# Patient Record
Sex: Male | Born: 1935 | Race: White | Hispanic: No | Smoking: Never smoker
Health system: Southern US, Community
[De-identification: ages and names within clinical notes are randomized; demographics above are authoritative.]

## PROBLEM LIST (undated history)

## (undated) DIAGNOSIS — Z79899 Other long term (current) drug therapy: Secondary | ICD-10-CM

## (undated) DIAGNOSIS — I495 Sick sinus syndrome: Secondary | ICD-10-CM

## (undated) DIAGNOSIS — I700 Atherosclerosis of aorta: Secondary | ICD-10-CM

## (undated) DIAGNOSIS — M150 Primary generalized (osteo)arthritis: Secondary | ICD-10-CM

## (undated) DIAGNOSIS — Z7901 Long term (current) use of anticoagulants: Secondary | ICD-10-CM

## (undated) DIAGNOSIS — R9439 Abnormal result of other cardiovascular function study: Secondary | ICD-10-CM

## (undated) DIAGNOSIS — E785 Hyperlipidemia, unspecified: Secondary | ICD-10-CM

## (undated) DIAGNOSIS — I483 Typical atrial flutter: Secondary | ICD-10-CM

## (undated) DIAGNOSIS — R42 Dizziness and giddiness: Secondary | ICD-10-CM

## (undated) DIAGNOSIS — M5136 Other intervertebral disc degeneration, lumbar region: Secondary | ICD-10-CM

## (undated) DIAGNOSIS — I2510 Atherosclerotic heart disease of native coronary artery without angina pectoris: Secondary | ICD-10-CM

## (undated) DIAGNOSIS — R7303 Prediabetes: Secondary | ICD-10-CM

## (undated) DIAGNOSIS — I70223 Atherosclerosis of native arteries of extremities with rest pain, bilateral legs: Secondary | ICD-10-CM

## (undated) DIAGNOSIS — I252 Old myocardial infarction: Secondary | ICD-10-CM

## (undated) DIAGNOSIS — K219 Gastro-esophageal reflux disease without esophagitis: Secondary | ICD-10-CM

## (undated) DIAGNOSIS — T50905A Adverse effect of unspecified drugs, medicaments and biological substances, initial encounter: Secondary | ICD-10-CM

## (undated) DIAGNOSIS — Z6831 Body mass index (BMI) 31.0-31.9, adult: Secondary | ICD-10-CM

## (undated) DIAGNOSIS — K56600 Partial intestinal obstruction, unspecified as to cause: Secondary | ICD-10-CM

## (undated) DIAGNOSIS — I10 Essential (primary) hypertension: Secondary | ICD-10-CM

## (undated) DIAGNOSIS — Q245 Malformation of coronary vessels: Secondary | ICD-10-CM

## (undated) DIAGNOSIS — Z01810 Encounter for preprocedural cardiovascular examination: Secondary | ICD-10-CM

## (undated) DIAGNOSIS — I25810 Atherosclerosis of coronary artery bypass graft(s) without angina pectoris: Secondary | ICD-10-CM

## (undated) DIAGNOSIS — I255 Ischemic cardiomyopathy: Secondary | ICD-10-CM

## (undated) DIAGNOSIS — K439 Ventral hernia without obstruction or gangrene: Secondary | ICD-10-CM

## (undated) DIAGNOSIS — Z951 Presence of aortocoronary bypass graft: Secondary | ICD-10-CM

## (undated) DIAGNOSIS — I452 Bifascicular block: Secondary | ICD-10-CM

## (undated) DIAGNOSIS — R931 Abnormal findings on diagnostic imaging of heart and coronary circulation: Secondary | ICD-10-CM

## (undated) DIAGNOSIS — J310 Chronic rhinitis: Secondary | ICD-10-CM

## (undated) DIAGNOSIS — R5381 Other malaise: Secondary | ICD-10-CM

## (undated) DIAGNOSIS — Z950 Presence of cardiac pacemaker: Secondary | ICD-10-CM

## (undated) DIAGNOSIS — K432 Incisional hernia without obstruction or gangrene: Secondary | ICD-10-CM

## (undated) DIAGNOSIS — Z9889 Other specified postprocedural states: Secondary | ICD-10-CM

## (undated) DIAGNOSIS — D6959 Other secondary thrombocytopenia: Secondary | ICD-10-CM

## (undated) DIAGNOSIS — R5383 Other fatigue: Secondary | ICD-10-CM

## (undated) DIAGNOSIS — R972 Elevated prostate specific antigen [PSA]: Secondary | ICD-10-CM

## (undated) HISTORY — DX: Prediabetes: R73.03

## (undated) HISTORY — DX: Malformation of coronary vessels: Q24.5

## (undated) HISTORY — PX: INSERT / REPLACE / REMOVE PACEMAKER: SUR710

## (undated) HISTORY — DX: Partial intestinal obstruction, unspecified as to cause: K56.600

## (undated) HISTORY — DX: Atherosclerosis of coronary artery bypass graft(s) without angina pectoris: I25.810

## (undated) HISTORY — DX: Body mass index (bmi) 31.0-31.9, adult: Z68.31

## (undated) HISTORY — PX: CORONARY ARTERY BYPASS GRAFT: SHX141

## (undated) HISTORY — DX: Old myocardial infarction: I25.2

## (undated) HISTORY — DX: Ventral hernia without obstruction or gangrene: K43.9

## (undated) HISTORY — DX: Atherosclerotic heart disease of native coronary artery without angina pectoris: I25.10

## (undated) HISTORY — PX: ATRIAL FLUTTER ABLATION: SHX5733

## (undated) HISTORY — PX: COLON SURGERY: SHX602

## (undated) HISTORY — DX: Gastro-esophageal reflux disease without esophagitis: K21.9

## (undated) HISTORY — DX: Other malaise: R53.81

## (undated) HISTORY — DX: Other long term (current) drug therapy: Z79.899

## (undated) HISTORY — DX: Encounter for preprocedural cardiovascular examination: Z01.810

## (undated) HISTORY — DX: Atherosclerosis of native arteries of extremities with rest pain, bilateral legs: I70.223

## (undated) HISTORY — DX: Other specified postprocedural states: Z98.890

## (undated) HISTORY — DX: Presence of cardiac pacemaker: Z95.0

## (undated) HISTORY — DX: Dizziness and giddiness: R42

## (undated) HISTORY — DX: Primary generalized (osteo)arthritis: M15.0

## (undated) HISTORY — DX: Long term (current) use of anticoagulants: Z79.01

## (undated) HISTORY — DX: Other secondary thrombocytopenia: D69.59

## (undated) HISTORY — DX: Adverse effect of unspecified drugs, medicaments and biological substances, initial encounter: T50.905A

## (undated) HISTORY — DX: Abnormal result of other cardiovascular function study: R94.39

## (undated) HISTORY — DX: Hyperlipidemia, unspecified: E78.5

## (undated) HISTORY — DX: Elevated prostate specific antigen (PSA): R97.20

## (undated) HISTORY — DX: Chronic rhinitis: J31.0

## (undated) HISTORY — DX: Abnormal findings on diagnostic imaging of heart and coronary circulation: R93.1

## (undated) HISTORY — DX: Atherosclerosis of aorta: I70.0

## (undated) HISTORY — DX: Presence of aortocoronary bypass graft: Z95.1

## (undated) HISTORY — DX: Typical atrial flutter: I48.3

## (undated) HISTORY — DX: Sick sinus syndrome: I49.5

## (undated) HISTORY — DX: Other intervertebral disc degeneration, lumbar region: M51.36

## (undated) HISTORY — DX: Other fatigue: R53.83

## (undated) HISTORY — DX: Bifascicular block: I45.2

## (undated) HISTORY — DX: Incisional hernia without obstruction or gangrene: K43.2

## (undated) HISTORY — DX: Essential (primary) hypertension: I10

---

## 1997-06-26 ENCOUNTER — Ambulatory Visit (HOSPITAL_BASED_OUTPATIENT_CLINIC_OR_DEPARTMENT_OTHER): Admission: RE | Admit: 1997-06-26 | Discharge: 1997-06-26 | Payer: Self-pay | Admitting: *Deleted

## 1997-10-20 ENCOUNTER — Ambulatory Visit (HOSPITAL_BASED_OUTPATIENT_CLINIC_OR_DEPARTMENT_OTHER): Admission: RE | Admit: 1997-10-20 | Discharge: 1997-10-20 | Payer: Self-pay | Admitting: *Deleted

## 2000-04-02 ENCOUNTER — Other Ambulatory Visit: Admission: RE | Admit: 2000-04-02 | Discharge: 2000-04-02 | Payer: Self-pay | Admitting: Urology

## 2005-02-17 ENCOUNTER — Ambulatory Visit: Payer: Self-pay | Admitting: Cardiology

## 2005-02-17 ENCOUNTER — Inpatient Hospital Stay (HOSPITAL_COMMUNITY): Admission: EM | Admit: 2005-02-17 | Discharge: 2005-02-22 | Payer: Self-pay | Admitting: Cardiology

## 2005-04-11 ENCOUNTER — Encounter: Admission: RE | Admit: 2005-04-11 | Discharge: 2005-04-11 | Payer: Self-pay | Admitting: Surgery

## 2006-05-01 ENCOUNTER — Ambulatory Visit: Payer: Self-pay | Admitting: Oncology

## 2010-07-29 NOTE — Discharge Summary (Signed)
NAMESENAI, KINGSLEY NO.:  [id number]   MEDICAL RECORD NO.:  [id number]          PATIENT TYPE:  INP   LOCATION:  2040                         FACILITY:  [facility]   PHYSICIAN:  Evelene Croon, M.D.     DATE OF BIRTH:  30-Jun-1933   DATE OF ADMISSION:  02/17/2005  DATE OF DISCHARGE:                                 DISCHARGE SUMMARY   PRIMARY DIAGNOSIS:  Left main and severe two vessel coronary artery disease  status post acute inferior apical myocardial infarction.   IN-HOSPITAL DIAGNOSIS:  1.  Volume overload.  2.  Hypotension.   SECONDARY DIAGNOSIS:  Gastroesophageal reflux disease.   ALLERGIES:  No known drug allergies.   IN-HOSPITAL OPERATION AND PROCEDURES:  1.  Cardiac catheterization and percutaneous coronary intervention.  2.  Emergent coronary artery bypass grafting x 4 using left internal mammary      artery graft to the left anterior descending coronary, saphenous vein      graft to the diagonal branch of the left anterior descending coronary      artery, sequential saphenous vein graft to the intermediate and obtuse      marginal branches of the left circumflex coronary, endovein harvesting      from the right leg.  3.  Interoperative transesophageal echocardiogram.   HOSPITAL COURSE:  The patient is a 77 year old previously healthy gentleman  who presented with a anterior apical myocardial infarction.  The patient was  taken emergently for cardiac catheterization by Dr. Juanda Chance on February 17, 2005.  Catheterization showed an ulcerated hazy 80% left main stenosis.  The  LAD was heavily diseased proximally and occluded in its proximal and mid  portion.  He underwent immediate angioplasty and stenting of the LAD.  This  lesion was adjacent to one of the diagonal branches and this was occluded by  the stent.  The LAD was successfully opened.  The patient had about 70-80%  left circumflex stenosis and mild narrowing of the proximal right coronary  artery  of about 30%.  Left ventricular ejection fraction was about 35% with  akinesis to dyskinesis of the distal anterior wall and apex.  The patient  remained pain free in the catheterization lab.  Following catheterization,  CVTS was consulted.  Dr. Laneta Simmers discussed with Dr. Juanda Chance and evaluated  films.  After a discussion with the patient about undergoing coronary artery  bypass grafting emergently for the tight stenosis in the left main, the  patient agreed.  Dr. Laneta Simmers discussed the risks and benefits of the patient.  The patient was taken urgently to the operating room where he underwent  emergent coronary artery bypass grafting x 4 using a left internal mammary  artery graft to the left anterior descending coronary, saphenous vein graft  to the diagonal branch of the left anterior descending coronary artery,  sequential saphenous vein graft to the intermediate and obtuse marginal  branches of the left circumflex coronary.  EVH harvesting from the right leg  was done.  The patient tolerated the procedure well and was transferred up  to the intensive care  unit in stable condition.  The patient was extubated  the evening of surgery.  He was seen to be hemodynamically stable  postoperatively.  Following extubation the patient was found to be  neurologically intact.  The patient was hypotensive postoperatively and  required Dopamine as well as neo-drips.  These were weaned off as tolerated  and able to be weaned off completely by postoperative day two.  The  remainder of the patient's postoperative course is pretty much unremarkable.  He did develop volume overload which he was started on diuretics for.  His  last weight was 196.4 pounds on postoperative day four which his  preoperative weight was 190 pounds.  Diuretics will be continued at  discharge.  The patient remained in normal sinus rhythm postoperatively.  He  was out of bed and ambulating well on his own.  He was able to be weaned off   oxygen saturating greater than 90% on room air prior to discharge.  His  incisions are dry, intact, and healing well.  The patient's appetite was  stable, no nausea or vomiting noted.  The patient denies any shortness of  breath postoperatively.  The patient continues to be hemodynamically stable  during hospital stay.  His creatinine was stable.  The patient is  tentatively ready for discharge home postoperative day five, February 22, 2005.   DISCHARGE INSTRUCTIONS:  A follow up appointment is scheduled with Dr.  Laneta Simmers for April 11, 2005, at 11:45 a.m.  The patient will need to obtain  a PA and lateral chest x-ray one hour prior to this appointment.  The  patient is to contact Dr. Ivin Poot office to schedule follow up appointment  with him in two weeks.  Mr. Tugwell received instructions on diet, activity  level, and incisional care.  He was told no driving until released to do so,  no heavy lifting over 10 pounds.  The patient is told he is allowed to  shower washing his incisions using soap and water.  He is to contact the  office if he develops any drainage or openings from any of his incision  sites or a fever of greater than 101.1 degrees.  The patient was told to  ambulate 3-4 times per day to progress as tolerated and to continue doing  his breathing exercises.  The patient was educated on low fat, low salt  diet.   DISCHARGE MEDICATIONS:  1.  Aspirin 325 mg p.o. daily.  2.  Toprol XL 25 mg p.o. daily.  3.  Altace 25 mg p.o. daily.  4.  Lipitor 80 mg p.o. q.h.s.  5.  Nexium as taken at home.  6.  Lasix 40 mg p.o. daily x 5 days.  7.  Potassium chloride 20 mEq p.o. daily x 5 days.  8.  Plavix 75 mg p.o. daily.  9.  Amiodarone 400 mg p.o. b.i.d. until March 07, 2005, then 200 mg p.o.      b.i.d.  10. Tylox 1-2 tablets p.o. q.4h. p.r.n. pain.      Theda Belfast, [place]      Evelene Croon, M.D.  Electronically Signed   KMD/MEDQ  D:  02/21/2005  T:  02/22/2005   Job:  [id number]   cc:   Heide Guile, MD  Fax: [phone]   Charlies Constable, M.D. [facility]  [address]  [address]  [place]  [place] [address]

## 2010-07-29 NOTE — Op Note (Signed)
NAME:  MAISEN, KLINGLER NO.:  [id number]   MEDICAL RECORD NO.:  [id number]          PATIENT TYPE:  INP   LOCATION:  2303                         FACILITY:  [facility]   PHYSICIAN:  Judie Petit, M.D. DATE OF BIRTH:  [birth year]   DATE OF PROCEDURE:  DATE OF DISCHARGE:                                 OPERATIVE REPORT   Vikram Tillett is a 77 year old patient of Dr. Amada Jupiter that underwent  emergent coronary artery bypass grafting today. This is a brief  transesophageal echocardiogram report post bypass.   Dr. Kipp Brood performed the pre-bypass transesophageal echocardiogram.   POST CORONARY ARTERY BYPASS GRAFTING:  The patient was taken off coronary artery bypass graft on the initial  attempt by Dr. Evelene Croon with Dr. Hart Robinsons present. Dr.  Gelene Mink mentioned that the left ventricle appeared to be contracting well,  there were no masses noted within the ventricle. The mitral valve appeared  to function appropriately. There was trace to 1+ mitral regurgitation in  flow on Doppler examination. There were no other significant findings on the  post bypass transesophageal echocardiogram report. The patient was  subsequently taken to the surgical intensive care unit in stable condition.           ______________________________  Judie Petit, M.D.     CE/MEDQ  D:  02/18/2005  T:  02/19/2005  Job:  [id number]

## 2010-07-29 NOTE — H&P (Signed)
[name], CISSE NO.:  [id number]   MEDICAL RECORD NO.:  [id number]          PATIENT TYPE:  INP   LOCATION:  2854                         FACILITY:  [facility]   PHYSICIAN:  Charlies Constable, M.D. [facility] DATE OF BIRTH:  [birth year]   DATE OF ADMISSION:  02/17/2005  DATE OF DISCHARGE:                                HISTORY & PHYSICAL   PRIMARY CARE PHYSICIAN:  Dr. Hermelinda Dellen in [place], [place].   PRIMARY CARDIOLOGIST:  Will be Dr. Gilford Raid Harsh.   CHIEF COMPLAINT:  Chest pain.   HISTORY OF PRESENT ILLNESS:  Mr. Harju is a 77 year old male with no history  of coronary artery disease.  He was awakened by chest pain at approximately  2 a.m. that radiated to both arms that reached a level of 10.  He did not  have any associated symptoms.  It eased off after about 30 minutes, and he  went back to sleep.  Then at 7:30 a.m. this morning he had a recurrence of  chest pain and saw his primary care physician, who found his  electrocardiogram abnormal and transported him to [facility].  There  he was treated with medications that include aspirin, nitroglycerin, heparin  and metoprolol, as well as Integrilin.  He was transferred urgently to [facility] for further evaluation and catheterization.   Mr. Kamer has never had this pain before.  He does not have a lot of medical  problems, but sees his primary care doctor for preventive care and gets his  cholesterol checked every year.  He is generally active without symptoms  prior to admission and feels that he eats a generally heart-healthy diet.  He has never had any symptoms like this before.   PAST MEDICAL HISTORY:  He has no history of hypertension, diabetes,  hyperlipidemia or family history of coronary artery disease or tobacco use.  He has occasional reflux symptoms.   PAST SURGICAL HISTORY:  He has had Dupuytren's contractures corrected in  both hands.   ALLERGIES:  No known drug allergies.   MEDICATIONS:  Nexium approximately one to two times a month.   SOCIAL HISTORY:  He lives alone in [place] and is retired from [employer].  He does not and has never abused alcohol, tobacco or drugs.  He  exercises regularly and eats a healthy diet.   FAMILY HISTORY:  His mother died at age 16.  His father died at age 53.  Neither one had heart disease.  He has no brothers or sisters with heart  disease.   REVIEW OF SYSTEMS:  He denies any fevers, chills, sweats or recent illness.  He has no hematemesis, hemoptysis or melena.  He had some dysphagia a couple  of days ago and took a Nexium without much effect, but this resolved and is  not the same as the pain which woke him last night, or that he had this  morning.  The review of systems is otherwise negative.   PHYSICAL EXAMINATION:  GENERAL:  He is a well-developed and well-nourished  white male, in no  acute distress.  HEENT:  Head is normocephalic and atraumatic.  Pupils equal, round, reactive  to light and accommodation.  Extraocular movements intact.  Sclerae clear.  Nose without discharge.  NECK:  There is no lymphadenopathy, thyromegaly, bruit or jugular venous  distention noted.  CARDIOVASCULAR:  His heart is regular in rate and rhythm with an S1 and S2.  Distal pulses are 2+.  LUNGS:  Clear to auscultation bilaterally.  SKIN:  No rashes or lesions are noted.  ABDOMEN:  Soft, nontender.  EXTREMITIES:  There is no cyanosis or edema noted.  MUSCULOSKELETAL:  There is no joint deformity or effusions noted.  NEUROLOGIC:  Alert and oriented.  Cranial nerves II-XII  are grossly intact.   Chest x-ray performed at [facility] shows no acute disease.   Electrocardiogram:  Sinus rhythm with a right bundle branch block and  anterolateral ST elevation in V2 through V6.   LABORATORY DATA:  Sodium 140, potassium 3.8, chloride 105, CO2 of 26, BUN  11, creatinine 1.1, glucose 118.  Hemoglobin 17.2, hematocrit 50.8, WBC's  8,  platelets 191.  INR 1, PTT 26.6.   IMPRESSION/PLAN:  1.  Acute anterolateral myocardial infarction:  Mr. Marchetta has been taken      urgently to the cardiac catheterization laboratory.  Further evaluation      and treatment will depend upon the results of the procedure.  2.  Hyperglycemia:  Check a hemoglobin A1c.  3.  Lipid status:  Start empiric Lipitor 80, and check a lipid profile in      the a.m.   Dr. Charlies Constable saw the patient and will determine the plan of care.      [name], P.A. [facility]    ______________________________  Charlies Constable, M.D. [facility]    RB/MEDQ  D:  02/17/2005  T:  02/17/2005  Job:  [id number]

## 2010-07-29 NOTE — Consult Note (Signed)
NAMEJACE, [name] NO.:  [id number]   MEDICAL RECORD NO.:  [id number]          PATIENT TYPE:  INP   LOCATION:  2399                         FACILITY:  [facility]   PHYSICIAN:  [name], M.D.     DATE OF BIRTH:  1933-06-14   DATE OF CONSULTATION:  02/17/2005  DATE OF DISCHARGE:                                   CONSULTATION   REFERRING PHYSICIAN:  Dr. Charlies Cameron.   REASON FOR CONSULTATION:  High-grade ulcerated left main stenosis with left  anterior descending occlusion, status post acute anterior MI.   HISTORY OF PRESENT ILLNESS:  This patient is a 77 year old previously  healthy gentleman who felt poorly yesterday evening.  He was awakened about  2 o'clock this morning with chest pain radiating into both arms that he  rated as 5/10.  His pain fluctuated over the next couple of hours and he  presented to his primary physician this morning and was sent to the  emergency room and transferred [facility] after electrocardiogram showed an  acute anterior MI.  His CPK and troponin enzymes are pending, but  electrocardiogram shows anterior ST elevation.  He was taken emergently to  the cath lab by Dr. Juanda Cameron and catheterization showed an 80% ulcerated left  main stenosis.  There was occlusion of the midportion of the LAD beyond the  second diagonal branch.  This was opened with a stent.  The left circumflex  also had about 70% mid-vessel stenosis.  The right coronary had  insignificant proximal irregularity with about 30% narrowing.  Left  ventricular ejection fraction was about 35% with akinesis-to-dyskinesis of  the distal anterior wall and apex.  His PA pressure was 46/21 with a wedge  pressure of 19.  There was no gradient across the aortic valve.  The right  atrial pressure was 10.  The patient was pain-free in the catheterization  lab.  After review of these films with Dr. Juanda Cameron, we felt that the patient  would require coronary artery bypass surgery and that  it should be performed  emergently, given the ulcerated hazy appearance of the left main stenosis.  I discussed the operative procedure with the patient and his girlfriend  including alternatives, benefits, and risks including bleeding, blood  transfusion, infection, stroke, myocardial infarction, graft failure, and  death.  He was on the [facility] and received an unknown blood thinner,  which was either Plavix or the study drug.  The patient was also on  Integrilin.   PAST MEDICAL HISTORY:  Negative for any medical diseases.  He denies  hypertension, diabetes, and hyperlipidemia.   PAST SURGICAL HISTORY:  Only significant for surgery on his hand.   MEDICATIONS:  He takes Nexium occasionally.   ALLERGIES:  None.   REVIEW OF SYSTEMS:  GENERAL:  He denies any fever or chills.  He has had no  recent weight changes.  He has had fatigue.  EYES:  Negative.  ENT:  Negative.  ENDOCRINE:  He denies diabetes and hypothyroidism.  CARDIOVASCULAR:  He had not had any chest pain until overnight about  2  o'clock in the morning.  He has had fatigue for several months.  He denies  any prior history of exertional dyspnea.  He has had no PND or orthopnea.  RESPIRATORY:  He denies cough and sputum production.  GI:  He denies nausea  and vomiting.  He has had no melena or bright red blood per rectum.  GU:  He  denies dysuria and hematuria.  NEUROLOGICAL:  He denies any focal weakness  or numbness.  He denies dizziness and syncope.  HEMATOLOGICAL:  Negative.  PSYCHIATRIC:  Negative.   SOCIAL HISTORY:  He lives in [place].  He is a retired Art gallery manager.  He has  never smoked and does not drink alcohol.   FAMILY HISTORY:  His mother died at age 39 with no history of coronary  disease.  His father died at age 69 with no history of coronary disease.  There is no history of coronary disease in any of his siblings.   PHYSICAL EXAMINATION:  VITAL SIGNS:  His blood pressure is 120/64.  Pulse is  65 and  regular.  Respiratory rate is 18 and unlabored.  GENERAL:  He is a well-developed white male in no distress.  HEENT:  Exam shows him to be normocephalic and atraumatic.  Pupils are equal  and reactive to light and accommodation.  Extraocular muscles are intact.  His throat is clear.  NECK:  Exam shows normal carotid pulses bilaterally.  There were no bruits.  There is no adenopathy or thyromegaly.  CARDIAC:  Exam shows a regular rate and rhythm with a normal S1 and S2.  There is no murmur, rub or gallop.  LUNGS:  Clear.  ABDOMEN:  Abdominal exam shows active bowel sounds.  His abdomen is soft and  nontender.  There are no palpable mass or organomegaly.  EXTREMITIES:  Extremity exam shows no peripheral edema.  Pedal pulses are  palpable bilaterally.  SKIN:  Warm and dry.  NEUROLOGIC:  Exam shows him to be alert and oriented x3.  Motor and sensory  exams are grossly normal.   IMPRESSION:  [name] has an ulcerated hazy left main stenosis with three-  vessel coronary artery disease including an acute anterior myocardial  infarction secondary left anterior descending occlusion.  I agree that  proceeding with coronary bypass surgery is the best treatment.  He will have  emergency coronary bypass surgery done in the next couple of hours.      [name], M.D.  Electronically Signed     BB/MEDQ  D:  02/17/2005  T:  02/18/2005  Job:  [id number]

## 2010-07-29 NOTE — Cardiovascular Report (Signed)
NAMEMARVENS, [name] NO.:  [id number]   MEDICAL RECORD NO.:  [id number]          PATIENT TYPE:  INP   LOCATION:  2399                         FACILITY:  [facility]   PHYSICIAN:  Charlies Constable, M.D. [facility] DATE OF BIRTH:  31-Jan-1934   DATE OF PROCEDURE:  02/17/2005  DATE OF DISCHARGE:                              CARDIAC CATHETERIZATION   PROCEDURE:  Cardiac catheterization and percutaneous coronary intervention.   CLINICAL IMPRESSION:  [name] is 77 years old and is retired and works  part-time in Aeronautical engineer. He has no prior history of known heart disease. He  had the onset of chest pain at 2 a.m. which was intermittent after that  time. He went to Dr. Anders Grant office where his ECG showed an acute infarct  and he was sent emergently to [facility] by ambulance and then  transferred to [place] via [facility] after being seen by Dr. Sylvie Farrier. The patient  was having minimal pain on arrival.   PROCEDURE:  The procedure was performed via the right femoral artery using  arterial sheath and 6-French preformed coronary catheters. A front wall  arterial punch was performed and Omnipaque contrast was used. After  completion of diagnostic studies we made decision to proceed with  intervention on the LAD.  We used a Q4 6-French guiding catheter with side  holes and a Prowater wire. There was a lesion in the left main and we had  some difficulty crossing the lesion with the wire. We then accomplished this  and we passed the wire across the totally occluded mid left anterior  descending artery into the distal vessel. We then used a 2.25 x 20-mm  Maverick balloon and dilated at the lesion with one inflation at 8  atmospheres per 30 seconds and then we moved the balloon down further in the  mid vessel and dilated the second lesion with one inflation of 8 atmospheres  by 30 seconds. We thought the patient would need surgery either urgently or  in the next few days for his left main  disease and we tried to get a  definitively result with a balloon dilatation. We back in with a  2.5 x 15-  mm Maverick and then performed one inflation up to 8 atmospheres for 45  seconds in the first lesion in the mid vessel. There was still marked recoil  in the vessel after this balloon inflation. While surgical consultation was  being obtained, we made a decision to place a stent in the LAD thinking that  this lesion was unstable whether the patient went to surgery today or in  several days. We stented this with a 2.5 x 12 mm mini-Vision stent deploying  this with one inflation at 12 atmospheres by 30 seconds. Repeat diagnostic  studies were then performed through the guiding catheter.   Following the intervention we passed a Swan-Ganz catheter via the right  femoral vein using the venous sheath to the pulmonary artery.   We obtained consultation with Dr. Laneta Simmers regarding surgery and decided that  it would be best to proceed with surgery today  thinking that the left main  lesion represented a ruptured plaque and was unstable. The patient had been  given Integrilin at the [facility], and we gave additional weighted  dose of heparin here to prolong the ACT greater than 200 seconds.  He was  enrolled in the [place] trial and was randomized to either Cangrelor study  drug or Plavix.  When we made a decision to go to surgery, we stopped the  study drug and stopped the Integrilin.   RESULTS:  1.  The left main coronary had an 80% narrowing in its mid portion. The      lesion was somewhat hazy and hypodense and had the appearance of a      ruptured plaque.  2.  The left anterior descending artery was completely occluded through its      mid portion after two diagonal branches and two septal perforators.      There was a 95% ostial stenosis in the second diagonal branch.  3.  The circumflex artery gave rise to dual ramus branches, two small      marginal branches, and a  posterolateral branch. There was 70% narrowing      in the mid portion of the vessel between two marginal branches.  4.  The right coronary artery is a moderate size vessel which gave a conus      branch, two right ventricular branches, a posterior descending branch,      and a posterolateral branch. There was a 30% narrowing in the proximal      right coronary artery.   LEFT VENTRICULOGRAM:  The left ventriculogram was obtained in the RAO  projection and showed akinesis to dyskinesis of the apex. The mid anterior  wall and anterior wall near the base and the mid inferior wall and inferior  wall near the base moved well. The ejection fraction was 35%.   Following stenting of the lesion in the mid portion of the LAD the stenosis  improved from 100% to 0% and the flow improved from TIMI-0 to TIMI-3 flow.  The flow in the second diagonal branch went from TIMI-3 flow to TIMI-2 flow.  This diagonal branch had the tight ostial stenosis and was crossed by the  stent.  The second lesion in the mid vessel improved from 80% to 30% with  balloon dilatation.   HEMODYNAMIC DATA:  The right atrial pressure was 10 mean. The right  ventricular pressure was 46/15. The pulmonary artery pressure was 46/21 with  a mean of 32. The pulmonary artery wedge pressure was 19. The __________  pressure was 116/34 and the aortic pressure 116/62 with a mean of 82.   CONCLUSION:  1.  Acute anterior wall myocardial infarction with 80% stenosis in the left      main coronary artery, total occlusion of the mid left anterior      descending artery with 95% stenosis in the second diagonal branch, 70%      stenosis in the mid circumflex artery, and 30% narrowing in the proximal      right coronary artery with apical wall akinesis to dyskinesis and an      estimated ejection fraction of 35%.  2.  Successful stenting of the lesion in the mid left anterior descending      with improvement in the sentinel narrowing from 100%  to 0% and     improvement in the flow from TIMI-0 to TIMI-3 flow using a bare metal  stent.  3.  Successful percutaneous transluminal coronary angioplasty of a second      lesion in the mid left anterior descending with improvement in the      sentinel narrowing from 80% to 30%.   DISPOSITION:  The patient underwent emergency reperfusion and stenting of  the LAD, but has residual left main disease which we feel is unstable and  probably represents a ruptured plaque. We apparently plan to take him to  emergency surgery with Dr. Laneta Simmers.   The patient had the onset of pain at 2 a.m., although it was intermittent.  He arrived at [facility] at 9:48 a.m. He arrived at [facility] cath  lab at 11:20 a.m. and the first balloon inflation was at 11:53 a.m. The  total balloon time was 2 hours and 5 minutes and the reperfusion time was 9  hours and 53 minutes.           ______________________________  Charlies Constable, M.D. [facility]     BB/MEDQ  D:  02/17/2005  T:  02/17/2005  Job:  [id number]   cc:   Feliciana Rossetti, MD  Fax: [phone]   Heide Guile, MD  Fax: [phone]

## 2015-02-14 DIAGNOSIS — I452 Bifascicular block: Secondary | ICD-10-CM

## 2015-02-14 DIAGNOSIS — Z01810 Encounter for preprocedural cardiovascular examination: Secondary | ICD-10-CM

## 2015-02-14 DIAGNOSIS — I2510 Atherosclerotic heart disease of native coronary artery without angina pectoris: Secondary | ICD-10-CM

## 2015-02-14 DIAGNOSIS — Q245 Malformation of coronary vessels: Secondary | ICD-10-CM

## 2015-02-14 DIAGNOSIS — I10 Essential (primary) hypertension: Secondary | ICD-10-CM

## 2015-02-14 DIAGNOSIS — Z950 Presence of cardiac pacemaker: Secondary | ICD-10-CM | POA: Insufficient documentation

## 2015-02-14 DIAGNOSIS — E785 Hyperlipidemia, unspecified: Secondary | ICD-10-CM

## 2015-02-14 DIAGNOSIS — I255 Ischemic cardiomyopathy: Secondary | ICD-10-CM | POA: Insufficient documentation

## 2015-02-14 HISTORY — DX: Hyperlipidemia, unspecified: E78.5

## 2015-02-14 HISTORY — DX: Encounter for preprocedural cardiovascular examination: Z01.810

## 2015-02-14 HISTORY — DX: Bifascicular block: I45.2

## 2015-02-14 HISTORY — DX: Malformation of coronary vessels: Q24.5

## 2015-02-14 HISTORY — DX: Presence of cardiac pacemaker: Z95.0

## 2015-02-14 HISTORY — DX: Essential (primary) hypertension: I10

## 2015-02-14 HISTORY — DX: Atherosclerotic heart disease of native coronary artery without angina pectoris: I25.10

## 2015-02-17 DIAGNOSIS — I495 Sick sinus syndrome: Secondary | ICD-10-CM

## 2015-02-17 HISTORY — DX: Sick sinus syndrome: I49.5

## 2015-05-05 DIAGNOSIS — I25810 Atherosclerosis of coronary artery bypass graft(s) without angina pectoris: Secondary | ICD-10-CM

## 2015-05-05 DIAGNOSIS — K439 Ventral hernia without obstruction or gangrene: Secondary | ICD-10-CM | POA: Insufficient documentation

## 2015-05-05 DIAGNOSIS — J310 Chronic rhinitis: Secondary | ICD-10-CM

## 2015-05-05 DIAGNOSIS — M150 Primary generalized (osteo)arthritis: Secondary | ICD-10-CM

## 2015-05-05 DIAGNOSIS — Z7901 Long term (current) use of anticoagulants: Secondary | ICD-10-CM

## 2015-05-05 DIAGNOSIS — R7303 Prediabetes: Secondary | ICD-10-CM

## 2015-05-05 DIAGNOSIS — E782 Mixed hyperlipidemia: Secondary | ICD-10-CM | POA: Insufficient documentation

## 2015-05-05 DIAGNOSIS — I2510 Atherosclerotic heart disease of native coronary artery without angina pectoris: Secondary | ICD-10-CM | POA: Insufficient documentation

## 2015-05-05 DIAGNOSIS — Z6829 Body mass index (BMI) 29.0-29.9, adult: Secondary | ICD-10-CM | POA: Insufficient documentation

## 2015-05-05 DIAGNOSIS — R5383 Other fatigue: Secondary | ICD-10-CM

## 2015-05-05 DIAGNOSIS — Z79899 Other long term (current) drug therapy: Secondary | ICD-10-CM | POA: Insufficient documentation

## 2015-05-05 DIAGNOSIS — K219 Gastro-esophageal reflux disease without esophagitis: Secondary | ICD-10-CM

## 2015-05-05 DIAGNOSIS — M8949 Other hypertrophic osteoarthropathy, multiple sites: Secondary | ICD-10-CM

## 2015-05-05 DIAGNOSIS — R5381 Other malaise: Secondary | ICD-10-CM | POA: Insufficient documentation

## 2015-05-05 DIAGNOSIS — I483 Typical atrial flutter: Secondary | ICD-10-CM

## 2015-05-05 DIAGNOSIS — I25119 Atherosclerotic heart disease of native coronary artery with unspecified angina pectoris: Secondary | ICD-10-CM

## 2015-05-05 DIAGNOSIS — M159 Polyosteoarthritis, unspecified: Secondary | ICD-10-CM

## 2015-05-05 DIAGNOSIS — Z6831 Body mass index (BMI) 31.0-31.9, adult: Secondary | ICD-10-CM

## 2015-05-05 DIAGNOSIS — M5136 Other intervertebral disc degeneration, lumbar region: Secondary | ICD-10-CM

## 2015-05-05 DIAGNOSIS — R972 Elevated prostate specific antigen [PSA]: Secondary | ICD-10-CM

## 2015-05-05 DIAGNOSIS — M51369 Other intervertebral disc degeneration, lumbar region without mention of lumbar back pain or lower extremity pain: Secondary | ICD-10-CM | POA: Insufficient documentation

## 2015-05-05 HISTORY — DX: Other intervertebral disc degeneration, lumbar region: M51.36

## 2015-05-05 HISTORY — DX: Other hypertrophic osteoarthropathy, multiple sites: M89.49

## 2015-05-05 HISTORY — DX: Elevated prostate specific antigen (PSA): R97.20

## 2015-05-05 HISTORY — DX: Atherosclerotic heart disease of native coronary artery without angina pectoris: I25.10

## 2015-05-05 HISTORY — DX: Mixed hyperlipidemia: E78.2

## 2015-05-05 HISTORY — DX: Chronic rhinitis: J31.0

## 2015-05-05 HISTORY — DX: Polyosteoarthritis, unspecified: M15.9

## 2015-05-05 HISTORY — DX: Other intervertebral disc degeneration, lumbar region without mention of lumbar back pain or lower extremity pain: M51.369

## 2015-05-05 HISTORY — DX: Atherosclerotic heart disease of native coronary artery with unspecified angina pectoris: I25.119

## 2015-05-05 HISTORY — DX: Long term (current) use of anticoagulants: Z79.01

## 2015-05-05 HISTORY — DX: Ventral hernia without obstruction or gangrene: K43.9

## 2015-05-05 HISTORY — DX: Prediabetes: R73.03

## 2015-05-05 HISTORY — DX: Primary generalized (osteo)arthritis: M15.0

## 2015-05-05 HISTORY — DX: Body mass index (BMI) 31.0-31.9, adult: Z68.31

## 2015-05-05 HISTORY — DX: Atherosclerosis of coronary artery bypass graft(s) without angina pectoris: I25.810

## 2015-05-05 HISTORY — DX: Typical atrial flutter: I48.3

## 2015-05-05 HISTORY — DX: Body mass index (BMI) 29.0-29.9, adult: Z68.29

## 2015-05-05 HISTORY — DX: Gastro-esophageal reflux disease without esophagitis: K21.9

## 2015-05-05 HISTORY — DX: Other malaise: R53.81

## 2015-05-05 HISTORY — DX: Other long term (current) drug therapy: Z79.899

## 2016-02-16 DIAGNOSIS — I700 Atherosclerosis of aorta: Secondary | ICD-10-CM

## 2016-02-16 HISTORY — DX: Atherosclerosis of aorta: I70.0

## 2016-09-15 DIAGNOSIS — I252 Old myocardial infarction: Secondary | ICD-10-CM

## 2016-09-15 DIAGNOSIS — R9439 Abnormal result of other cardiovascular function study: Secondary | ICD-10-CM

## 2016-09-15 DIAGNOSIS — Z9889 Other specified postprocedural states: Secondary | ICD-10-CM | POA: Insufficient documentation

## 2016-09-15 DIAGNOSIS — Z951 Presence of aortocoronary bypass graft: Secondary | ICD-10-CM

## 2016-09-15 HISTORY — DX: Other specified postprocedural states: Z98.890

## 2016-09-15 HISTORY — DX: Old myocardial infarction: I25.2

## 2016-09-15 HISTORY — DX: Presence of aortocoronary bypass graft: Z95.1

## 2016-09-15 HISTORY — DX: Abnormal result of other cardiovascular function study: R94.39

## 2016-10-05 DIAGNOSIS — R42 Dizziness and giddiness: Secondary | ICD-10-CM

## 2016-10-05 HISTORY — DX: Dizziness and giddiness: R42

## 2017-03-05 DIAGNOSIS — K56600 Partial intestinal obstruction, unspecified as to cause: Secondary | ICD-10-CM | POA: Insufficient documentation

## 2017-03-05 DIAGNOSIS — Z8719 Personal history of other diseases of the digestive system: Secondary | ICD-10-CM

## 2017-03-05 DIAGNOSIS — K432 Incisional hernia without obstruction or gangrene: Secondary | ICD-10-CM

## 2017-03-05 HISTORY — DX: Personal history of other diseases of the digestive system: Z87.19

## 2017-03-05 HISTORY — DX: Partial intestinal obstruction, unspecified as to cause: K56.600

## 2017-03-05 HISTORY — DX: Incisional hernia without obstruction or gangrene: K43.2

## 2017-07-16 DIAGNOSIS — D6959 Other secondary thrombocytopenia: Secondary | ICD-10-CM

## 2017-07-16 DIAGNOSIS — T50905A Adverse effect of unspecified drugs, medicaments and biological substances, initial encounter: Secondary | ICD-10-CM

## 2017-07-16 HISTORY — DX: Other secondary thrombocytopenia: T50.905A

## 2017-07-16 HISTORY — DX: Other secondary thrombocytopenia: D69.59

## 2017-10-22 ENCOUNTER — Encounter: Payer: Self-pay | Admitting: *Deleted

## 2017-11-09 ENCOUNTER — Encounter: Payer: Medicare Other | Admitting: Cardiology

## 2017-11-20 NOTE — Progress Notes (Signed)
Cardiology Office Note:    Date:  11/21/2017   ID:  [name], DOB March 03, 1934, MRN [id number]  PCP:  Krystal Clark, NP  Cardiologist:  Norman Herrlich, MD    Referring MD: No ref. provider found    ASSESSMENT:    1. Coronary artery disease involving native coronary artery of native heart with angina pectoris (HCC)   2. Essential hypertension   3. Hyperlipidemia, unspecified hyperlipidemia type   4. Presence of cardiac pacemaker   5. Typical atrial flutter (HCC)   6. Sick sinus syndrome (HCC)   7. Frequent PVCs    PLAN:    In order of problems listed above:  1. Stable having no anginal discomfort in some fashion he is not taking aspirin he will restart 81 mg daily he is statin intolerant I asked him to start Zetia 10 mg daily for lipid-lowering therapy goal LDL less than 70 follow-up lipids in 1 month.  I will get a copies of his perfusion study from last year and I do not think he requires an ischemia evaluation at this time 2. Stable he previously was on a beta-blocker I will not start him on antihypertensive therapy 3. Poorly controlled statin intolerance start Zetia if unable to tolerate PCSK9 is appropriate 4. I am concerned that his symptoms are in some way related to rhythm either pacemaker programming or underlying ventricular or atrial arrhythmia.  He has a history of atrial flutter and has had a successful ablation Dr. Jarrett Ables note says that he is short episodes of ventricular tachycardia and is in biventricular bigeminy today.  Potentially this is the mechanism for not feeling well and he may benefit from antiarrhythmic therapy such as amiodarone if he has a high ventricular ectopy burden on device interrogation.  He is referred to EP in my practice. 5. Stable no clinical recurrence 6. Stable he has a pacemaker but we need to interrogate the device is side the burden of arrhythmia and whether he requires antiarrhythmic drug therapy. 7. Await EP consultation  device download may require antiarrhythmic therapy if he is a high ventricular ectopy burden as it may be the mechanism for his weakness.   Next appointment: 6 months with me he was referred to EP consultation and pacemaker device clinic   Medication Adjustments/Labs and Tests Ordered: Current medicines are reviewed at length with the patient today.  Concerns regarding medicines are outlined above.  Orders Placed This Encounter  Procedures  . Lipid Profile  . Ambulatory referral to Cardiac Electrophysiology  . EKG 12-Lead   Meds ordered this encounter  Medications  . aspirin EC 81 MG tablet    Sig: Take 1 tablet (81 mg total) by mouth daily.    Dispense:  90 tablet    Refill:  3  . ezetimibe (ZETIA) 10 MG tablet    Sig: Take 1 tablet (10 mg total) by mouth daily.    Dispense:  30 tablet    Refill:  3    Chief Complaint  Patient presents with  . Follow-up    to re establish cardiology care  . Coronary Artery Disease  . Atrial Flutter  . Hypertension  . Hyperlipidemia    History of Present Illness:    [name] is a 84 y.o. male with a hx of CAD EF 45 %, CABG in 2006 with LTA  SVG to D1  and second SVG sequential to RCA and Marinal, atrial flutter with EP ablation in 2014, RBBB, VT and a  pacemaker. Compliance with diet, lifestyle and medications: Yes  He is seen by me today to reestablish cardiology care last seen by me 02/15/2015.  He last cardiology care is seen by Dr. Sampson Goon [facility] aspirin July 2018.  Prior to that he was pacemaker reprogrammed and ever since then he does not feel well he complains of being unsteady and lightheaded.  In some fashion he has fallen from being followed in our device clinic although he is telemetry set up at home he is predominantly limited by back and radicular pain and is not having shortness of breath chest pain palpitation or syncope.  He underwent both echocardiogram and myocardial perfusion study from Dr.  Jarrett Ables note he had no ischemia he had variable ejection fractions but a decision was made that 45% was the true number.  I requested those records.  He has no pacemaker awareness syncope or TIA. Past Medical History:  Diagnosis Date  . Abnormal myocardial perfusion study 09/15/2016  . Anticoagulant long-term use 05/05/2015  . Aortic atherosclerosis (HCC) 02/16/2016  . BMI 31.0-31.9,adult 05/05/2015  . CAD in native artery 02/14/2015  . Chronic rhinitis 05/05/2015  . Coronary artery abnormality 02/14/2015  . Coronary artery disease involving coronary bypass graft of native heart without angina pectoris 05/05/2015  . Dizziness 10/05/2016  . Elevated PSA 05/05/2015   [name]  . Essential hypertension 02/14/2015  . GERD (gastroesophageal reflux disease) 05/05/2015  . High risk medication use 05/05/2015  . History of cardiac radiofrequency ablation 09/15/2016  . Hx of CABG 09/15/2016  . Hyperlipidemia 02/14/2015  . Lumbar degenerative disc disease 05/05/2015  . Malaise and fatigue 05/05/2015   Last Assessment & Plan:  Relevant Hx: Course: Daily Update: Today's Plan:he admits that this can be prominent for him and he had UA checked to ensure not having infection which he was concerned about and reassured him of this, encouraged him to remain active and his meds contribute to this as well which he is aware of   Electronically signed by: Krystal Clark, NP 08/12/15 1303  . Old MI (myocardial infarction) 09/15/2016  . Partial small bowel obstruction (HCC) 03/05/2017  . Prediabetes 05/05/2015  . Preoperative cardiovascular examination 02/14/2015  . Presence of cardiac pacemaker 02/14/2015   For symptomatic sinus bradycardia after flutter ablation For symptomatic sinus bradycardia after flutter ablation  . Primary osteoarthritis involving multiple joints 05/05/2015   Last Assessment & Plan:  Relevant Hx: Course: Daily Update: Today's Plan:he has known OA to his joints and he has seen chiropractor in the past  for this and he states that he was told he had degenerative changes to his back several times. The knee needs to be xrayed to the right with the swelling , he is taking the oxycodone only as needed but it works well he does not feel dizzy whereas the aleve  . RBBB with left anterior fascicular block 02/14/2015  . Recurrent incisional hernia 03/05/2017  . SSS (sick sinus syndrome) (HCC) 02/17/2015  . Thrombocytopenia due to drugs 07/16/2017  . Typical atrial flutter (HCC) 05/05/2015   Last Assessment & Plan:  Warfarin was stopped September 07, 2016 by physicians assistant or [place] [facility] after consistent monitoring showed no recurrence of A. fib for more than 1-1/2 years  . Ventral hernia without obstruction or gangrene 05/05/2015    Past Surgical History:  Procedure Laterality Date  . ATRIAL FLUTTER ABLATION    . COLON SURGERY    . CORONARY ARTERY BYPASS GRAFT    .  INSERT / REPLACE / REMOVE PACEMAKER      Current Medications: Current Meds  Medication Sig  . Multiple Vitamin (MULTI-VITAMINS) TABS Take 1 tablet by mouth daily.  . Omega-3 1000 MG CAPS Take 2 g by mouth daily.  Marland Kitchen omeprazole (PRILOSEC) 20 MG capsule Take 1 capsule by mouth daily.  Marland Kitchen oxyCODONE-acetaminophen (PERCOCET/ROXICET) 5-325 MG tablet Take 1 tablet by mouth every 8 (eight) hours as needed for severe pain.     Allergies:   Amiodarone; Doxycycline; Gabapentin; Metoprolol; Neomycin; Niacin; Ramipril; and Statins   Social History   Socioeconomic History  . Marital status: Divorced    Spouse name: Not on file  . Number of children: Not on file  . Years of education: Not on file  . Highest education level: Not on file  Occupational History  . Not on file  Social Needs  . Financial resource strain: Not on file  . Food insecurity:    Worry: Not on file    Inability: Not on file  . Transportation needs:    Medical: Not on file    Non-medical: Not on file  Tobacco Use  . Smoking status: Never Smoker  . Smokeless  tobacco: Never Used  Substance and Sexual Activity  . Alcohol use: Never    Frequency: Never  . Drug use: Never  . Sexual activity: Not on file  Lifestyle  . Physical activity:    Days per week: Not on file    Minutes per session: Not on file  . Stress: Not on file  Relationships  . Social connections:    Talks on phone: Not on file    Gets together: Not on file    Attends religious service: Not on file    Active member of club or organization: Not on file    Attends meetings of clubs or organizations: Not on file    Relationship status: Not on file  Other Topics Concern  . Not on file  Social History Narrative  . Not on file     Family History: The patient's family history includes AAA (abdominal aortic aneurysm) in his daughter; Diabetes in his daughter and sister; Hyperlipidemia in his daughter; Hypertension in his daughter and daughter; Migraines in his daughter; Rheum arthritis in his daughter. There is no history of Heart attack, Heart disease, or Heart failure. ROS:   Please see the history of present illness.    All other systems reviewed and are negative.  EKGs/Labs/Other Studies Reviewed:    The following studies were reviewed today:  EKG:  EKG ordered today.  The ekg ordered today demonstrates sinus rhythm bifascicular heart block ventricular bigeminy  Recent Labs:   10/17/17 CMP TSH normal LDL 99 Chol 147 HDL 38 No results found for requested labs within last 8760 hours.  Recent Lipid Panel No results found for: CHOL, TRIG, HDL, CHOLHDL, VLDL, LDLCALC, LDLDIRECT  Physical Exam:    VS:  BP 140/86 (BP Location: Right Arm, Patient Position: Sitting, Cuff Size: Normal)   Pulse 76   Ht 5\' 10"  (1.778 m)   Wt 202 lb 9.6 oz (91.9 kg)   SpO2 97%   BMI 29.07 kg/m     Wt Readings from Last 3 Encounters:  11/21/17 202 lb 9.6 oz (91.9 kg)     GEN: He has a profound change in his appearance of a known him years ago when he [place] every [place] when  I was there exercising and he has become very  much of debilitated frail man in no acute distress HEENT: Normal NECK: No JVD; No carotid bruits LYMPHATICS: No lymphadenopathy CARDIAC: RRR, no murmurs, rubs, gallops RESPIRATORY:  Clear to auscultation without rales, wheezing or rhonchi  ABDOMEN: Soft, non-tender, non-distended MUSCULOSKELETAL:  No edema; No deformity  SKIN: Warm and dry NEUROLOGIC:  Alert and oriented x 3 PSYCHIATRIC:  Normal affect    Signed, Norman Herrlich, MD  11/21/2017 10:50 AM    [facility]

## 2017-11-21 ENCOUNTER — Ambulatory Visit (INDEPENDENT_AMBULATORY_CARE_PROVIDER_SITE_OTHER): Payer: Medicare Other | Admitting: Cardiology

## 2017-11-21 ENCOUNTER — Encounter: Payer: Self-pay | Admitting: Cardiology

## 2017-11-21 VITALS — BP 140/86 | HR 76 | Ht 70.0 in | Wt 202.6 lb

## 2017-11-21 DIAGNOSIS — I25119 Atherosclerotic heart disease of native coronary artery with unspecified angina pectoris: Secondary | ICD-10-CM | POA: Diagnosis not present

## 2017-11-21 DIAGNOSIS — Z950 Presence of cardiac pacemaker: Secondary | ICD-10-CM | POA: Diagnosis not present

## 2017-11-21 DIAGNOSIS — I493 Ventricular premature depolarization: Secondary | ICD-10-CM

## 2017-11-21 DIAGNOSIS — I10 Essential (primary) hypertension: Secondary | ICD-10-CM

## 2017-11-21 DIAGNOSIS — I495 Sick sinus syndrome: Secondary | ICD-10-CM

## 2017-11-21 DIAGNOSIS — E785 Hyperlipidemia, unspecified: Secondary | ICD-10-CM | POA: Diagnosis not present

## 2017-11-21 DIAGNOSIS — I483 Typical atrial flutter: Secondary | ICD-10-CM

## 2017-11-21 MED ORDER — EZETIMIBE 10 MG PO TABS: 10.0000 mg | ORAL_TABLET | Freq: Every day | ORAL | 3 refills | Status: DC

## 2017-11-21 MED ORDER — ASPIRIN EC 81 MG PO TBEC: 81.0000 mg | DELAYED_RELEASE_TABLET | Freq: Every day | ORAL | 3 refills | Status: DC

## 2017-11-21 NOTE — Patient Instructions (Addendum)
Medication Instructions:  Your physician has recommended you make the following change in your medication:   START aspirin 81 mg daily START ezetimide (zetia) 10 mg daily  Labwork: Your physician recommends that you return for lab work in 1 month: lipid panel.   Testing/Procedures: You had an EKG today  Follow-Up: Your physician wants you to follow-up in: 6 months. You will receive a reminder letter in the mail two months in advance. If you don't receive a letter, please call our office to schedule the follow-up appointment.  You will be scheduled to see Dr Elberta Fortis in [place] at our [facility] location 11-30-17 @ 830am.   If you need a refill on your cardiac medications before your next appointment, please call your pharmacy.   Thank you for choosing [facility]! Mady Gemma, RN [phone]

## 2017-11-29 NOTE — Progress Notes (Signed)
Electrophysiology Office Note   Date:  11/30/2017   ID:  [name], DOB 1933/11/27, MRN [id number]  PCP:  Krystal Clark, NP  Cardiologist:  Dulce Sellar Primary Electrophysiologist:  Regan Lemming, MD    No chief complaint on file.    History of Present Illness: [name] is a 84 y.o. male who is being seen today for the evaluation of VT at the request of Norman Herrlich. Presenting today for electrophysiology evaluation.  He has a history of coronary disease, ischemic cardiomyopathy with an EF of 25%, CABG in 2006, atrial flutter status post ablation in 2014, VT, and a pacemaker implanted for sick sinus syndrome.  2006, he had an anterior MI.  He underwent emergent intervention on the LAD but subsequently underwent CABG with LIMA to the LAD, vein to the first diagonal and vein to the RCA.  2014 he began to have palpitations and was noted to have atrial flutter and is now status post atrial flutter ablation.  He had significant bradycardia and thus pacemaker was implanted.  Notes from 2018 show an echo with an ejection fraction of 40 to 45%.    Today, he denies symptoms of palpitations, chest pain, shortness of breath, orthopnea, PND, lower extremity edema, claudication, presyncope, syncope, bleeding, or neurologic sequela. The patient is tolerating medications without difficulties.  His main symptoms today are of dizziness.  He is dizzy most of the time.  He feels like his body is spinning in the room.  He does not have palpitations or any awareness of his pacemaker.  He does say that when his pacemaker rate was increased 2 years ago to 70-75, his dizziness got worse.  He does have PVCs, but it looks like his PVC burden is less than 1%.  He has had some short episodes of what appears to be atrial fibrillation, and has 1% PVCs on his pacemaker.   Past Medical History:  Diagnosis Date  . Abnormal myocardial perfusion study 09/15/2016  . Anticoagulant long-term use 05/05/2015    . Aortic atherosclerosis (HCC) 02/16/2016  . BMI 31.0-31.9,adult 05/05/2015  . CAD in native artery 02/14/2015  . Chronic rhinitis 05/05/2015  . Coronary artery abnormality 02/14/2015  . Coronary artery disease involving coronary bypass graft of native heart without angina pectoris 05/05/2015  . Dizziness 10/05/2016  . Elevated PSA 05/05/2015   [name]  . Essential hypertension 02/14/2015  . GERD (gastroesophageal reflux disease) 05/05/2015  . High risk medication use 05/05/2015  . History of cardiac radiofrequency ablation 09/15/2016  . Hx of CABG 09/15/2016  . Hyperlipidemia 02/14/2015  . Lumbar degenerative disc disease 05/05/2015  . Malaise and fatigue 05/05/2015   Last Assessment & Plan:  Relevant Hx: Course: Daily Update: Today's Plan:he admits that this can be prominent for him and he had UA checked to ensure not having infection which he was concerned about and reassured him of this, encouraged him to remain active and his meds contribute to this as well which he is aware of   Electronically signed by: Krystal Clark, NP 08/12/15 1303  . Old MI (myocardial infarction) 09/15/2016  . Partial small bowel obstruction (HCC) 03/05/2017  . Prediabetes 05/05/2015  . Preoperative cardiovascular examination 02/14/2015  . Presence of cardiac pacemaker 02/14/2015   For symptomatic sinus bradycardia after flutter ablation For symptomatic sinus bradycardia after flutter ablation  . Primary osteoarthritis involving multiple joints 05/05/2015   Last Assessment & Plan:  Relevant Hx: Course: Daily Update: Today's Plan:he has known OA  to his joints and he has seen chiropractor in the past for this and he states that he was told he had degenerative changes to his back several times. The knee needs to be xrayed to the right with the swelling , he is taking the oxycodone only as needed but it works well he does not feel dizzy whereas the aleve  . RBBB with left anterior fascicular block 02/14/2015  . Recurrent  incisional hernia 03/05/2017  . SSS (sick sinus syndrome) (HCC) 02/17/2015  . Thrombocytopenia due to drugs 07/16/2017  . Typical atrial flutter (HCC) 05/05/2015   Last Assessment & Plan:  Warfarin was stopped September 07, 2016 by physicians assistant or [place] cardiology after consistent monitoring showed no recurrence of A. fib for more than 1-1/2 years  . Ventral hernia without obstruction or gangrene 05/05/2015   Past Surgical History:  Procedure Laterality Date  . ATRIAL FLUTTER ABLATION    . COLON SURGERY    . CORONARY ARTERY BYPASS GRAFT    . INSERT / REPLACE / REMOVE PACEMAKER       Current Outpatient Medications  Medication Sig Dispense Refill  . aspirin EC 81 MG tablet Take 1 tablet (81 mg total) by mouth daily. 90 tablet 3  . ezetimibe (ZETIA) 10 MG tablet Take 1 tablet (10 mg total) by mouth daily. 30 tablet 3  . Multiple Vitamin (MULTI-VITAMINS) TABS Take 1 tablet by mouth daily.    . Omega-3 1000 MG CAPS Take 2 g by mouth daily.    Marland Kitchen. omeprazole (PRILOSEC) 20 MG capsule Take 1 capsule by mouth daily.    Marland Kitchen. oxyCODONE-acetaminophen (PERCOCET/ROXICET) 5-325 MG tablet Take 1 tablet by mouth every 8 (eight) hours as needed for severe pain.     No current facility-administered medications for this visit.     Allergies:   Amiodarone; Doxycycline; Gabapentin; Metoprolol; Neomycin; Niacin; Ramipril; and Statins   Social History:  The patient  reports that he has never smoked. He has never used smokeless tobacco. He reports that he does not drink alcohol or use drugs.   Family History:  The patient's family history includes AAA (abdominal aortic aneurysm) in his daughter; Diabetes in his daughter and sister; Hyperlipidemia in his daughter; Hypertension in his daughter and daughter; Migraines in his daughter; Rheum arthritis in his daughter.    ROS:  Please see the history of present illness.   Otherwise, review of systems is positive for none.   All other systems are reviewed and  negative.    PHYSICAL EXAM: VS:  BP (!) 146/82   Pulse 76   Ht 5\' 10"  (1.778 m)   Wt 200 lb (90.7 kg)   BMI 28.70 kg/m  , BMI Body mass index is 28.7 kg/m. GEN: Well nourished, well developed, in no acute distress  HEENT: normal  Neck: no JVD, carotid bruits, or masses Cardiac: RRR; no murmurs, rubs, or gallops,no edema  Respiratory:  clear to auscultation bilaterally, normal work of breathing GI: soft, nontender, nondistended, + BS MS: no deformity or atrophy  Skin: warm and dry, device pocket is well healed Neuro:  Strength and sensation are intact Psych: euthymic mood, full affect  EKG:  EKG is not ordered today. Personal review of the ekg ordered 11/21/17 shows atrial paced, PVCs, right bundle branch block, left anterior fascicular block   Device interrogation is reviewed today in detail.  See PaceArt for details.   Recent Labs: No results found for requested labs within last 8760 hours.  Lipid Panel  No results found for: CHOL, TRIG, HDL, CHOLHDL, VLDL, LDLCALC, LDLDIRECT   Wt Readings from Last 3 Encounters:  11/30/17 200 lb (90.7 kg)  11/21/17 202 lb 9.6 oz (91.9 kg)      Other studies Reviewed: Additional studies/ records that were reviewed today include: epic notes    ASSESSMENT AND PLAN:  1.  Coronary artery disease with chronic stable angina: Current chest pain.  He is status post three-vessel CABG years ago.  Continue per primary cardiology  2.  Hypertension: Blood pressure is elevated today.  With his history of ischemic cardiomyopathy, we Sissy Goetzke start him on Coreg 6.25 mg and losartan 25 mg today.  3.  Hyperlipidemia: Per primary cardiology  4.  Sick sinus syndrome: Status post Biotronik pacemaker.  5.  Typical atrial flutter: Not anticoagulated.  Status post ablation without recurrence.  6.  PVCs: Based on device interrogation, he has having 1% PVCs.  And planning to start him on losartan and carvedilol which may help improve his PVC burden.  We  are also changing his device settings back to where they were 2 years ago as he distinctly remembers feeling different after his rate was increased to 75 bpm.  This may cause an increase in his PVC burden.  Would order a 48-hour monitor for a better determination of his burden if his PVCs do increase.  7.  Dizziness: It is unclear to me as to the cause of his dizziness.  He is not having much in the way of arrhythmias that would cause this, but I am unclear of his overall cardiac function.  We Katriel Cutsforth get the results of his echocardiogram from his prior cardiologist.  I have changed his base rate down to 60 bpm, and have turned on CLS.  I am unsure if this Munirah Doerner help improve his dizziness.  He is dizzy most of the time, and it is worse when he is moving his head.  This is less likely to be cardiac.  We Isamar Nazir discuss this with his primary physician.    Current medicines are reviewed at length with the patient today.   The patient does not have concerns regarding his medicines.  The following changes were made today: Start Coreg, losartan  Labs/ tests ordered today include:  No orders of the defined types were placed in this encounter.  Case discussed with primary cardiology  Disposition:   FU with Baillie Mohammad 6 months  Signed, Aundra Pung Jorja Loa, MD  11/30/2017 8:31 AM     [facility] [address] [place] [address] [phone] (office) [phone] (fax)

## 2017-11-30 ENCOUNTER — Encounter: Payer: Self-pay | Admitting: Cardiology

## 2017-11-30 ENCOUNTER — Ambulatory Visit: Payer: Medicare Other | Admitting: Cardiology

## 2017-11-30 VITALS — BP 146/82 | HR 76 | Ht 70.0 in | Wt 200.0 lb

## 2017-11-30 DIAGNOSIS — I25708 Atherosclerosis of coronary artery bypass graft(s), unspecified, with other forms of angina pectoris: Secondary | ICD-10-CM | POA: Diagnosis not present

## 2017-11-30 DIAGNOSIS — I483 Typical atrial flutter: Secondary | ICD-10-CM | POA: Diagnosis not present

## 2017-11-30 DIAGNOSIS — I10 Essential (primary) hypertension: Secondary | ICD-10-CM | POA: Diagnosis not present

## 2017-11-30 DIAGNOSIS — E785 Hyperlipidemia, unspecified: Secondary | ICD-10-CM | POA: Diagnosis not present

## 2017-11-30 DIAGNOSIS — I493 Ventricular premature depolarization: Secondary | ICD-10-CM

## 2017-11-30 DIAGNOSIS — Z79899 Other long term (current) drug therapy: Secondary | ICD-10-CM

## 2017-11-30 MED ORDER — LOSARTAN POTASSIUM 25 MG PO TABS: 25.0000 mg | ORAL_TABLET | Freq: Every day | ORAL | 6 refills | Status: DC

## 2017-11-30 MED ORDER — CARVEDILOL 6.25 MG PO TABS: 6.2500 mg | ORAL_TABLET | Freq: Two times a day (BID) | ORAL | 6 refills | Status: DC

## 2017-11-30 NOTE — Patient Instructions (Addendum)
Medication Instructions:  Your physician has recommended you make the following change in your medication: 1. START Carvedilol 6.25 mg two times a day 2. START Losartan 25 mg once daily  * If you need a refill on your cardiac medications before your next appointment, please call your pharmacy.   Labwork: Please go by the [facility] office in several weeks for a BMET lab. You may stop by anytime during business hours. You do not need to be fasting.  Testing/Procedures: None ordered  Follow-Up: Remote monitoring is used to monitor your Pacemaker of ICD from home. This monitoring reduces the number of office visits required to check your device to one time per year. It allows [place] to keep an eye on the functioning of your device to ensure it is working properly. You are scheduled for a device check from home on 03/04/2018. You may send your transmission at any time that day. If you have a wireless device, the transmission will be sent automatically. After your physician reviews your transmission, you will receive a postcard with your next transmission date.   Your physician wants you to follow-up in: 6 months with Dr. Elberta Fortis in [place].  You will receive a reminder letter in the mail two months in advance. If you don't receive a letter, please call our office to schedule the follow-up appointment.  *Please note that any paperwork needing to be filled out by the provider will need to be addressed at the front desk prior to seeing the provider. Please note that any FMLA, disability or other documents regarding health condition is subject to a $25.00 charge that must be received prior to completion of paperwork in the form of a money order or check.  Thank you for choosing [facility]!!   Dory Horn, RN [phone]  Any Other Special Instructions Will Be Listed Below (If Applicable).     Carvedilol tablets What is this medicine? CARVEDILOL (KAR ve dil ol) is a beta-blocker.  Beta-blockers reduce the workload on the heart and help it to beat more regularly. This medicine is used to treat high blood pressure and heart failure. This medicine may be used for other purposes; ask your health care provider or pharmacist if you have questions. COMMON BRAND NAME(S): Coreg What should I tell my health care provider before I take this medicine? They need to know if you have any of these conditions: -circulation problems -diabetes -history of heart attack or heart disease -liver disease -lung or breathing disease, like asthma or emphysema -pheochromocytoma -slow or irregular heartbeat -thyroid disease -an unusual or allergic reaction to carvedilol, other beta-blockers, medicines, foods, dyes, or preservatives -pregnant or trying to get pregnant -breast-feeding How should I use this medicine? Take this medicine by mouth with a glass of water. Follow the directions on the prescription label. It is best to take the tablets with food. Take your doses at regular intervals. Do not take your medicine more often than directed. Do not stop taking except on the advice of your doctor or health care professional. Talk to your pediatrician regarding the use of this medicine in children. Special care may be needed. Overdosage: If you think you have taken too much of this medicine contact a poison control center or emergency room at once. NOTE: This medicine is only for you. Do not share this medicine with others. What if I miss a dose? If you miss a dose, take it as soon as you can. If it is almost time for your next  dose, take only that dose. Do not take double or extra doses. What may interact with this medicine? This medicine may interact with the following medications: -certain medicines for blood pressure, heart disease, irregular heart beat -certain medicines for depression, like fluoxetine or paroxetine -certain medicines for diabetes, like glipizide or  glyburide -cimetidine -clonidine -cyclosporine -digoxin -MAOIs like Carbex, Eldepryl, Marplan, Nardil, and Parnate -reserpine -rifampin This list may not describe all possible interactions. Give your health care provider a list of all the medicines, herbs, non-prescription drugs, or dietary supplements you use. Also tell them if you smoke, drink alcohol, or use illegal drugs. Some items may interact with your medicine. What should I watch for while using this medicine? Check your heart rate and blood pressure regularly while you are taking this medicine. Ask your doctor or health care professional what your heart rate and blood pressure should be, and when you should contact him or her. Do not stop taking this medicine suddenly. This could lead to serious heart-related effects. Contact your doctor or health care professional if you have difficulty breathing while taking this drug. Check your weight daily. Ask your doctor or health care professional when you should notify him/her of any weight gain. You may get drowsy or dizzy. Do not drive, use machinery, or do anything that requires mental alertness until you know how this medicine affects you. To reduce the risk of dizzy or fainting spells, do not sit or stand up quickly. Alcohol can make you more drowsy, and increase flushing and rapid heartbeats. Avoid alcoholic drinks. If you have diabetes, check your blood sugar as directed. Tell your doctor if you have changes in your blood sugar while you are taking this medicine. If you are going to have surgery, tell your doctor or health care professional that you are taking this medicine. What side effects may I notice from receiving this medicine? Side effects that you should report to your doctor or health care professional as soon as possible: -allergic reactions like skin rash, itching or hives, swelling of the face, lips, or tongue -breathing problems -dark urine -irregular heartbeat -swollen  legs or ankles -vomiting -yellowing of the eyes or skin Side effects that usually do not require medical attention (report to your doctor or health care professional if they continue or are bothersome): -change in sex drive or performance -diarrhea -dry eyes (especially if wearing contact lenses) -dry, itching skin -headache -nausea -unusually tired This list may not describe all possible side effects. Call your doctor for medical advice about side effects. You may report side effects to FDA at 1-800-FDA-1088. Where should I keep my medicine? Keep out of the reach of children. Store at room temperature below 30 degrees C (86 degrees F). Protect from moisture. Keep container tightly closed. Throw away any unused medicine after the expiration date. NOTE: This sheet is a summary. It may not cover all possible information. If you have questions about this medicine, talk to your doctor, pharmacist, or health care provider.  2018 Elsevier/Gold Standard (2012-11-03 14:12:02)  Losartan tablets What is this medicine? LOSARTAN (loe SAR tan) is used to treat high blood pressure and to reduce the risk of stroke in certain patients. This drug also slows the progression of kidney disease in patients with diabetes. This medicine may be used for other purposes; ask your health care provider or pharmacist if you have questions. COMMON BRAND NAME(S): Cozaar What should I tell my health care provider before I take this medicine? They need to  know if you have any of these conditions: -heart failure -kidney or liver disease -an unusual or allergic reaction to losartan, other medicines, foods, dyes, or preservatives -pregnant or trying to get pregnant -breast-feeding How should I use this medicine? Take this medicine by mouth with a glass of water. Follow the directions on the prescription label. This medicine can be taken with or without food. Take your doses at regular intervals. Do not take your  medicine more often than directed. Talk to your pediatrician regarding the use of this medicine in children. Special care may be needed. Overdosage: If you think you have taken too much of this medicine contact a poison control center or emergency room at once. NOTE: This medicine is only for you. Do not share this medicine with others. What if I miss a dose? If you miss a dose, take it as soon as you can. If it is almost time for your next dose, take only that dose. Do not take double or extra doses. What may interact with this medicine? -blood pressure medicines -diuretics, especially triamterene, spironolactone, or amiloride -fluconazole -NSAIDs, medicines for pain and inflammation, like ibuprofen or naproxen -potassium salts or potassium supplements -rifampin This list may not describe all possible interactions. Give your health care provider a list of all the medicines, herbs, non-prescription drugs, or dietary supplements you use. Also tell them if you smoke, drink alcohol, or use illegal drugs. Some items may interact with your medicine. What should I watch for while using this medicine? Visit your doctor or health care professional for regular checks on your progress. Check your blood pressure as directed. Ask your doctor or health care professional what your blood pressure should be and when you should contact him or her. Call your doctor or health care professional if you notice an irregular or fast heart beat. Women should inform their doctor if they wish to become pregnant or think they might be pregnant. There is a potential for serious side effects to an unborn child, particularly in the second or third trimester. Talk to your health care professional or pharmacist for more information. You may get drowsy or dizzy. Do not drive, use machinery, or do anything that needs mental alertness until you know how this drug affects you. Do not stand or sit up quickly, especially if you are an  older patient. This reduces the risk of dizzy or fainting spells. Alcohol can make you more drowsy and dizzy. Avoid alcoholic drinks. Avoid salt substitutes unless you are told otherwise by your doctor or health care professional. Do not treat yourself for coughs, colds, or pain while you are taking this medicine without asking your doctor or health care professional for advice. Some ingredients may increase your blood pressure. What side effects may I notice from receiving this medicine? Side effects that you should report to your doctor or health care professional as soon as possible: -confusion, dizziness, light headedness or fainting spells -decreased amount of urine passed -difficulty breathing or swallowing, hoarseness, or tightening of the throat -fast or irregular heart beat, palpitations, or chest pain -skin rash, itching -swelling of your face, lips, tongue, hands, or feet Side effects that usually do not require medical attention (report to your doctor or health care professional if they continue or are bothersome): -cough -decreased sexual function or desire -headache -nasal congestion or stuffiness -nausea or stomach pain -sore or cramping muscles This list may not describe all possible side effects. Call your doctor for medical advice about side  effects. You may report side effects to FDA at 1-800-FDA-1088. Where should I keep my medicine? Keep out of the reach of children. Store at room temperature between 15 and 30 degrees C (59 and 86 degrees F). Protect from light. Keep container tightly closed. Throw away any unused medicine after the expiration date. NOTE: This sheet is a summary. It may not cover all possible information. If you have questions about this medicine, talk to your doctor, pharmacist, or health care provider.  2018 Elsevier/Gold Standard (2007-05-10 16:42:18)

## 2017-11-30 NOTE — Addendum Note (Signed)
Addended by: Baird LyonsPRICE, Zaydee Aina L on: 11/30/2017 10:26 AM   Modules accepted: Orders

## 2017-12-29 NOTE — Progress Notes (Signed)
Cardiology Office Note:    Date:  12/31/2017   ID:  [name], DOB 1933-12-06, MRN [id number]  PCP:  Krystal Clark, NP  Cardiologist:  Norman Herrlich, MD    Referring MD: Rhea Bleacher*    ASSESSMENT:    1. Coronary artery disease involving native coronary artery of native heart with angina pectoris (HCC)   2. Presence of cardiac pacemaker   3. Sick sinus syndrome (HCC)   4. Hyperlipidemia, unspecified hyperlipidemia type   5. PAF (paroxysmal atrial fibrillation) (HCC)    PLAN:    In order of problems listed above:  1. Stable continue treatment aspirin beta-blocker non-statin lipid-lowering therapy with Zetia.  He is statin intolerant 2. Stable he feels improved after reprogramming will follow in device clinic 3. Stable telemetry shows no sustained arrhythmia we discussed resuming anticoagulants he prefers to hold unless we see more severe prolonged sustained atrial arrhythmia 4. Stable continue Zetia 5. Noted on device telemetry A. fib burden is quite low and I do not think is unreasonable to remain on aspirin   Next appointment: 6 months   Medication Adjustments/Labs and Tests Ordered: Current medicines are reviewed at length with the patient today.  Concerns regarding medicines are outlined above.  No orders of the defined types were placed in this encounter.  No orders of the defined types were placed in this encounter.   Chief Complaint  Patient presents with  . Follow-up    after pacemaker reprogrammed  . Coronary Artery Disease  . Atrial Fibrillation    History of Present Illness:    [name] is a 84 y.o. male with a hx of CAD EF 45 %, CABG in 2006 with LTA  SVG to D1  and second SVG sequential to RCA and Marginal, atrial flutter with EP ablation in 2014, RBBB, VT and a pacemaker. He was  last seen 11/21/17 and had EP evaluation and pacemaker reprogrammed.. Compliance with diet, lifestyle and medications: Yes He does feel  improved after reprogramming less fatigue.  His predominant problem is severe back and radicular pain he has asked me about pain relief modalities and I told him perhaps he should see a back surgeon to see if they can help him with epidural injections or other nonnarcotic treatment.  He is having no shortness of breath edema chest pain palpitation or syncope. Past Medical History:  Diagnosis Date  . Abnormal myocardial perfusion study 09/15/2016  . Anticoagulant long-term use 05/05/2015  . Aortic atherosclerosis (HCC) 02/16/2016  . BMI 31.0-31.9,adult 05/05/2015  . CAD in native artery 02/14/2015  . Chronic rhinitis 05/05/2015  . Coronary artery abnormality 02/14/2015  . Coronary artery disease involving coronary bypass graft of native heart without angina pectoris 05/05/2015  . Dizziness 10/05/2016  . Elevated PSA 05/05/2015   [name]  . Essential hypertension 02/14/2015  . GERD (gastroesophageal reflux disease) 05/05/2015  . High risk medication use 05/05/2015  . History of cardiac radiofrequency ablation 09/15/2016  . Hx of CABG 09/15/2016  . Hyperlipidemia 02/14/2015  . Lumbar degenerative disc disease 05/05/2015  . Malaise and fatigue 05/05/2015   Last Assessment & Plan:  Relevant Hx: Course: Daily Update: Today's Plan:he admits that this can be prominent for him and he had UA checked to ensure not having infection which he was concerned about and reassured him of this, encouraged him to remain active and his meds contribute to this as well which he is aware of   Electronically signed by: Krystal Clark,  NP 08/12/15 1303  . Old MI (myocardial infarction) 09/15/2016  . Partial small bowel obstruction (HCC) 03/05/2017  . Prediabetes 05/05/2015  . Preoperative cardiovascular examination 02/14/2015  . Presence of cardiac pacemaker 02/14/2015   For symptomatic sinus bradycardia after flutter ablation For symptomatic sinus bradycardia after flutter ablation  . Primary osteoarthritis involving multiple  joints 05/05/2015   Last Assessment & Plan:  Relevant Hx: Course: Daily Update: Today's Plan:he has known OA to his joints and he has seen chiropractor in the past for this and he states that he was told he had degenerative changes to his back several times. The knee needs to be xrayed to the right with the swelling , he is taking the oxycodone only as needed but it works well he does not feel dizzy whereas the aleve  . RBBB with left anterior fascicular block 02/14/2015  . Recurrent incisional hernia 03/05/2017  . SSS (sick sinus syndrome) (HCC) 02/17/2015  . Thrombocytopenia due to drugs 07/16/2017  . Typical atrial flutter (HCC) 05/05/2015   Last Assessment & Plan:  Warfarin was stopped September 07, 2016 by physicians assistant or [place] [facility] after consistent monitoring showed no recurrence of A. fib for more than 1-1/2 years  . Ventral hernia without obstruction or gangrene 05/05/2015    Past Surgical History:  Procedure Laterality Date  . ATRIAL FLUTTER ABLATION    . COLON SURGERY    . CORONARY ARTERY BYPASS GRAFT    . INSERT / REPLACE / REMOVE PACEMAKER      Current Medications: Current Meds  Medication Sig  . aspirin EC 81 MG tablet Take 1 tablet (81 mg total) by mouth daily.  Marland Kitchen ezetimibe (ZETIA) 10 MG tablet Take 1 tablet (10 mg total) by mouth daily.  Marland Kitchen losartan (COZAAR) 25 MG tablet Take 1 tablet (25 mg total) by mouth daily.  . Multiple Vitamin (MULTI-VITAMINS) TABS Take 1 tablet by mouth daily.  . Omega-3 1000 MG CAPS Take 2 g by mouth daily.  Marland Kitchen omeprazole (PRILOSEC) 20 MG capsule Take 1 capsule by mouth daily.  Marland Kitchen oxyCODONE-acetaminophen (PERCOCET/ROXICET) 5-325 MG tablet Take 1 tablet by mouth every 8 (eight) hours as needed for severe pain.     Allergies:   Amiodarone; Gabapentin; Metoprolol; Statins; Doxycycline; Neomycin; Niacin; and Ramipril   Social History   Socioeconomic History  . Marital status: Divorced    Spouse name: Not on file  . Number of children: Not  on file  . Years of education: Not on file  . Highest education level: Not on file  Occupational History  . Not on file  Social Needs  . Financial resource strain: Not on file  . Food insecurity:    Worry: Not on file    Inability: Not on file  . Transportation needs:    Medical: Not on file    Non-medical: Not on file  Tobacco Use  . Smoking status: Never Smoker  . Smokeless tobacco: Never Used  Substance and Sexual Activity  . Alcohol use: Never    Frequency: Never  . Drug use: Never  . Sexual activity: Not on file  Lifestyle  . Physical activity:    Days per week: Not on file    Minutes per session: Not on file  . Stress: Not on file  Relationships  . Social connections:    Talks on phone: Not on file    Gets together: Not on file    Attends religious service: Not on file    Active  member of club or organization: Not on file    Attends meetings of clubs or organizations: Not on file    Relationship status: Not on file  Other Topics Concern  . Not on file  Social History Narrative  . Not on file     Family History: The patient's family history includes AAA (abdominal aortic aneurysm) in his daughter; Diabetes in his daughter and sister; Hyperlipidemia in his daughter; Hypertension in his daughter and daughter; Migraines in his daughter; Rheum arthritis in his daughter. There is no history of Heart attack, Heart disease, or Heart failure. ROS:   Please see the history of present illness.    All other systems reviewed and are negative.  EKGs/Labs/Other Studies Reviewed:    The following studies were reviewed today:  His pacemaker was interrogated and reprogrammed after his last visit.  PVC burden was low less than 1% and had short episodes of paroxysmal atrial fibrillation  11/06/17 CMP normal Chol147 HDL 38 LDL 99 Recent Labs:   Physical Exam:    VS:  BP (!) 150/80 (BP Location: Left Arm, Patient Position: Sitting, Cuff Size: Normal)   Pulse 68   Ht 5\' 10"   (1.778 m)   Wt 203 lb (92.1 kg)   SpO2 99%   BMI 29.13 kg/m     Wt Readings from Last 3 Encounters:  12/31/17 203 lb (92.1 kg)  11/30/17 200 lb (90.7 kg)  11/21/17 202 lb 9.6 oz (91.9 kg)     GEN:  Well nourished, well developed in no acute distress HEENT: Normal NECK: No JVD; No carotid bruits LYMPHATICS: No lymphadenopathy CARDIAC: RRR, no murmurs, rubs, gallops RESPIRATORY:  Clear to auscultation without rales, wheezing or rhonchi  ABDOMEN: Soft, non-tender, non-distended MUSCULOSKELETAL:  No edema; No deformity  SKIN: Warm and dry NEUROLOGIC:  Alert and oriented x 3 PSYCHIATRIC:  Normal affect    Signed, Norman Herrlich, MD  12/31/2017 1:15 PM    [name]

## 2017-12-31 ENCOUNTER — Encounter: Payer: Self-pay | Admitting: Cardiology

## 2017-12-31 ENCOUNTER — Ambulatory Visit (INDEPENDENT_AMBULATORY_CARE_PROVIDER_SITE_OTHER): Payer: Medicare Other | Admitting: Cardiology

## 2017-12-31 VITALS — BP 150/80 | HR 68 | Ht 70.0 in | Wt 203.0 lb

## 2017-12-31 DIAGNOSIS — Z950 Presence of cardiac pacemaker: Secondary | ICD-10-CM | POA: Diagnosis not present

## 2017-12-31 DIAGNOSIS — I25119 Atherosclerotic heart disease of native coronary artery with unspecified angina pectoris: Secondary | ICD-10-CM | POA: Diagnosis not present

## 2017-12-31 DIAGNOSIS — I480 Paroxysmal atrial fibrillation: Secondary | ICD-10-CM

## 2017-12-31 DIAGNOSIS — I495 Sick sinus syndrome: Secondary | ICD-10-CM

## 2017-12-31 DIAGNOSIS — E785 Hyperlipidemia, unspecified: Secondary | ICD-10-CM

## 2017-12-31 HISTORY — DX: Paroxysmal atrial fibrillation: I48.0

## 2017-12-31 NOTE — Patient Instructions (Signed)
Medication Instructions:  Your physician recommends that you continue on your current medications as directed. Please refer to the Current Medication list given to you today.  If you need a refill on your cardiac medications before your next appointment, please call your pharmacy.   Lab work: None  Testing/Procedures: None  Follow-Up: At [facility], you and your health needs are our priority.  As part of our continuing mission to provide you with exceptional heart care, we have created designated Provider Care Teams.  These Care Teams include your primary Cardiologist (physician) and Advanced Practice Providers (APPs -  Physician Assistants and Nurse Practitioners) who all work together to provide you with the care you need, when you need it. . You will need a follow up appointment in 6 months.  Please call our office 2 months in advance to schedule this appointment.   Any Other Special Instructions Will Be Listed Below (If Applicable). **Consider Dr. Loralie Champagne to further evaluate back pain

## 2018-03-04 ENCOUNTER — Ambulatory Visit (INDEPENDENT_AMBULATORY_CARE_PROVIDER_SITE_OTHER): Payer: Medicare Other

## 2018-03-04 DIAGNOSIS — I495 Sick sinus syndrome: Secondary | ICD-10-CM | POA: Diagnosis not present

## 2018-03-04 NOTE — Progress Notes (Signed)
Remote pacemaker transmission.   

## 2018-03-05 LAB — CUP PACEART REMOTE DEVICE CHECK
Date Time Interrogation Session: 20191224105536
Implantable Lead Implant Date: 20140813
Implantable Lead Implant Date: 20140813
Implantable Lead Location: 753859
Implantable Lead Location: 753860
Implantable Lead Model: 350974
Implantable Lead Serial Number: 1111
Implantable Lead Serial Number: 2222
Implantable Pulse Generator Implant Date: 20140813
Pulse Gen Serial Number: 68050901

## 2018-04-24 DIAGNOSIS — H6123 Impacted cerumen, bilateral: Secondary | ICD-10-CM | POA: Insufficient documentation

## 2018-04-24 HISTORY — DX: Impacted cerumen, bilateral: H61.23

## 2018-05-07 ENCOUNTER — Other Ambulatory Visit: Payer: Self-pay | Admitting: Cardiology

## 2018-06-03 ENCOUNTER — Ambulatory Visit (INDEPENDENT_AMBULATORY_CARE_PROVIDER_SITE_OTHER): Payer: Medicare Other | Admitting: *Deleted

## 2018-06-03 ENCOUNTER — Other Ambulatory Visit: Payer: Self-pay

## 2018-06-03 DIAGNOSIS — I480 Paroxysmal atrial fibrillation: Secondary | ICD-10-CM

## 2018-06-03 DIAGNOSIS — I495 Sick sinus syndrome: Secondary | ICD-10-CM

## 2018-06-04 LAB — CUP PACEART REMOTE DEVICE CHECK
Date Time Interrogation Session: 20200324091318
Implantable Lead Implant Date: 20140813
Implantable Lead Implant Date: 20140813
Implantable Lead Location: 753859
Implantable Lead Location: 753860
Implantable Lead Model: 350974
Implantable Lead Serial Number: 1111
Implantable Lead Serial Number: 2222
Implantable Pulse Generator Implant Date: 20140813
Pulse Gen Serial Number: 68050901

## 2018-06-11 NOTE — Progress Notes (Signed)
Remote pacemaker transmission.   

## 2018-06-28 ENCOUNTER — Telehealth: Payer: Self-pay

## 2018-06-28 NOTE — Telephone Encounter (Signed)
YOUR CARDIOLOGY TEAM HAS ARRANGED FOR AN E-VISIT FOR YOUR APPOINTMENT - PLEASE REVIEW IMPORTANT INFORMATION BELOW SEVERAL DAYS PRIOR TO YOUR APPOINTMENT  Due to the recent COVID-19 pandemic, we are transitioning in-person office visits to tele-medicine visits in an effort to decrease unnecessary exposure to our patients, their families, and staff. These visits are billed to your insurance just like a normal visit is. We also encourage you to sign up for MyChart if you have not already done so. You will need a smartphone if possible. For patients that do not have this, we can still complete the visit using a regular telephone but do prefer a smartphone to enable video when possible. You may have a family member that lives with you that can help. If possible, we also ask that you have a blood pressure cuff and scale at home to measure your blood pressure, heart rate and weight prior to your scheduled appointment. Patients with clinical needs that need an in-person evaluation and testing will still be able to come to the office if absolutely necessary. If you have any questions, feel free to call our office.     YOUR PROVIDER WILL BE USING THE FOLLOWING PLATFORM TO COMPLETE YOUR VISIT: Televisit  2-3 DAYS BEFORE YOUR APPOINTMENT  You will receive a telephone call from one of our [facility] team members - your caller ID may say "Unknown caller." If this is a video visit, we will walk you through how to set up your device to be able to complete the visit. We will remind you check your blood pressure, heart rate and weight prior to your scheduled appointment. If you have an Apple Watch or Kardia, please upload any pertinent ECG strips the day before or morning of your appointment to MyChart. Our staff will also make sure you have reviewed the consent and agree to move forward with your scheduled tele-health visit.     THE DAY OF YOUR APPOINTMENT  Approximately 15 minutes prior to your scheduled  appointment, you will receive a telephone call from one of Heart[facility] team - your caller ID may say "Unknown caller."  Our staff will confirm medications, vital signs for the day and any symptoms you may be experiencing. Please have this information available prior to the time of visit start. It may also be helpful for you to have a pad of paper and pen handy for any instructions given during your visit. They will also walk you through joining the smartphone meeting if this is a video visit.    CONSENT FOR TELE-HEALTH VISIT - PLEASE REVIEW  I hereby voluntarily request, consent and authorize [facility] and its employed or contracted physicians, physician assistants, nurse practitioners or other licensed health care professionals (the Practitioner), to provide me with telemedicine health care services (the "Services") as deemed necessary by the treating Practitioner. I acknowledge and consent to receive the Services by the Practitioner via telemedicine. I understand that the telemedicine visit will involve communicating with the Practitioner through live audiovisual communication technology and the disclosure of certain medical information by electronic transmission. I acknowledge that I have been given the opportunity to request an in-person assessment or other available alternative prior to the telemedicine visit and am voluntarily participating in the telemedicine visit.  I understand that I have the right to withhold or withdraw my consent to the use of telemedicine in the course of my care at any time, without affecting my right to future care or treatment, and that the Practitioner or I  may terminate the telemedicine visit at any time. I understand that I have the right to inspect all information obtained and/or recorded in the course of the telemedicine visit and may receive copies of available information for a reasonable fee.  I understand that some of the potential risks of receiving the Services  via telemedicine include:  Marland Kitchen Delay or interruption in medical evaluation due to technological equipment failure or disruption; . Information transmitted may not be sufficient (e.g. poor resolution of images) to allow for appropriate medical decision making by the Practitioner; and/or  . In rare instances, security protocols could fail, causing a breach of personal health information.  Furthermore, I acknowledge that it is my responsibility to provide information about my medical history, conditions and care that is complete and accurate to the best of my ability. I acknowledge that Practitioner's advice, recommendations, and/or decision may be based on factors not within their control, such as incomplete or inaccurate data provided by me or distortions of diagnostic images or specimens that may result from electronic transmissions. I understand that the practice of medicine is not an exact science and that Practitioner makes no warranties or guarantees regarding treatment outcomes. I acknowledge that I will receive a copy of this consent concurrently upon execution via email to the email address I last provided but may also request a printed copy by calling the office of [facility].    I understand that my insurance will be billed for this visit.   I have read or had this consent read to me. . I understand the contents of this consent, which adequately explains the benefits and risks of the Services being provided via telemedicine.  . I have been provided ample opportunity to ask questions regarding this consent and the Services and have had my questions answered to my satisfaction. . I give my informed consent for the services to be provided through the use of telemedicine in my medical care  By participating in this telemedicine visit I agree to the above.  Patient gave verbal consent to Televisit with [name] on 07-01-18.

## 2018-07-01 ENCOUNTER — Other Ambulatory Visit: Payer: Self-pay

## 2018-07-01 ENCOUNTER — Telehealth (INDEPENDENT_AMBULATORY_CARE_PROVIDER_SITE_OTHER): Payer: Medicare Other | Admitting: Cardiology

## 2018-07-01 ENCOUNTER — Encounter: Payer: Self-pay | Admitting: Cardiology

## 2018-07-01 DIAGNOSIS — I25119 Atherosclerotic heart disease of native coronary artery with unspecified angina pectoris: Secondary | ICD-10-CM

## 2018-07-01 DIAGNOSIS — I480 Paroxysmal atrial fibrillation: Secondary | ICD-10-CM

## 2018-07-01 DIAGNOSIS — I495 Sick sinus syndrome: Secondary | ICD-10-CM

## 2018-07-01 DIAGNOSIS — I119 Hypertensive heart disease without heart failure: Secondary | ICD-10-CM

## 2018-07-01 DIAGNOSIS — E785 Hyperlipidemia, unspecified: Secondary | ICD-10-CM

## 2018-07-01 DIAGNOSIS — Z950 Presence of cardiac pacemaker: Secondary | ICD-10-CM

## 2018-07-01 MED ORDER — EZETIMIBE 10 MG PO TABS: 10.0000 mg | ORAL_TABLET | Freq: Every day | ORAL | 2 refills | Status: DC

## 2018-07-01 MED ORDER — LOSARTAN POTASSIUM 25 MG PO TABS: 25.0000 mg | ORAL_TABLET | Freq: Every day | ORAL | 2 refills | Status: DC

## 2018-07-01 NOTE — Addendum Note (Signed)
Addended by: Lamona Curl on: 07/01/2018 04:08 PM   Modules accepted: Orders

## 2018-07-01 NOTE — Patient Instructions (Signed)
Medication Instructions:  Your physician recommends that you continue on your current medications as directed. Please refer to the Current Medication list given to you today.  Your physician has requested that you regularly monitor and record your blood pressure readings at home. Please use the same machine at the same time of day to check your readings and record them.  If your systolic (top number) blood pressure runs below 120, please contact our office for medication adjustments.  If you need a refill on your cardiac medications before your next appointment, please call your pharmacy.   Lab work: NONE If you have labs (blood work) drawn today and your tests are completely normal, you will receive your results only by: Marland Kitchen MyChart Message (if you have MyChart) OR . A paper copy in the mail If you have any lab test that is abnormal or we need to change your treatment, we will call you to review the results.  Testing/Procedures: NONE  Follow-Up: At [facility], you and your health needs are our priority.  As part of our continuing mission to provide you with exceptional heart care, we have created designated Provider Care Teams.  These Care Teams include your primary Cardiologist (physician) and Advanced Practice Providers (APPs -  Physician Assistants and Nurse Practitioners) who all work together to provide you with the care you need, when you need it. You will need a follow up appointment on December 31, 2018 at 9:40am.  Please arrive at 9:20am.

## 2018-07-01 NOTE — Progress Notes (Signed)
Virtual Visit via Telephone Note   This visit type was conducted due to national recommendations for restrictions regarding the COVID-19 Pandemic (e.g. social distancing) in an effort to limit this patient's exposure and mitigate transmission in our community.  Due to his co-morbid illnesses, this patient is at least at moderate risk for complications without adequate follow up.  This format is felt to be most appropriate for this patient at this time.  The patient did not have access to video technology/had technical difficulties with video requiring transitioning to audio format only (telephone).  All issues noted in this document were discussed and addressed.  No physical exam could be performed with this format.  Please refer to the patient's chart for his  consent to telehealth for [facility]   Evaluation Performed:  Follow-up visit  Date:  07/01/2018   ID:  [name], DOB [birth year], MRN [id number]  Patient Location: Home Provider Location: Home  PCP:  Krystal Clark, NP  Cardiologist:  No primary care provider on file. [name] Electrophysiologist:  None   Chief Complaint:  FU for CAD and pacemaker  History of Present Illness:     [name] is a 85 y.o. male with a hx of CAD EF 45 %, CABG in 2006 with LTA  SVG to D1  and second SVG sequential to RCA and Marginal, atrial flutter with EP ablation in 2014, RBBB, VT and a pacemaker  last seen 12/31/17.  The patient does not have symptoms concerning for COVID-19 infection (fever, chills, cough, or new shortness of breath).   From a cardiology perspective he is doing well he has no palpitation edema shortness of breath chest pain or syncope but he does get lightheaded when he stands.  Previously he had checked his blood pressure at home he is moved he is not sure where the cuff is and have asked him to start rechecking his home blood pressures and if he is having systolics less than 120 I would discontinue his  ARB.  Unfortunately because of low back pain he can no longer do his walking that he did for years and I asked him to get a recumbent bike for activity.  We continue to follow his device in our clinic and he has normal function and no sustained arrhythmia.  Recent labs reviewed from his PCP office CMP CBC TSH were normal and his lipids I thought were on target with a total cholesterol 132 LDL 82 HDL 38 as he is statin intolerant.  He takes Zetia and fish oil. We reviewed his last device download together during the visit  Past Medical History:  Diagnosis Date   Abnormal myocardial perfusion study 09/15/2016   Anticoagulant long-term use 05/05/2015   Aortic atherosclerosis (HCC) 02/16/2016   BMI 31.0-31.9,adult 05/05/2015   CAD in native artery 02/14/2015   Chronic rhinitis 05/05/2015   Coronary artery abnormality 02/14/2015   Coronary artery disease involving coronary bypass graft of native heart without angina pectoris 05/05/2015   Dizziness 10/05/2016   Elevated PSA 05/05/2015   [facility]   Essential hypertension 02/14/2015   GERD (gastroesophageal reflux disease) 05/05/2015   High risk medication use 05/05/2015   History of cardiac radiofrequency ablation 09/15/2016   Hx of CABG 09/15/2016   Hyperlipidemia 02/14/2015   Lumbar degenerative disc disease 05/05/2015   Malaise and fatigue 05/05/2015   Last Assessment & Plan:  Relevant Hx: Course: Daily Update: Today's Plan:he admits that this can be prominent for him and he  had UA checked to ensure not having infection which he was concerned about and reassured him of this, encouraged him to remain active and his meds contribute to this as well which he is aware of   Electronically signed by: Krystal Clark, NP 08/12/15 1303   Old MI (myocardial infarction) 09/15/2016   Partial small bowel obstruction (HCC) 03/05/2017   Prediabetes 05/05/2015   Preoperative cardiovascular examination 02/14/2015   Presence of cardiac pacemaker  02/14/2015   For symptomatic sinus bradycardia after flutter ablation For symptomatic sinus bradycardia after flutter ablation   Primary osteoarthritis involving multiple joints 05/05/2015   Last Assessment & Plan:  Relevant Hx: Course: Daily Update: Today's Plan:he has known OA to his joints and he has seen chiropractor in the past for this and he states that he was told he had degenerative changes to his back several times. 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 fib for more than 1-1/2 years   Ventral hernia without obstruction or gangrene 05/05/2015   Past Surgical History:  Procedure Laterality Date   ATRIAL FLUTTER ABLATION     COLON SURGERY     CORONARY ARTERY BYPASS GRAFT     INSERT / REPLACE / REMOVE PACEMAKER       Current Meds  Medication Sig   aspirin EC 81 MG tablet Take 1 tablet (81 mg total) by mouth daily.   ezetimibe (ZETIA) 10 MG tablet TAKE ONE (1) TABLET BY MOUTH ONCE DAILY   losartan (COZAAR) 25 MG tablet Take 1 tablet (25 mg total) by mouth daily.   Multiple Vitamin (MULTI-VITAMINS) TABS Take 1 tablet by mouth daily.   Omega-3 1000 MG CAPS Take 2 g by mouth daily.   omeprazole (PRILOSEC) 20 MG capsule Take 1 capsule by mouth daily.   oxyCODONE-acetaminophen (PERCOCET/ROXICET) 5-325 MG tablet Take 1 tablet by mouth every 8 (eight) hours as needed for severe pain.     Allergies:   Amiodarone; Gabapentin; Metoprolol; Statins; Doxycycline; Neomycin; Niacin;  and Ramipril   Social History   Tobacco Use   Smoking status: Never Smoker   Smokeless tobacco: Never Used  Substance Use Topics   Alcohol use: Never    Frequency: Never   Drug use: Never     Family Hx: The patient's family history includes AAA (abdominal aortic aneurysm) in his daughter; Diabetes in his daughter and sister; Hyperlipidemia in his daughter; Hypertension in his daughter and daughter; Migraines in his daughter; Rheum arthritis in his daughter. There is no history of Heart attack, Heart disease, or Heart failure.  ROS:   Please see the history of present illness.     All other systems reviewed and are negative.   Prior CV studies:   The following studies were reviewed today:  Device check 04/01/18: Conclusion   Scheduled remote reviewed.  Normal device function.   Next remote 91 days.   JMoose  Result Report  Clinic Name: [facility]      Date Time  Interrogation Session: [id number]       Implantable Lead Implant Date: [id number]      Implantable Lead Implant Date: [id number]       Implantable Lead Location: [id number]      Implantable Lead Location: [id number]       Implantable Lead Location Detail 1: UNKNOWN      Implantable Lead Location Detail 1: UNKNOWN       Implantable Lead Manufacturer: BITR      Implantable Lead Manufacturer: [facility]       Implantable Lead Model: [id number] Setrox S 53      Implantable Lead Model: 1888TC Tendril ST Optim       Implantable Lead Serial Number: [id number]      Implantable Lead Serial Number: [id number]       Implantable Pulse Generator Implant Date: [id number]      Implantable Pulse Generator Type: Implantable Pulse Generator       Pulse Gen Model: Evia DR-T      Pulse Gen Serial Number: [id number]       Pulse Generator Manufacturer:        Labs/Other Tests and Data Reviewed:    EKG:  No ECG reviewed.  Recent Labs: No results found for requested labs within last 8760 hours.   Recent Lipid Panel No  results found for: CHOL, TRIG, HDL, CHOLHDL, LDLCALC, LDLDIRECT  Wt Readings from Last 3 Encounters:  12/31/17 203 lb (92.1 kg)  11/30/17 200 lb (90.7 kg)  11/21/17 202 lb 9.6 oz (91.9 kg)     Objective:    Vital Signs:  There were no vitals taken for this visit.   VITAL SIGNS:  reviewed  ASSESSMENT & PLAN:    1. Coronary artery disease stable New York Heart Association class I currently having no angina on current treatment after surgical revascularization.  He will continue his current medical treatment including aspirin and Zetia for lipids. 2. Sick sinus syndrome improved after reprogramming will continue to follow his pacemaker in our practice 3. Cardiac pacemaker stable function 4. Atrial fibrillation stable no recurrence and has had no sustained atrial arrhythmia on device check. 5. Hypertension stable, on concerned he is having symptomatic hypotension I asked him to start checking his home blood pressures and if he has systolics of less than 120 we will stop his ARB 6. Hyperlipidemia stable his lipids are at target and he is statin intolerant and will continue Zetia  COVID-19 Education: The signs and symptoms of COVID-19 were discussed with the patient and how to seek care for testing (follow up with PCP or arrange E-visit).  The importance of social distancing was discussed today.  Time:   Today, I have spent 23 minutes with the patient with telehealth technology discussing the above problems.     Medication Adjustments/Labs and Tests Ordered: Current medicines are reviewed at length with the patient today.  Concerns regarding medicines are outlined above.   Tests Ordered: No orders of the defined types were placed in this encounter.   Medication Changes: No orders of the defined types were placed in this encounter.   Disposition:  Follow up in 6 month(s)  Signed, Norman HerrlichBrian Soliana Kitko, MD  07/01/2018 3:41 PM    [facility]

## 2018-09-02 ENCOUNTER — Ambulatory Visit (INDEPENDENT_AMBULATORY_CARE_PROVIDER_SITE_OTHER): Payer: Medicare Other | Admitting: *Deleted

## 2018-09-02 DIAGNOSIS — I495 Sick sinus syndrome: Secondary | ICD-10-CM

## 2018-09-02 DIAGNOSIS — I480 Paroxysmal atrial fibrillation: Secondary | ICD-10-CM

## 2018-09-05 LAB — CUP PACEART REMOTE DEVICE CHECK
Date Time Interrogation Session: 20200625092507
Implantable Lead Implant Date: 20140813
Implantable Lead Implant Date: 20140813
Implantable Lead Location: 753859
Implantable Lead Location: 753860
Implantable Lead Model: 350974
Implantable Lead Serial Number: 1111
Implantable Lead Serial Number: 2222
Implantable Pulse Generator Implant Date: 20140813
Pulse Gen Serial Number: 68050901

## 2018-09-09 NOTE — Progress Notes (Signed)
Remote pacemaker transmission.   

## 2018-12-03 ENCOUNTER — Ambulatory Visit (INDEPENDENT_AMBULATORY_CARE_PROVIDER_SITE_OTHER): Payer: Medicare Other | Admitting: *Deleted

## 2018-12-03 DIAGNOSIS — I480 Paroxysmal atrial fibrillation: Secondary | ICD-10-CM

## 2018-12-03 DIAGNOSIS — I495 Sick sinus syndrome: Secondary | ICD-10-CM

## 2018-12-04 LAB — CUP PACEART REMOTE DEVICE CHECK
Date Time Interrogation Session: 20200923070608
Implantable Lead Implant Date: 20140813
Implantable Lead Implant Date: 20140813
Implantable Lead Location: 753859
Implantable Lead Location: 753860
Implantable Lead Model: 350974
Implantable Lead Serial Number: 1111
Implantable Lead Serial Number: 2222
Implantable Pulse Generator Implant Date: 20140813
Pulse Gen Serial Number: 68050901

## 2018-12-10 NOTE — Progress Notes (Signed)
Remote pacemaker transmission.   

## 2018-12-30 NOTE — Progress Notes (Signed)
Cardiology Office Note:    Date:  12/31/2018   ID:  [name], DOB 1933/09/25, MRN [id number]  PCP:  Krystal Clark, NP  Cardiologist:  Norman Herrlich, MD    Referring MD: Rhea Bleacher*    ASSESSMENT:    1. Coronary artery disease involving native coronary artery of native heart with angina pectoris (HCC)   2. Typical atrial flutter (HCC)   3. Presence of cardiac pacemaker   4. Essential hypertension   5. Mixed hyperlipidemia   6. Statin intolerance    PLAN:    In order of problems listed above:  1. Stable CAD asymptomatic New York Heart Association class I after surgical revascularization I asked him to continue aspirin and restart nonstatin Zetia and follow-up labs CMP lipid profile in 1 month.  He may require PCSK9 therapy 2. Stable no recurrence no longer anticoagulated 3. Stable function followed in our device clinic 4. Stable presently off an ARB and him not can reintroduce at this time 5. Poorly controlled hyperlipidemia with noncompliance resume Zetia follow-up labs in 1 month goal LDL less than 70   Next appointment: 6 months  I strongly encouraged him to consider evaluation for his chronic back pain and nonsurgical options if available  Medication Adjustments/Labs and Tests Ordered: Current medicines are reviewed at length with the patient today.  Concerns regarding medicines are outlined above.  Orders Placed This Encounter  Procedures  . Comprehensive Metabolic Panel (CMET)  . Lipid Profile  . EKG 12-Lead   Meds ordered this encounter  Medications  . ezetimibe (ZETIA) 10 MG tablet    Sig: Take 1 tablet (10 mg total) by mouth daily.    Dispense:  30 tablet    Refill:  3    Chief Complaint  Patient presents with  . Follow-up    for pacemaker  . Coronary Artery Disease  . Atrial Flutter    History of Present Illness:    [name] is a 85 y.o. male with a hx of CAD EF 45 %, CABG in 2006 with LTA  SVG to D1  and second  SVG sequential to RCA and Marginal, atrial flutter with EP ablation in 2014, RBBB, VT and a pacemaker last seen 07/01/18. Compliance with diet, lifestyle and medications: No he ran out of both Zetia and losartan  He is not doing well chronically.  His biggest problem is low back and radicular pain advised him to seek attention and consider even epidural injections.  He complains bitterly of dizziness and occurs anytime he shifts posture up or down or turns his head and he has a vertigo.  He has no orthostatic shifting blood pressure in my office today I checked it was 120/60 both postures.  No shortness of breath edema palpitation or syncope.  Last device check 12/03/2018 showed normal pacemaker parameters no arrhythmia noticed. Past Medical History:  Diagnosis Date  . Abnormal myocardial perfusion study 09/15/2016  . Anticoagulant long-term use 05/05/2015  . Aortic atherosclerosis (HCC) 02/16/2016  . BMI 31.0-31.9,adult 05/05/2015  . CAD in native artery 02/14/2015  . Chronic rhinitis 05/05/2015  . Coronary artery abnormality 02/14/2015  . Coronary artery disease involving coronary bypass graft of native heart without angina pectoris 05/05/2015  . Dizziness 10/05/2016  . Elevated PSA 05/05/2015   [facility] Cameron  . Essential hypertension 02/14/2015  . GERD (gastroesophageal reflux disease) 05/05/2015  . High risk medication use 05/05/2015  . History of cardiac radiofrequency ablation 09/15/2016  . Hx of CABG  09/15/2016  . Hyperlipidemia 02/14/2015  . Lumbar degenerative disc disease 05/05/2015  . Malaise and fatigue 05/05/2015   Last Assessment & Plan:  Relevant Hx: Course: Daily Update: Today's Plan:he admits that this can be prominent for him and he had UA checked to ensure not having infection which he was concerned about and reassured him of this, encouraged him to remain active and his meds contribute to this as well which he is aware of   Electronically signed by: Krystal ClarkMelissa Joyce Kunath-Patram, NP 08/12/15 1303  . Old  MI (myocardial infarction) 09/15/2016  . Partial small bowel obstruction (HCC) 03/05/2017  . Prediabetes 05/05/2015  . Preoperative cardiovascular examination 02/14/2015  . Presence of cardiac pacemaker 02/14/2015   For symptomatic sinus bradycardia after flutter ablation For symptomatic sinus bradycardia after flutter ablation  . Primary osteoarthritis involving multiple joints 05/05/2015   Last Assessment & Plan:  Relevant Hx: Course: Daily Update: Today's Plan:he has known OA to his joints and he has seen chiropractor in the past for this and he states that he was told he had degenerative changes to his back several times. The knee needs to be xrayed to the right with the swelling , he is taking the oxycodone only as needed but it works well he does not feel dizzy whereas the aleve  . RBBB with left anterior fascicular block 02/14/2015  . Recurrent incisional hernia 03/05/2017  . SSS (sick sinus syndrome) (HCC) 02/17/2015  . Thrombocytopenia due to drugs 07/16/2017  . Typical atrial flutter (HCC) 05/05/2015   Last Assessment & Plan:  Warfarin was stopped September 07, 2016 by physicians assistant or [place] [facility] after consistent monitoring showed no recurrence of A. fib for more than 1-1/2 years  . Ventral hernia without obstruction or gangrene 05/05/2015    Past Surgical History:  Procedure Laterality Date  . ATRIAL FLUTTER ABLATION    . COLON SURGERY    . CORONARY ARTERY BYPASS GRAFT    . INSERT / REPLACE / REMOVE PACEMAKER      Current Medications: Current Meds  Medication Sig  . aspirin EC 81 MG tablet Take 1 tablet (81 mg total) by mouth daily.  . Multiple Vitamin (MULTI-VITAMINS) TABS Take 1 tablet by mouth daily.  . Omega-3 1000 MG CAPS Take 2 g by mouth daily.  Marland Kitchen. omeprazole (PRILOSEC) 20 MG capsule Take 1 capsule by mouth every other day.   . oxyCODONE-acetaminophen (PERCOCET/ROXICET) 5-325 MG tablet Take 1 tablet by mouth every 8 (eight) hours as needed for severe pain.      Allergies:   Amiodarone, Gabapentin, Metoprolol, Statins, Doxycycline, Neomycin, Niacin, and Ramipril   Social History   Socioeconomic History  . Marital status: Divorced    Spouse name: Not on file  . Number of children: Not on file  . Years of education: Not on file  . Highest education level: Not on file  Occupational History  . Not on file  Social Needs  . Financial resource strain: Not on file  . Food insecurity    Worry: Not on file    Inability: Not on file  . Transportation needs    Medical: Not on file    Non-medical: Not on file  Tobacco Use  . Smoking status: Never Smoker  . Smokeless tobacco: Never Used  Substance and Sexual Activity  . Alcohol use: Never    Frequency: Never  . Drug use: Never  . Sexual activity: Not on file  Lifestyle  . Physical activity  Days per week: Not on file    Minutes per session: Not on file  . Stress: Not on file  Relationships  . Social Herbalist on phone: Not on file    Gets together: Not on file    Attends religious service: Not on file    Active member of club or organization: Not on file    Attends meetings of clubs or organizations: Not on file    Relationship status: Not on file  Other Topics Concern  . Not on file  Social History Narrative  . Not on file     Family History: The patient's family history includes AAA (abdominal aortic aneurysm) in his daughter; Diabetes in his daughter and sister; Hyperlipidemia in his daughter; Hypertension in his daughter and daughter; Migraines in his daughter; Rheum arthritis in his daughter. There is no history of Heart attack, Heart disease, or Heart failure. ROS:   Please see the history of present illness.    All other systems reviewed and are negative.  EKGs/Labs/Other Studies Reviewed:    The following studies were reviewed today:  EKG:  EKG ordered today and personally reviewed.  The ekg ordered today demonstrates sinus rhythm bifascicular heart block old  inferior anterior MI  Recent Labs: No results found for requested labs within last 8760 hours.  Recent Lipid Panel No results found for: CHOL, TRIG, HDL, CHOLHDL, VLDL, LDLCALC, LDLDIRECT  Physical Exam:    VS:  BP 140/70 (BP Location: Right Arm, Patient Position: Sitting, Cuff Size: Normal)   Pulse 62   Ht 5\' 9"  (1.753 m)   Wt 189 lb (85.7 kg)   SpO2 98%   BMI 27.91 kg/m     Wt Readings from Last 3 Encounters:  12/31/18 189 lb (85.7 kg)  12/31/17 203 lb (92.1 kg)  11/30/17 200 lb (90.7 kg)     GEN: He looks somewhat chronically ill and older than his age affect is flat mood is depressed well nourished, well developed in no acute distress HEENT: Normal NECK: No JVD; No carotid bruits LYMPHATICS: No lymphadenopathy CARDIAC: RRR, no murmurs, rubs, gallops RESPIRATORY:  Clear to auscultation without rales, wheezing or rhonchi  ABDOMEN: Soft, non-tender, non-distended MUSCULOSKELETAL:  No edema; No deformity  SKIN: Warm and dry NEUROLOGIC:  Alert and oriented x 3 PSYCHIATRIC:  Normal affect    Signed, Shirlee More, MD  12/31/2018 10:28 AM    [facility]

## 2018-12-31 ENCOUNTER — Encounter: Payer: Self-pay | Admitting: Cardiology

## 2018-12-31 ENCOUNTER — Ambulatory Visit (INDEPENDENT_AMBULATORY_CARE_PROVIDER_SITE_OTHER): Payer: Medicare Other | Admitting: Cardiology

## 2018-12-31 ENCOUNTER — Other Ambulatory Visit: Payer: Self-pay

## 2018-12-31 VITALS — BP 140/70 | HR 62 | Ht 69.0 in | Wt 189.0 lb

## 2018-12-31 DIAGNOSIS — E782 Mixed hyperlipidemia: Secondary | ICD-10-CM

## 2018-12-31 DIAGNOSIS — Z950 Presence of cardiac pacemaker: Secondary | ICD-10-CM | POA: Diagnosis not present

## 2018-12-31 DIAGNOSIS — I483 Typical atrial flutter: Secondary | ICD-10-CM | POA: Diagnosis not present

## 2018-12-31 DIAGNOSIS — I10 Essential (primary) hypertension: Secondary | ICD-10-CM | POA: Diagnosis not present

## 2018-12-31 DIAGNOSIS — I25119 Atherosclerotic heart disease of native coronary artery with unspecified angina pectoris: Secondary | ICD-10-CM

## 2018-12-31 DIAGNOSIS — Z789 Other specified health status: Secondary | ICD-10-CM

## 2018-12-31 MED ORDER — EZETIMIBE 10 MG PO TABS: 10.0000 mg | ORAL_TABLET | Freq: Every day | ORAL | 3 refills | Status: DC

## 2018-12-31 NOTE — Patient Instructions (Addendum)
Medication Instructions:  Your physician has recommended you make the following change in your medication:   START ezetimibe (zetia) 10 mg: Take 1 tablet daily   *If you need a refill on your cardiac medications before your next appointment, please call your pharmacy*  Lab Work: Your physician recommends that you return for lab work in 1 month: CMP, lipid panel. Please go to our [place] office for lab work, no appointment needed. Please fast beforehand.   If you have labs (blood work) drawn today and your tests are completely normal, you will receive your results only by: Marland Kitchen MyChart Message (if you have MyChart) OR . A paper copy in the mail If you have any lab test that is abnormal or we need to change your treatment, we will call you to review the results.  Testing/Procedures: You had an EKG today.   Follow-Up: At [facility], you and your health needs are our priority.  As part of our continuing mission to provide you with exceptional heart care, we have created designated Provider Care Teams.  These Care Teams include your primary Cardiologist (physician) and Advanced Practice Providers (APPs -  Physician Assistants and Nurse Practitioners) who all work together to provide you with the care you need, when you need it.  Your next appointment:   6 months  The format for your next appointment:   In Person  Provider:   Shirlee More, MD   Ezetimibe Tablets What is this medicine? EZETIMIBE (ez ET i mibe) blocks the absorption of cholesterol from the stomach. It can help lower blood cholesterol for patients who are at risk of getting heart disease or a stroke. It is only for patients whose cholesterol level is not controlled by diet. This medicine may be used for other purposes; ask your health care provider or pharmacist if you have questions. COMMON BRAND NAME(S): Zetia What should I tell my health care provider before I take this medicine? They need to know if you have any of  these conditions:  liver disease  an unusual or allergic reaction to ezetimibe, medicines, foods, dyes, or preservatives  pregnant or trying to get pregnant  breast-feeding How should I use this medicine? Take this medicine by mouth with a glass of water. Follow the directions on the prescription label. This medicine can be taken with or without food. Take your doses at regular intervals. Do not take your medicine more often than directed. Talk to your pediatrician regarding the use of this medicine in children. Special care may be needed. Overdosage: If you think you have taken too much of this medicine contact a poison control center or emergency room at once. NOTE: This medicine is only for you. Do not share this medicine with others. What if I miss a dose? If you miss a dose, take it as soon as you can. If it is almost time for your next dose, take only that dose. Do not take double or extra doses. What may interact with this medicine? Do not take this medicine with any of the following medications:  fenofibrate  gemfibrozil This medicine may also interact with the following medications:  antacids  cyclosporine  herbal medicines like red yeast rice  other medicines to lower cholesterol or triglycerides This list may not describe all possible interactions. Give your health care provider a list of all the medicines, herbs, non-prescription drugs, or dietary supplements you use. Also tell them if you smoke, drink alcohol, or use illegal drugs. Some items may  interact with your medicine. What should I watch for while using this medicine? Visit your doctor or health care professional for regular checks on your progress. You will need to have your cholesterol levels checked. If you are also taking some other cholesterol medicines, you will also need to have tests to make sure your liver is working properly. Tell your doctor or health care professional if you get any unexplained muscle  pain, tenderness, or weakness, especially if you also have a fever and tiredness. You need to follow a low-cholesterol, low-fat diet while you are taking this medicine. This will decrease your risk of getting heart and blood vessel disease. Exercising and avoiding alcohol and smoking can also help. Ask your doctor or dietician for advice. What side effects may I notice from receiving this medicine? Side effects that you should report to your doctor or health care professional as soon as possible:  allergic reactions like skin rash, itching or hives, swelling of the face, lips, or tongue  dark yellow or Voght urine  unusually weak or tired  yellowing of the skin or eyes Side effects that usually do not require medical attention (report to your doctor or health care professional if they continue or are bothersome):  diarrhea  dizziness  headache  stomach upset or pain This list may not describe all possible side effects. Call your doctor for medical advice about side effects. You may report side effects to FDA at 1-800-FDA-1088. Where should I keep my medicine? Keep out of the reach of children. Store at room temperature between 15 and 30 degrees C (59 and 86 degrees F). Protect from moisture. Keep container tightly closed. Throw away any unused medicine after the expiration date. NOTE: This sheet is a summary. It may not cover all possible information. If you have questions about this medicine, talk to your doctor, pharmacist, or health care provider.  2020 Elsevier/Gold Standard (2011-09-04 15:39:09)

## 2019-03-04 ENCOUNTER — Ambulatory Visit (INDEPENDENT_AMBULATORY_CARE_PROVIDER_SITE_OTHER): Payer: Medicare Other | Admitting: *Deleted

## 2019-03-04 DIAGNOSIS — I480 Paroxysmal atrial fibrillation: Secondary | ICD-10-CM | POA: Diagnosis not present

## 2019-03-04 LAB — CUP PACEART REMOTE DEVICE CHECK
Date Time Interrogation Session: 20201222154715
Implantable Lead Implant Date: 20140813
Implantable Lead Implant Date: 20140813
Implantable Lead Location: 753859
Implantable Lead Location: 753860
Implantable Lead Model: 350974
Implantable Lead Serial Number: 1111
Implantable Lead Serial Number: 2222
Implantable Pulse Generator Implant Date: 20140813
Pulse Gen Serial Number: 68050901

## 2019-03-05 ENCOUNTER — Other Ambulatory Visit: Payer: Self-pay

## 2019-03-05 ENCOUNTER — Ambulatory Visit: Payer: Medicare Other | Admitting: Sports Medicine

## 2019-03-05 ENCOUNTER — Encounter: Payer: Self-pay | Admitting: Sports Medicine

## 2019-03-05 DIAGNOSIS — B351 Tinea unguium: Secondary | ICD-10-CM | POA: Diagnosis not present

## 2019-03-05 DIAGNOSIS — M79675 Pain in left toe(s): Secondary | ICD-10-CM

## 2019-03-05 DIAGNOSIS — I739 Peripheral vascular disease, unspecified: Secondary | ICD-10-CM

## 2019-03-05 DIAGNOSIS — M79674 Pain in right toe(s): Secondary | ICD-10-CM

## 2019-03-05 NOTE — Addendum Note (Signed)
Addended by: Landis Martins T on: 03/05/2019 12:34 PM   Modules accepted: Level of Service

## 2019-03-05 NOTE — Progress Notes (Signed)
Subjective: [name] is a 85 y.o. male patient seen today in office with complaint of mildly painful thickened and elongated toenails; unable to trim. Patient denies history of Diabetes, Neuropathy, or Vascular disease except issues with his sciatic nerve cannot begin to trim his toenails. Patient has no other pedal complaints at this time.   Review of Systems  All other systems reviewed and are negative.    Patient Active Problem List   Diagnosis Date Noted  . Impacted cerumen, bilateral 04/24/2018  . PAF (paroxysmal atrial fibrillation) (HCC) 12/31/2017  . Thrombocytopenia due to drugs 07/16/2017  . Partial small bowel obstruction (HCC) 03/05/2017  . Recurrent incisional hernia 03/05/2017  . History of small bowel obstruction 03/05/2017  . Dizziness 10/05/2016  . Abnormal myocardial perfusion study 09/15/2016  . History of cardiac radiofrequency ablation 09/15/2016  . Hx of CABG 09/15/2016  . Old MI (myocardial infarction) 09/15/2016  . Aortic atherosclerosis (HCC) 02/16/2016  . Anticoagulant long-term use 05/05/2015  . BMI 31.0-31.9,adult 05/05/2015  . Chronic rhinitis 05/05/2015  . Coronary artery disease involving native coronary artery of native heart with angina pectoris (HCC) 05/05/2015  . Elevated PSA 05/05/2015  . GERD (gastroesophageal reflux disease) 05/05/2015  . High risk medication use 05/05/2015  . Lumbar degenerative disc disease 05/05/2015  . Malaise and fatigue 05/05/2015  . Prediabetes 05/05/2015  . Primary osteoarthritis involving multiple joints 05/05/2015  . Typical atrial flutter (HCC) 05/05/2015  . Ventral hernia without obstruction or gangrene 05/05/2015  . Sick sinus syndrome (HCC) 02/17/2015  . Essential hypertension 02/14/2015  . Hyperlipidemia 02/14/2015  . Preoperative cardiovascular examination 02/14/2015  . Presence of cardiac pacemaker 02/14/2015  . RBBB with left anterior fascicular block 02/14/2015    Current Outpatient Medications  on File Prior to Visit  Medication Sig Dispense Refill  . aspirin EC 81 MG tablet Take 1 tablet (81 mg total) by mouth daily. 90 tablet 3  . Multiple Vitamin (MULTI-VITAMINS) TABS Take 1 tablet by mouth daily.    . Omega-3 1000 MG CAPS Take 2 g by mouth daily.    Marland Kitchen omeprazole (PRILOSEC) 20 MG capsule Take 1 capsule by mouth every other day.     . oxyCODONE-acetaminophen (PERCOCET/ROXICET) 5-325 MG tablet Take 1 tablet by mouth every 8 (eight) hours as needed for severe pain.     No current facility-administered medications on file prior to visit.    Allergies  Allergen Reactions  . Amiodarone Nausea And Vomiting    vomiting   . Gabapentin     dizziness    . Metoprolol Other (See Comments)    Drop in heart rate, dizziness.    . Statins     myalgias  . Doxycycline     Unknown to patient   . Neomycin     Unknown to patient    . Niacin     Unknown to patient   . Ramipril     Unknown to patient      Objective: Physical Exam  General: Well developed, nourished, no acute distress, awake, alert and oriented x 3  Vascular: Dorsalis pedis artery 1/4 bilateral, Posterior tibial artery 0/4 bilateral, skin temperature warm to warm proximal to distal bilateral lower extremities, no varicosities, pedal hair present bilateral.  Neurological: Gross sensation present via light touch bilateral.   Dermatological: Skin is warm, dry, and supple bilateral, Nails 1-10 are tender, long, thick, and discolored with mild subungal debris, no webspace macerations present bilateral, no open lesions present bilateral, no callus/corns/hyperkeratotic tissue  present bilateral. No signs of infection bilateral.  Musculoskeletal: Asymptomatic hammertoe boney deformities noted bilateral. Muscular strength within normal limits without painon range of motion. No pain with calf compression bilateral.  Assessment and Plan:  Problem List Items Addressed This Visit    None    Visit Diagnoses    Pain  due to onychomycosis of toenails of both feet    -  Primary   PVD (peripheral vascular disease) ([place])          -Examined patient.  -Discussed treatment options for painful mycotic nails. -Mechanically debrided and reduced mycotic nails with sterile nail nipper and dremel nail file without incident. -Patient to return in 3 months for follow up evaluation or sooner if symptoms worsen.  Landis Martins, DPM

## 2019-06-03 ENCOUNTER — Ambulatory Visit (INDEPENDENT_AMBULATORY_CARE_PROVIDER_SITE_OTHER): Payer: Medicare Other | Admitting: *Deleted

## 2019-06-03 DIAGNOSIS — I480 Paroxysmal atrial fibrillation: Secondary | ICD-10-CM | POA: Diagnosis not present

## 2019-06-03 LAB — CUP PACEART REMOTE DEVICE CHECK
Date Time Interrogation Session: 20210323075942
Implantable Lead Implant Date: 20140813
Implantable Lead Implant Date: 20140813
Implantable Lead Location: 753859
Implantable Lead Location: 753860
Implantable Lead Model: 350
Implantable Lead Serial Number: 1111
Implantable Lead Serial Number: 2222
Implantable Pulse Generator Implant Date: 20140813
Pulse Gen Serial Number: 68050901

## 2019-06-04 NOTE — Progress Notes (Signed)
PPM Remote  

## 2019-06-06 ENCOUNTER — Ambulatory Visit: Payer: Medicare Other | Admitting: Sports Medicine

## 2019-06-06 ENCOUNTER — Encounter: Payer: Self-pay | Admitting: Sports Medicine

## 2019-06-06 ENCOUNTER — Other Ambulatory Visit: Payer: Self-pay

## 2019-06-06 DIAGNOSIS — M79675 Pain in left toe(s): Secondary | ICD-10-CM | POA: Diagnosis not present

## 2019-06-06 DIAGNOSIS — I739 Peripheral vascular disease, unspecified: Secondary | ICD-10-CM | POA: Diagnosis not present

## 2019-06-06 DIAGNOSIS — B351 Tinea unguium: Secondary | ICD-10-CM | POA: Diagnosis not present

## 2019-06-06 DIAGNOSIS — M79674 Pain in right toe(s): Secondary | ICD-10-CM | POA: Diagnosis not present

## 2019-06-06 NOTE — Progress Notes (Signed)
Subjective: [name] is a 86 y.o. male patient seen today in office with complaint of mildly painful thickened and elongated toenails; unable to trim. Reports that his sciatic nerve is acting up leg almost went out. Patient has no other pedal complaints at this time.   Patient Active Problem List   Diagnosis Date Noted  . Impacted cerumen, bilateral 04/24/2018  . PAF (paroxysmal atrial fibrillation) ([place]) 12/31/2017  . Thrombocytopenia due to drugs 07/16/2017  . Partial small bowel obstruction ([place]) 03/05/2017  . Recurrent incisional hernia 03/05/2017  . History of small bowel obstruction 03/05/2017  . Dizziness 10/05/2016  . Abnormal myocardial perfusion study 09/15/2016  . History of cardiac radiofrequency ablation 09/15/2016  . Hx of CABG 09/15/2016  . Old MI (myocardial infarction) 09/15/2016  . Aortic atherosclerosis ([place]) 02/16/2016  . Anticoagulant long-term use 05/05/2015  . BMI 31.0-31.9,adult 05/05/2015  . Chronic rhinitis 05/05/2015  . Coronary artery disease involving native coronary artery of native heart with angina pectoris ([place]) 05/05/2015  . Elevated PSA 05/05/2015  . GERD (gastroesophageal reflux disease) 05/05/2015  . High risk medication use 05/05/2015  . Lumbar degenerative disc disease 05/05/2015  . Malaise and fatigue 05/05/2015  . Prediabetes 05/05/2015  . Primary osteoarthritis involving multiple joints 05/05/2015  . Typical atrial flutter ([place]) 05/05/2015  . Ventral hernia without obstruction or gangrene 05/05/2015  . Sick sinus syndrome ([place]) 02/17/2015  . Essential hypertension 02/14/2015  . Hyperlipidemia 02/14/2015  . Preoperative cardiovascular examination 02/14/2015  . Presence of cardiac pacemaker 02/14/2015  . RBBB with left anterior fascicular block 02/14/2015    Current Outpatient Medications on File Prior to Visit  Medication Sig Dispense Refill  . aspirin EC 81 MG tablet Take 1 tablet (81 mg total) by mouth daily. 90 tablet 3  .  Multiple Vitamin (MULTI-VITAMINS) TABS Take 1 tablet by mouth daily.    . Omega-3 1000 MG CAPS Take 2 g by mouth daily.    Marland Kitchen omeprazole (PRILOSEC) 20 MG capsule Take 1 capsule by mouth every other day.     . oxyCODONE-acetaminophen (PERCOCET/ROXICET) 5-325 MG tablet Take 1 tablet by mouth every 8 (eight) hours as needed for severe pain.     No current facility-administered medications on file prior to visit.    Allergies  Allergen Reactions  . Amiodarone Nausea And Vomiting    vomiting   . Gabapentin     dizziness    . Metoprolol Other (See Comments)    Drop in heart rate, dizziness.    . Statins     myalgias  . Doxycycline     Unknown to patient   . Neomycin     Unknown to patient    . Niacin     Unknown to patient   . Ramipril     Unknown to patient      Objective: Physical Exam  General: Well developed, nourished, no acute distress, awake, alert and oriented x 3  Vascular: Dorsalis pedis artery 1/4 bilateral, Posterior tibial artery 0/4 bilateral, skin temperature warm to cool  proximal to distal bilateral lower extremities, Purple discoloration to feet and varicosities, decreased pedal hair present bilateral.  Neurological: Gross sensation present via light touch bilateral.   Dermatological: Skin is warm, dry, and supple bilateral, Nails 1-10 are tender, long, thick, and discolored with mild subungal debris, no webspace macerations present bilateral, no open lesions present bilateral, no callus/corns/hyperkeratotic tissue present bilateral. No signs of infection bilateral.  Musculoskeletal: Asymptomatic hammertoe boney deformities noted bilateral. Muscular strength  within normal limits without painon range of motion. No pain with calf compression bilateral.  Assessment and Plan:  Problem List Items Addressed This Visit    None    Visit Diagnoses    Pain due to onychomycosis of toenails of both feet    -  Primary   PVD (peripheral vascular disease) (HCC)           -Examined patient.  -Re-Discussed treatment options for painful mycotic nails. -Mechanically debrided and reduced mycotic nails with sterile nail nipper and dremel nail file without incident. -Advised patient to follow up with PCP about sciatic nerve  -Continue with cane for stability in gait -Patient to return in 3 months for follow up evaluation or sooner if symptoms worsen.  Asencion Islam, DPM

## 2019-07-07 NOTE — Progress Notes (Signed)
Cardiology Office Note:    Date:  07/08/2019   ID:  [name], DOB Oct 22, 1933, MRN [id number]  PCP:  Mayer Camel, NP  Cardiologist:  Shirlee More, MD    Referring MD: Bess Harvest*    ASSESSMENT:    1. Dizzy   2. Coronary artery disease involving native coronary artery of native heart with angina pectoris ([place])   3. Presence of cardiac pacemaker   4. Essential hypertension   5. Mixed hyperlipidemia    PLAN:    In order of problems listed above:  1. His problem is in her ear he asked me to try to help him I think you do best with an evaluation physical therapy and hopefully they can help him to regain balance. 2. Stable CAD having no anginal discomfort at this time I would not advise an ischemia evaluation 3. Stable pacemaker function followed in our program is probably dizziness is not related to the device appropriately. 4. BP appears to be at target at this time would not place him on antihypertensive agents 5. Poorly controlled has declined treatment past at his request I will check lipids and afterwards placed on a statin   Next appointment: 6 months   Medication Adjustments/Labs and Tests Ordered: Current medicines are reviewed at length with the patient today.  Concerns regarding medicines are outlined above.  No orders of the defined types were placed in this encounter.  No orders of the defined types were placed in this encounter.   No chief complaint on file.   History of Present Illness:    [name] is a 86 y.o. male with a hx of CAD EF 45 %, CABG in 2006 with LTA  SVG to D1  and second SVG sequential to RCA and Marg[place], atrial flutter with EP ablation in 2014, RBBB, VT and a pacemaker last seen 12/31/2019.  Compliance with diet, lifestyle and medications: Yes  Her proximal taking lipid-lowering therapy and he asked me to check his lipids and he would consider.  This compliant and concern to him his dizziness is postural  but he has no shift in blood pressure 136/80 by me sitting and standing.  I think his problem is inner ear I will refer him to physical therapy for evaluation and at times they can help patients with inner ear dysfunction.  He is having no angina edema shortness of breath palpitation or syncope and his device function is stable followed in my practice.  Past Medical History:  Diagnosis Date  . Abnormal myocardial perfusion study 09/15/2016  . Anticoagulant long-term use 05/05/2015  . Aortic atherosclerosis ([place]) 02/16/2016  . BMI 31.0-31.9,adult 05/05/2015  . CAD in native artery 02/14/2015  . Chronic rhinitis 05/05/2015  . Coronary artery abnormality 02/14/2015  . Coronary artery disease involving coronary bypass graft of native heart without angina pectoris 05/05/2015  . Coronary artery disease involving native coronary artery of native heart with angina pectoris ([place]) 05/05/2015  . Dizziness 10/05/2016  . Elevated PSA 05/05/2015   [name]  . Essential hypertension 02/14/2015  . GERD (gastroesophageal reflux disease) 05/05/2015  . High risk medication use 05/05/2015  . History of cardiac radiofrequency ablation 09/15/2016  . History of small bowel obstruction 03/05/2017  . Hx of CABG 09/15/2016  . Hyperlipidemia 02/14/2015  . Impacted cerumen, bilateral 04/24/2018  . Lumbar degenerative disc disease 05/05/2015  . Malaise and fatigue 05/05/2015   Last Assessment & Plan:  Relevant Hx: Course: Daily Update: Today's Plan:he admits that  this can be prominent for him and he had UA checked to ensure not having infection which he was concerned about and reassured him of this, encouraged him to remain active and his meds contribute to this as well which he is aware of   Electronically signed by: Krystal Clark, NP 08/12/15 1303  . Old MI (myocardial infarction) 09/15/2016  . PAF (paroxysmal atrial fibrillation) (HCC) 12/31/2017   Telemetry detected  . Partial small bowel obstruction (HCC) 03/05/2017  .  Prediabetes 05/05/2015  . Preoperative cardiovascular examination 02/14/2015  . Presence of cardiac pacemaker 02/14/2015   For symptomatic sinus bradycardia after flutter ablation For symptomatic sinus bradycardia after flutter ablation  . Primary osteoarthritis involving multiple joints 05/05/2015   Last Assessment & Plan:  Relevant Hx: Course: Daily Update: Today's Plan:he has known OA to his joints and he has seen chiropractor in the past for this and he states that he was told he had degenerative changes to his back several times. The knee needs to be xrayed to the right with the swelling , he is taking the oxycodone only as needed but it works well he does not feel dizzy whereas the aleve  . RBBB with left anterior fascicular block 02/14/2015  . Recurrent incisional hernia 03/05/2017  . Sick sinus syndrome (HCC) 02/17/2015  . SSS (sick sinus syndrome) (HCC) 02/17/2015  . Thrombocytopenia due to drugs 07/16/2017  . Typical atrial flutter (HCC) 05/05/2015   Last Assessment & Plan:  Warfarin was stopped September 07, 2016 by physicians assistant or [place] [facility] after consistent monitoring showed no recurrence of A. fib for more than 1-1/2 years  . Ventral hernia without obstruction or gangrene 05/05/2015    Past Surgical History:  Procedure Laterality Date  . ATRIAL FLUTTER ABLATION    . COLON SURGERY    . CORONARY ARTERY BYPASS GRAFT    . INSERT / REPLACE / REMOVE PACEMAKER      Current Medications: Current Meds  Medication Sig  . aspirin EC 81 MG tablet Take 1 tablet (81 mg total) by mouth daily.  . Multiple Vitamin (MULTI-VITAMINS) TABS Take 1 tablet by mouth daily.  . Omega-3 1000 MG CAPS Take 2 g by mouth daily.  Marland Kitchen omeprazole (PRILOSEC) 20 MG capsule Take 1 capsule by mouth every other day.   . oxyCODONE-acetaminophen (PERCOCET/ROXICET) 5-325 MG tablet Take 1 tablet by mouth every 8 (eight) hours as needed for severe pain.     Allergies:   Amiodarone, Gabapentin, Metoprolol, Statins,  Doxycycline, Neomycin, Niacin, and Ramipril   Social History   Socioeconomic History  . Marital status: Divorced    Spouse name: Not on file  . Number of children: Not on file  . Years of education: Not on file  . Highest education level: Not on file  Occupational History  . Not on file  Tobacco Use  . Smoking status: Never Smoker  . Smokeless tobacco: Never Used  Substance and Sexual Activity  . Alcohol use: Never  . Drug use: Never  . Sexual activity: Not on file  Other Topics Concern  . Not on file  Social History Narrative  . Not on file   Social Determinants of Health   Financial Resource Strain:   . Difficulty of Paying Living Expenses:   Food Insecurity:   . Worried About Programme researcher, broadcasting/film/video in the Last Year:   . Barista in the Last Year:   Transportation Needs:   . Freight forwarder (Medical):   Marland Kitchen  Lack of Transportation (Non-Medical):   Physical Activity:   . Days of Exercise per Week:   . Minutes of Exercise per Session:   Stress:   . Feeling of Stress :   Social Connections:   . Frequency of Communication with Friends and Family:   . Frequency of Social Gatherings with Friends and Family:   . Attends Religious Services:   . Active Member of Clubs or Organizations:   . Attends Banker Meetings:   Marland Kitchen Marital Status:      Family History: The patient's family history includes AAA (abdominal aortic aneurysm) in his daughter; Diabetes in his daughter and sister; Hyperlipidemia in his daughter; Hypertension in his daughter and daughter; Migraines in his daughter; Rheum arthritis in his daughter. There is no history of Heart attack, Heart disease, or Heart failure. ROS:   Please see the history of present illness.    All other systems reviewed and are negative.  EKGs/Labs/Other Studies Reviewed:    The following studies were reviewed today:   His most recent pacemaker check 06/03/1999 dependently reviewed and shows normal battery  and lead parameters without mode switching or arrhythmia Recent Labs: 2020:  Physical Exam:    VS:  BP (!) 158/90 (BP Location: Right Arm, Patient Position: Sitting, Cuff Size: Normal)   Pulse (!) 102   Temp 98.7 F (37.1 C)   Ht 5\' 9"  (1.753 m)   Wt 194 lb 6.4 oz (88.2 kg)   SpO2 96%   BMI 28.71 kg/m     Wt Readings from Last 3 Encounters:  07/08/19 194 lb 6.4 oz (88.2 kg)  12/31/18 189 lb (85.7 kg)  12/31/17 203 lb (92.1 kg)     GEN:  Well nourished, well developed in no acute distress HEENT: Normal NECK: No JVD; No carotid bruits LYMPHATICS: No lymphadenopathy CARDIAC: RRR, no murmurs, rubs, gallops RESPIRATORY:  Clear to auscultation without rales, wheezing or rhonchi  ABDOMEN: Soft, non-tender, non-distended MUSCULOSKELETAL:  No edema; No deformity  SKIN: Warm and dry NEUROLOGIC:  Alert and oriented x 3 PSYCHIATRIC:  Normal affect    Signed, 01/02/18, MD  07/08/2019 9:20 AM    [facility]

## 2019-07-08 ENCOUNTER — Other Ambulatory Visit: Payer: Self-pay

## 2019-07-08 ENCOUNTER — Ambulatory Visit: Payer: Medicare Other | Admitting: Cardiology

## 2019-07-08 VITALS — BP 158/90 | HR 102 | Temp 98.7°F | Ht 69.0 in | Wt 194.4 lb

## 2019-07-08 DIAGNOSIS — E782 Mixed hyperlipidemia: Secondary | ICD-10-CM

## 2019-07-08 DIAGNOSIS — I25119 Atherosclerotic heart disease of native coronary artery with unspecified angina pectoris: Secondary | ICD-10-CM

## 2019-07-08 DIAGNOSIS — I10 Essential (primary) hypertension: Secondary | ICD-10-CM

## 2019-07-08 DIAGNOSIS — Z950 Presence of cardiac pacemaker: Secondary | ICD-10-CM | POA: Diagnosis not present

## 2019-07-08 DIAGNOSIS — R42 Dizziness and giddiness: Secondary | ICD-10-CM

## 2019-07-08 LAB — LIPID PANEL
Chol/HDL Ratio: 3.5 ratio (ref 0.0–5.0)
Cholesterol, Total: 151 mg/dL (ref 100–199)
HDL: 43 mg/dL (ref >39)
LDL Chol Calc (NIH): 88 mg/dL (ref 0–99)
Triglycerides: 109 mg/dL (ref 0–149)
VLDL Cholesterol Cal: 20 mg/dL (ref 5–40)

## 2019-07-08 LAB — COMPREHENSIVE METABOLIC PANEL
ALT: 19 IU/L (ref 0–44)
AST: 26 IU/L (ref 0–40)
Albumin/Globulin Ratio: 1.8 (ref 1.2–2.2)
Albumin: 4.2 g/dL (ref 3.6–4.6)
Alkaline Phosphatase: 100 IU/L (ref 39–117)
BUN/Creatinine Ratio: 23 (ref 10–24)
BUN: 21 mg/dL (ref 8–27)
Bilirubin Total: 0.6 mg/dL (ref 0.0–1.2)
CO2: 23 mmol/L (ref 20–29)
Calcium: 9.2 mg/dL (ref 8.6–10.2)
Chloride: 103 mmol/L (ref 96–106)
Creatinine, Ser: 0.92 mg/dL (ref 0.76–1.27)
GFR calc Af Amer: 89 mL/min/{1.73_m2} (ref >59)
GFR calc non Af Amer: 77 mL/min/{1.73_m2} (ref >59)
Globulin, Total: 2.4 g/dL (ref 1.5–4.5)
Glucose: 96 mg/dL (ref 65–99)
Potassium: 4.4 mmol/L (ref 3.5–5.2)
Sodium: 140 mmol/L (ref 134–144)
Total Protein: 6.6 g/dL (ref 6.0–8.5)

## 2019-07-08 LAB — TSH: TSH: 1.67 u[IU]/mL (ref 0.450–4.500)

## 2019-07-08 NOTE — Patient Instructions (Addendum)
Medication Instructions:   Your physician recommends that you continue on your current medications as directed. Please refer to the Current Medication list given to you today.  *If you need a refill on your cardiac medications before your next appointment, please call your pharmacy*   Lab Work: Your physician recommends that you return for lab work in: TODAY CMP, TSH, Lipids If you have labs (blood work) drawn today and your tests are completely normal, you will receive your results only by: Marland Kitchen MyChart Message (if you have MyChart) OR . A paper copy in the mail If you have any lab test that is abnormal or we need to change your treatment, we will call you to review the results.   Testing/Procedures: None   Follow-Up: At [facility], you and your health needs are our priority.  As part of our continuing mission to provide you with exceptional heart care, we have created designated Provider Care Teams.  These Care Teams include your primary Cardiologist (physician) and Advanced Practice Providers (APPs -  Physician Assistants and Nurse Practitioners) who all work together to provide you with the care you need, when you need it.  We recommend signing up for the patient portal called "MyChart".  Sign up information is provided on this After Visit Summary.  MyChart is used to connect with patients for Virtual Visits (Telemedicine).  Patients are able to view lab/test results, encounter notes, upcoming appointments, etc.  Non-urgent messages can be sent to your provider as well.   To learn more about what you can do with MyChart, go to [email or web address].    Your next appointment:   6 month(s)  The format for your next appointment:   In Person  Provider:   Norman Herrlich, MD   Other Instructions We have put in a referral for you to go to PT at [facility]. They will call you to schedule this appointment.

## 2019-07-09 ENCOUNTER — Telehealth: Payer: Self-pay

## 2019-07-09 NOTE — Telephone Encounter (Signed)
Left message on patients voicemail to please return our call.   

## 2019-07-09 NOTE — Telephone Encounter (Signed)
Spoke with patient regarding results and recommendation.  Patient verbalizes understanding and is agreeable to plan of care. Advised patient to call back with any issues or concerns.  

## 2019-07-09 NOTE — Telephone Encounter (Signed)
Follow up  Pt returning call, verified phone number. He said he will have phone next to him

## 2019-07-09 NOTE — Telephone Encounter (Signed)
Just tried calling the patient back. No answer and it went straight to voicemail.

## 2019-07-09 NOTE — Telephone Encounter (Signed)
Patient is returning call.  °

## 2019-07-09 NOTE — Telephone Encounter (Signed)
-----   Message from Baldo Daub, MD sent at 07/09/2019  7:56 AM EDT ----- Normal or stable result  No changes

## 2019-07-28 DIAGNOSIS — H8113 Benign paroxysmal vertigo, bilateral: Secondary | ICD-10-CM

## 2019-07-28 HISTORY — DX: Benign paroxysmal vertigo, bilateral: H81.13

## 2019-09-02 ENCOUNTER — Ambulatory Visit (INDEPENDENT_AMBULATORY_CARE_PROVIDER_SITE_OTHER): Payer: Medicare Other | Admitting: *Deleted

## 2019-09-02 DIAGNOSIS — I480 Paroxysmal atrial fibrillation: Secondary | ICD-10-CM | POA: Diagnosis not present

## 2019-09-02 LAB — CUP PACEART REMOTE DEVICE CHECK
Date Time Interrogation Session: 20210622063326
Implantable Lead Implant Date: 20140813
Implantable Lead Implant Date: 20140813
Implantable Lead Location: 753859
Implantable Lead Location: 753860
Implantable Lead Model: 350
Implantable Lead Serial Number: 1111
Implantable Lead Serial Number: 2222
Implantable Pulse Generator Implant Date: 20140813
Pulse Gen Serial Number: 68050901

## 2019-09-03 NOTE — Progress Notes (Signed)
Remote pacemaker transmission.   

## 2019-09-12 ENCOUNTER — Ambulatory Visit: Payer: Medicare Other | Admitting: Sports Medicine

## 2019-09-12 ENCOUNTER — Other Ambulatory Visit: Payer: Self-pay

## 2019-09-12 ENCOUNTER — Encounter: Payer: Self-pay | Admitting: Sports Medicine

## 2019-09-12 DIAGNOSIS — M79674 Pain in right toe(s): Secondary | ICD-10-CM

## 2019-09-12 DIAGNOSIS — B351 Tinea unguium: Secondary | ICD-10-CM

## 2019-09-12 DIAGNOSIS — I739 Peripheral vascular disease, unspecified: Secondary | ICD-10-CM | POA: Diagnosis not present

## 2019-09-12 DIAGNOSIS — M79675 Pain in left toe(s): Secondary | ICD-10-CM

## 2019-09-12 NOTE — Progress Notes (Signed)
Subjective: [name] is a 86 y.o. male patient seen today in office with complaint of mildly painful thickened and elongated toenails; unable to trim. Reports that his sciatic nerve still acts up and sometimes has back issues but otherwise ok. Patient has no other pedal complaints at this time.   Patient Active Problem List   Diagnosis Date Noted  . Impacted cerumen, bilateral 04/24/2018  . PAF (paroxysmal atrial fibrillation) (HCC) 12/31/2017  . Thrombocytopenia due to drugs 07/16/2017  . Partial small bowel obstruction (HCC) 03/05/2017  . Recurrent incisional hernia 03/05/2017  . History of small bowel obstruction 03/05/2017  . Dizziness 10/05/2016  . Abnormal myocardial perfusion study 09/15/2016  . History of cardiac radiofrequency ablation 09/15/2016  . Hx of CABG 09/15/2016  . Old MI (myocardial infarction) 09/15/2016  . Aortic atherosclerosis (HCC) 02/16/2016  . Anticoagulant long-term use 05/05/2015  . BMI 31.0-31.9,adult 05/05/2015  . Chronic rhinitis 05/05/2015  . Coronary artery disease involving native coronary artery of native heart with angina pectoris (HCC) 05/05/2015  . Elevated PSA 05/05/2015  . GERD (gastroesophageal reflux disease) 05/05/2015  . High risk medication use 05/05/2015  . Lumbar degenerative disc disease 05/05/2015  . Malaise and fatigue 05/05/2015  . Prediabetes 05/05/2015  . Primary osteoarthritis involving multiple joints 05/05/2015  . Typical atrial flutter (HCC) 05/05/2015  . Ventral hernia without obstruction or gangrene 05/05/2015  . Sick sinus syndrome (HCC) 02/17/2015  . Essential hypertension 02/14/2015  . Hyperlipidemia 02/14/2015  . Preoperative cardiovascular examination 02/14/2015  . Presence of cardiac pacemaker 02/14/2015  . RBBB with left anterior fascicular block 02/14/2015    Current Outpatient Medications on File Prior to Visit  Medication Sig Dispense Refill  . aspirin EC 81 MG tablet Take 1 tablet (81 mg total) by mouth  daily. 90 tablet 3  . Multiple Vitamin (MULTI-VITAMINS) TABS Take 1 tablet by mouth daily.    . Omega-3 1000 MG CAPS Take 2 g by mouth daily.    Marland Kitchen omeprazole (PRILOSEC) 20 MG capsule Take 1 capsule by mouth every other day.     . oxyCODONE-acetaminophen (PERCOCET/ROXICET) 5-325 MG tablet Take 1 tablet by mouth every 8 (eight) hours as needed for severe pain.     No current facility-administered medications on file prior to visit.    Allergies  Allergen Reactions  . Amiodarone Nausea And Vomiting    vomiting   . Gabapentin     dizziness    . Metoprolol Other (See Comments)    Drop in heart rate, dizziness.    . Statins     myalgias  . Doxycycline     Unknown to patient   . Neomycin     Unknown to patient    . Niacin     Unknown to patient   . Ramipril     Unknown to patient      Objective: Physical Exam  General: Well developed, nourished, no acute distress, awake, alert and oriented x 3  Vascular: Dorsalis pedis artery 1/4 bilateral, Posterior tibial artery 0/4 bilateral, skin temperature warm to cool  proximal to distal bilateral lower extremities, Purple discoloration to feet and varicosities, decreased pedal hair present bilateral.  Neurological: Gross sensation present via light touch bilateral.   Dermatological: Skin is warm, dry, and supple bilateral, Nails 1-10 are tender, long, thick, and discolored with mild subungal debris, no webspace macerations present bilateral, no open lesions present bilateral, no callus/corns/hyperkeratotic tissue present bilateral. No signs of infection bilateral.  Musculoskeletal: Asymptomatic hammertoe boney deformities  noted bilateral. Muscular strength within normal limits without painon range of motion. No pain with calf compression bilateral.  Assessment and Plan:  Problem List Items Addressed This Visit    None    Visit Diagnoses    Pain due to onychomycosis of toenails of both feet    -  Primary   PVD (peripheral  vascular disease) (HCC)          -Examined patient.  -Re-Discussed treatment options for painful mycotic nails. -Mechanically debrided and reduced mycotic nails with sterile nail nipper and dremel nail file without incident. -Advised patient to continue with good supportive shoes and cane -Patient to return in 3-4 months for follow up evaluation or sooner if symptoms worsen.  Asencion Islam, DPM

## 2019-12-02 ENCOUNTER — Ambulatory Visit (INDEPENDENT_AMBULATORY_CARE_PROVIDER_SITE_OTHER): Payer: Medicare Other | Admitting: *Deleted

## 2019-12-02 DIAGNOSIS — I495 Sick sinus syndrome: Secondary | ICD-10-CM | POA: Diagnosis not present

## 2019-12-02 LAB — CUP PACEART REMOTE DEVICE CHECK
Date Time Interrogation Session: 20210921070018
Implantable Lead Implant Date: 20140813
Implantable Lead Implant Date: 20140813
Implantable Lead Location: 753859
Implantable Lead Location: 753860
Implantable Lead Model: 350
Implantable Lead Serial Number: 1111
Implantable Lead Serial Number: 2222
Implantable Pulse Generator Implant Date: 20140813
Pulse Gen Serial Number: 68050901

## 2019-12-04 NOTE — Progress Notes (Signed)
Remote pacemaker transmission.   

## 2020-01-13 ENCOUNTER — Other Ambulatory Visit: Payer: Self-pay

## 2020-01-13 ENCOUNTER — Encounter: Payer: Self-pay | Admitting: Sports Medicine

## 2020-01-13 ENCOUNTER — Ambulatory Visit: Payer: Medicare Other | Admitting: Sports Medicine

## 2020-01-13 DIAGNOSIS — M79675 Pain in left toe(s): Secondary | ICD-10-CM

## 2020-01-13 DIAGNOSIS — I739 Peripheral vascular disease, unspecified: Secondary | ICD-10-CM | POA: Diagnosis not present

## 2020-01-13 DIAGNOSIS — B351 Tinea unguium: Secondary | ICD-10-CM | POA: Diagnosis not present

## 2020-01-13 DIAGNOSIS — L988 Other specified disorders of the skin and subcutaneous tissue: Secondary | ICD-10-CM

## 2020-01-13 DIAGNOSIS — M79674 Pain in right toe(s): Secondary | ICD-10-CM

## 2020-01-13 NOTE — Progress Notes (Signed)
Subjective: [name] is a 86 y.o. male patient seen today in office with complaint of mildly painful thickened and elongated toenails; unable to trim. Reports that his sciatic nerve still acts up but has a new pain at the fourth and fifth toes reports he thinks he has a cut in between the toes is been very raw and sore, he started using Polysporin with some relief.  Patient denies any active drainage redness warmth swelling or any other acute signs or symptoms of infection at this time.  Patient Active Problem List   Diagnosis Date Noted  . Benign paroxysmal vertigo of both ears 07/28/2019  . Impacted cerumen, bilateral 04/24/2018  . PAF (paroxysmal atrial fibrillation) (HCC) 12/31/2017  . Thrombocytopenia due to drugs 07/16/2017  . Partial small bowel obstruction (HCC) 03/05/2017  . Recurrent incisional hernia 03/05/2017  . History of small bowel obstruction 03/05/2017  . Dizziness 10/05/2016  . Abnormal myocardial perfusion study 09/15/2016  . History of cardiac radiofrequency ablation 09/15/2016  . Hx of CABG 09/15/2016  . Old MI (myocardial infarction) 09/15/2016  . Aortic atherosclerosis (HCC) 02/16/2016  . Anticoagulant long-term use 05/05/2015  . BMI 31.0-31.9,adult 05/05/2015  . Chronic rhinitis 05/05/2015  . Coronary artery disease involving native coronary artery of native heart with angina pectoris (HCC) 05/05/2015  . Elevated PSA 05/05/2015  . GERD (gastroesophageal reflux disease) 05/05/2015  . High risk medication use 05/05/2015  . Lumbar degenerative disc disease 05/05/2015  . Malaise and fatigue 05/05/2015  . Prediabetes 05/05/2015  . Primary osteoarthritis involving multiple joints 05/05/2015  . Typical atrial flutter (HCC) 05/05/2015  . Ventral hernia without obstruction or gangrene 05/05/2015  . Sick sinus syndrome (HCC) 02/17/2015  . Essential hypertension 02/14/2015  . Hyperlipidemia 02/14/2015  . Preoperative cardiovascular examination 02/14/2015  .  Presence of cardiac pacemaker 02/14/2015  . RBBB with left anterior fascicular block 02/14/2015    Current Outpatient Medications on File Prior to Visit  Medication Sig Dispense Refill  . aspirin EC 81 MG tablet Take 1 tablet (81 mg total) by mouth daily. 90 tablet 3  . Multiple Vitamin (MULTI-VITAMINS) TABS Take 1 tablet by mouth daily.    . Omega-3 1000 MG CAPS Take 2 g by mouth daily.    Marland Kitchen omeprazole (PRILOSEC) 20 MG capsule Take 1 capsule by mouth every other day.     . oxyCODONE-acetaminophen (PERCOCET/ROXICET) 5-325 MG tablet Take 1 tablet by mouth every 8 (eight) hours as needed for severe pain.     No current facility-administered medications on file prior to visit.    Allergies  Allergen Reactions  . Amiodarone Nausea And Vomiting    vomiting   . Gabapentin     dizziness    . Metoprolol Other (See Comments)    Drop in heart rate, dizziness.    . Statins     myalgias  . Doxycycline     Unknown to patient   . Neomycin     Unknown to patient    . Niacin     Unknown to patient   . Ramipril     Unknown to patient      Objective: Physical Exam  General: Well developed, nourished, no acute distress, awake, alert and oriented x 3  Vascular: Dorsalis pedis artery 1/4 bilateral, Posterior tibial artery 0/4 bilateral, skin temperature warm to cool  proximal to distal bilateral lower extremities, Purple discoloration to feet and varicosities, decreased pedal hair present bilateral.  Neurological: Gross sensation present via light touch bilateral.  Dermatological: Skin is cool and supple bilateral, Nails 1-10 are tender, long, thick, and discolored with mild subungal debris, right fourth webspace maceration present, no open lesions present bilateral, no callus/corns/hyperkeratotic tissue present bilateral. No signs of infection bilateral.  Musculoskeletal: Asymptomatic hammertoe boney deformities noted bilateral. Muscular strength within normal limits without  painon range of motion. No pain with calf compression bilateral.  Assessment and Plan:  Problem List Items Addressed This Visit    None    Visit Diagnoses    Pain due to onychomycosis of toenails of both feet    -  Primary   PVD (peripheral vascular disease) (HCC)       Skin maceration          -Examined patient.  -Re-Discussed treatment options for painful mycotic nails. -Mechanically debrided and reduced mycotic nails with sterile nail nipper and dremel nail file without incident. -Apply Betadine in between the right fourth and fifth toes and advised patient to do the same daily until maceration has resolved -Advised patient to continue with good supportive shoes and cane -Patient to return in 3-4 months for follow up evaluation or sooner if symptoms worsen.  Asencion Islam, DPM

## 2020-03-03 ENCOUNTER — Ambulatory Visit (INDEPENDENT_AMBULATORY_CARE_PROVIDER_SITE_OTHER): Payer: Medicare Other

## 2020-03-03 DIAGNOSIS — I495 Sick sinus syndrome: Secondary | ICD-10-CM

## 2020-03-05 LAB — CUP PACEART REMOTE DEVICE CHECK
Date Time Interrogation Session: 20211222081139
Implantable Lead Implant Date: 20140813
Implantable Lead Implant Date: 20140813
Implantable Lead Location: 753859
Implantable Lead Location: 753860
Implantable Lead Model: 350
Implantable Lead Serial Number: 1111
Implantable Lead Serial Number: 2222
Implantable Pulse Generator Implant Date: 20140813
Pulse Gen Serial Number: 68050901

## 2020-03-17 NOTE — Progress Notes (Signed)
Remote pacemaker transmission.   

## 2020-04-14 ENCOUNTER — Ambulatory Visit: Payer: Medicare Other | Admitting: Sports Medicine

## 2020-04-29 ENCOUNTER — Encounter: Payer: Self-pay | Admitting: Podiatry

## 2020-04-29 ENCOUNTER — Ambulatory Visit: Payer: Medicare Other | Admitting: Podiatry

## 2020-04-29 ENCOUNTER — Other Ambulatory Visit: Payer: Self-pay

## 2020-04-29 DIAGNOSIS — I739 Peripheral vascular disease, unspecified: Secondary | ICD-10-CM

## 2020-04-29 DIAGNOSIS — M79674 Pain in right toe(s): Secondary | ICD-10-CM

## 2020-04-29 DIAGNOSIS — M79675 Pain in left toe(s): Secondary | ICD-10-CM | POA: Diagnosis not present

## 2020-04-29 DIAGNOSIS — L97512 Non-pressure chronic ulcer of other part of right foot with fat layer exposed: Secondary | ICD-10-CM

## 2020-04-29 DIAGNOSIS — B351 Tinea unguium: Secondary | ICD-10-CM

## 2020-04-30 ENCOUNTER — Ambulatory Visit (INDEPENDENT_AMBULATORY_CARE_PROVIDER_SITE_OTHER): Payer: Medicare Other | Admitting: Sports Medicine

## 2020-04-30 ENCOUNTER — Encounter: Payer: Self-pay | Admitting: Sports Medicine

## 2020-04-30 DIAGNOSIS — L97511 Non-pressure chronic ulcer of other part of right foot limited to breakdown of skin: Secondary | ICD-10-CM

## 2020-04-30 DIAGNOSIS — I739 Peripheral vascular disease, unspecified: Secondary | ICD-10-CM

## 2020-04-30 DIAGNOSIS — L988 Other specified disorders of the skin and subcutaneous tissue: Secondary | ICD-10-CM

## 2020-04-30 NOTE — Progress Notes (Signed)
Subjective: [name] is a 87 y.o. male patient seen in office for evaluation of ulceration of the right foot Patient has a history of PVD and was seen yesterday.  Patient is changing the dressing using bandaid at foot reports that he thinks that it is doing ok, Denies nausea/fever/vomiting/chills/night sweats/shortness of breath/pain except when he was wearing custom insole so he didn't put it on this morning and things felt better. Patient has no other pedal complaints at this time.  Patient Active Problem List   Diagnosis Date Noted  . Benign paroxysmal vertigo of both ears 07/28/2019  . Impacted cerumen, bilateral 04/24/2018  . PAF (paroxysmal atrial fibrillation) (HCC) 12/31/2017  . Thrombocytopenia due to drugs 07/16/2017  . Partial small bowel obstruction (HCC) 03/05/2017  . Recurrent incisional hernia 03/05/2017  . History of small bowel obstruction 03/05/2017  . Dizziness 10/05/2016  . Abnormal myocardial perfusion study 09/15/2016  . History of cardiac radiofrequency ablation 09/15/2016  . Hx of CABG 09/15/2016  . Old MI (myocardial infarction) 09/15/2016  . Aortic atherosclerosis (HCC) 02/16/2016  . Anticoagulant long-term use 05/05/2015  . BMI 31.0-31.9,adult 05/05/2015  . Chronic rhinitis 05/05/2015  . Coronary artery disease involving native coronary artery of native heart with angina pectoris (HCC) 05/05/2015  . Elevated PSA 05/05/2015  . GERD (gastroesophageal reflux disease) 05/05/2015  . High risk medication use 05/05/2015  . Lumbar degenerative disc disease 05/05/2015  . Malaise and fatigue 05/05/2015  . Prediabetes 05/05/2015  . Primary osteoarthritis involving multiple joints 05/05/2015  . Typical atrial flutter (HCC) 05/05/2015  . Ventral hernia without obstruction or gangrene 05/05/2015  . Sick sinus syndrome (HCC) 02/17/2015  . Essential hypertension 02/14/2015  . Hyperlipidemia 02/14/2015  . Preoperative cardiovascular examination 02/14/2015  . Presence  of cardiac pacemaker 02/14/2015  . RBBB with left anterior fascicular block 02/14/2015   Current Outpatient Medications on File Prior to Visit  Medication Sig Dispense Refill  . Multiple Vitamin (MULTI-VITAMINS) TABS Take 1 tablet by mouth daily.    . Omega-3 1000 MG CAPS Take 2 g by mouth daily.    Marland Kitchen omeprazole (PRILOSEC) 20 MG capsule Take 1 capsule by mouth every other day.     . oxyCODONE-acetaminophen (PERCOCET/ROXICET) 5-325 MG tablet Take 1 tablet by mouth every 8 (eight) hours as needed for severe pain.    Marland Kitchen timolol (TIMOPTIC) 0.25 % ophthalmic solution Place 1 drop into the right eye every morning.     No current facility-administered medications on file prior to visit.   Allergies  Allergen Reactions  . Amiodarone Nausea And Vomiting    vomiting   . Gabapentin     dizziness    . Metoprolol Other (See Comments)    Drop in heart rate, dizziness.    . Statins     myalgias  . Doxycycline     Unknown to patient   . Neomycin     Unknown to patient    . Niacin     Unknown to patient   . Ramipril     Unknown to patient      Recent Results (from the past 2160 hour(s))  CUP PACEART REMOTE DEVICE CHECK     Status: None   Collection Time: 03/03/20  8:11 AM  Result Value Ref Range   Pulse Generator Manufacturer [employer]    Date Time Interrogation Session [phone]    Pulse Gen Model Rudene Christians Gen Serial Number [id number]    [facility] Name [facility]  Implantable Pulse Generator Type Implantable Pulse Generator    Implantable Pulse Generator Implant Date [id number]    Implantable Lead Manufacturer [employer]    Implantable Lead Model 350 974 Setrox S S 53    Implantable Lead Serial Number [id number]    Implantable Lead Implant Date [id number]    Implantable Lead Location Detail 1 UNKNOWN    Implantable Lead Location [id number]    Implantable Lead Manufacturer [facility]    Implantable Lead Model 1888TC Tendril ST Optim    Implantable Lead Serial Number [id number]    Implantable  Lead Implant Date [id number]    Implantable Lead Location Detail 1 UNKNOWN    Implantable Lead Location [id number]     Objective: There were no vitals filed for this visit.  General: Patient is awake, alert, oriented x 3 and in no acute distress.  Dermatology: Skin is warm and dry bilateral with a partial thickness ulceration present  Right medial foot at TN joint, Ulceration measures 0.5cm x 0.3cm x 0.2 cm. There is a  Minimally keratotic border with a granular base. The ulceration does not  probe to bone. There is no malodor, no active drainage, +blanchable erythema, trace edema. + maceration at 4th webspace on right.   Vascular: Dorsalis Pedis pulse = 1/4 Bilateral,  Posterior Tibial pulse = 0/4 Bilateral,  Capillary Fill Time < 5 seconds  Neurologic: Protective sensation diminished bilateral .   Musculosketal: + Pes planus deformity.  No Pain with palpation to ulcerated area. No pain with compression to calves bilateral. No gross bony deformities noted bilateral.   No results for input(s): GRAMSTAIN, LABORGA in the last 8760 hours.  Assessment and Plan:  Problem List Items Addressed This Visit   None   Visit Diagnoses    Foot ulcer, right, limited to breakdown of skin (HCC)    -  Primary   Skin maceration       PVD (peripheral vascular disease) (HCC)           -Examined patient and discussed the progression of the wound and treatment alternatives. - Excisionally dedbrided ulceration at right medial foot to healthy bleeding borders removing nonviable tissue using a sterile chisel blade. Wound measures post debridement as above. Wound was debrided to the level of the dermis with viable wound base exposed to promote healing. Hemostasis was achieved with manuel pressure. Patient tolerated procedure well without any discomfort or anesthesia necessary for this wound debridement.  -Wound culture was obtained we will call patient if there is a need for antibiotics -Applied medihoney and  dry sterile dressing and instructed patient to continue with daily dressings at home consisting of same daily -Continue with Betadine in between toes and to dry well at area of maceration at right fourth and fifth toes - Advised patient to go to the ER or return to office if the wound worsens or if constitutional symptoms are present. -Patient to return to office in 7 to 10 days for follow up care and evaluation or sooner if problems arise.  If the wound is failing to improve we will also x-ray at next visit and order vascular studies.  Asencion Islam, DPM

## 2020-05-02 NOTE — Progress Notes (Signed)
  Subjective:  Patient ID: [name], male    DOB: October 18, 1933,  MRN: [id number]  87 y.o. male presents for at risk foot care. Patient has h/o PAD.  Pain interferes with ambulation. Aggravating factors include wearing enclosed shoe gear. Pain is relieved with periodic professional debridement.  Patient states he developed a wound on right foot. States he was wearing his brace and area became painful, so he stopped wearing it. He denies any redness, drainage or swelling. Denies any fever, chills, night sweats, nausea or vomiting.  Allergies  Allergen Reactions  . Amiodarone Nausea And Vomiting    vomiting   . Gabapentin     dizziness    . Metoprolol Other (See Comments)    Drop in heart rate, dizziness.    . Statins     myalgias  . Doxycycline     Unknown to patient   . Neomycin     Unknown to patient    . Niacin     Unknown to patient   . Ramipril     Unknown to patient      Review of Systems: Negative except as noted in the HPI.  Objective:   Constitutional Pt is a pleasant 87 y.o. Caucasian male in NAD. AAO x 3.   Vascular Capillary fill time to digits delayed b/l lower extremities. Faintly palpable DP pulse(s) b/l lower extremities. Nonpalpable PT pulse(s) b/l lower extremities. Pedal hair absent. Lower extremity skin temperature gradient warm to cool. No pain with calf compression b/l. Evidence of chronic venous insufficiency b/l lower extremities. No ischemia or gangrene noted b/l lower extremities.  Neurologic Protective sensation intact 5/5 intact bilaterally with 10g monofilament b/l.  Dermatologic Interdigital maceration noted webspace(s) 1-4 b/l. No blistering, no weeping, no open wounds. Toenails 1-5 b/l elongated, discolored, dystrophic, thickened, crumbly with subungual debris and tenderness to dorsal palpation. Open wound noted medial aspect right medial ankle. Measures 0.5 x 0.3 x 0.2 cm. Area is annular in shape with pink granular base. No surrounding  erythema, no edema, no purulence.  Orthopedic: Normal muscle strength 5/5 to all lower extremity muscle groups bilaterally. No pain crepitus or joint limitation noted with ROM b/l. No gross bony deformities bilaterally.   Radiographs: None Assessment:   1. Pain due to onychomycosis of toenails of both feet   2. Ulcer of right foot with fat layer exposed (HCC)   3. PVD (peripheral vascular disease) (HCC)    Plan:  Patient was evaluated and treated and all questions answered.  Onychomycosis with pain -Nails palliatively debridement as below. -Educated on self-care  Procedure: Nail Debridement Rationale: Pain Type of Debridement: manual, sharp debridement. Instrumentation: Nail nipper, rotary burr. Number of Nails: 10  -Examined patient. -Patient to continue soft, supportive shoe gear daily. -Toenails 1-5 b/l were debrided in length and girth with sterile nail nippers and dremel without iatrogenic bleeding. -Cleansed ulcer right foot with wound cleanser. Applied dressing with Medihoney and Promogran. He will see Dr. Marylene Land on tomorrow. -Patient to report any pedal injuries to medical professional immediately. -Patient/POA to call should there be question/concern in the interim.  Return in about 3 months (around 07/27/2020).  Freddie Breech, DPM

## 2020-05-04 LAB — WOUND CULTURE

## 2020-05-05 LAB — WOUND CULTURE

## 2020-05-12 ENCOUNTER — Encounter: Payer: Self-pay | Admitting: Sports Medicine

## 2020-05-12 ENCOUNTER — Other Ambulatory Visit: Payer: Self-pay

## 2020-05-12 ENCOUNTER — Ambulatory Visit: Payer: Medicare Other | Admitting: Sports Medicine

## 2020-05-12 ENCOUNTER — Ambulatory Visit (INDEPENDENT_AMBULATORY_CARE_PROVIDER_SITE_OTHER): Payer: Medicare Other

## 2020-05-12 ENCOUNTER — Other Ambulatory Visit: Payer: Self-pay | Admitting: Sports Medicine

## 2020-05-12 DIAGNOSIS — L988 Other specified disorders of the skin and subcutaneous tissue: Secondary | ICD-10-CM | POA: Diagnosis not present

## 2020-05-12 DIAGNOSIS — L97511 Non-pressure chronic ulcer of other part of right foot limited to breakdown of skin: Secondary | ICD-10-CM

## 2020-05-12 DIAGNOSIS — I739 Peripheral vascular disease, unspecified: Secondary | ICD-10-CM

## 2020-05-12 NOTE — Progress Notes (Signed)
Subjective: TYLIK TREESE is a 87 y.o. male patient seen in office for follow-up evaluation of ulceration of the right foot.  Patient is changing the dressing using Polysporin and reports that he could not get the Medihoney out of the container and bandaid reports pain at night 8 out of 10, unchanged redness and swelling, denies nausea/fever/vomiting/chills/night sweats/shortness of breath.  Patient has no other pedal complaints at this time.  Patient Active Problem List   Diagnosis Date Noted  . Benign paroxysmal vertigo of both ears 07/28/2019  . Impacted cerumen, bilateral 04/24/2018  . PAF (paroxysmal atrial fibrillation) (HCC) 12/31/2017  . Thrombocytopenia due to drugs 07/16/2017  . Partial small bowel obstruction (HCC) 03/05/2017  . Recurrent incisional hernia 03/05/2017  . History of small bowel obstruction 03/05/2017  . Dizziness 10/05/2016  . Abnormal myocardial perfusion study 09/15/2016  . History of cardiac radiofrequency ablation 09/15/2016  . Hx of CABG 09/15/2016  . Old MI (myocardial infarction) 09/15/2016  . Aortic atherosclerosis (HCC) 02/16/2016  . Anticoagulant long-term use 05/05/2015  . BMI 31.0-31.9,adult 05/05/2015  . Chronic rhinitis 05/05/2015  . Coronary artery disease involving native coronary artery of native heart with angina pectoris (HCC) 05/05/2015  . Elevated PSA 05/05/2015  . GERD (gastroesophageal reflux disease) 05/05/2015  . High risk medication use 05/05/2015  . Lumbar degenerative disc disease 05/05/2015  . Malaise and fatigue 05/05/2015  . Prediabetes 05/05/2015  . Primary osteoarthritis involving multiple joints 05/05/2015  . Typical atrial flutter (HCC) 05/05/2015  . Ventral hernia without obstruction or gangrene 05/05/2015  . Sick sinus syndrome (HCC) 02/17/2015  . Essential hypertension 02/14/2015  . Hyperlipidemia 02/14/2015  . Preoperative cardiovascular examination 02/14/2015  . Presence of cardiac pacemaker 02/14/2015  . RBBB with  left anterior fascicular block 02/14/2015   Current Outpatient Medications on File Prior to Visit  Medication Sig Dispense Refill  . Multiple Vitamin (MULTI-VITAMINS) TABS Take 1 tablet by mouth daily.    . Omega-3 1000 MG CAPS Take 2 g by mouth daily.    Marland Kitchen omeprazole (PRILOSEC) 20 MG capsule Take 1 capsule by mouth every other day.     . oxyCODONE-acetaminophen (PERCOCET/ROXICET) 5-325 MG tablet Take 1 tablet by mouth every 8 (eight) hours as needed for severe pain.    Marland Kitchen timolol (TIMOPTIC) 0.25 % ophthalmic solution Place 1 drop into the right eye every morning.     No current facility-administered medications on file prior to visit.   Allergies  Allergen Reactions  . Amiodarone Nausea And Vomiting    vomiting   . Gabapentin     dizziness    . Metoprolol Other (See Comments)    Drop in heart rate, dizziness.    . Statins     myalgias  . Doxycycline     Unknown to patient   . Neomycin     Unknown to patient    . Niacin     Unknown to patient   . Ramipril     Unknown to patient      Recent Results (from the past 2160 hour(s))  CUP PACEART REMOTE DEVICE CHECK     Status: None   Collection Time: 03/03/20  8:11 AM  Result Value Ref Range   Pulse Generator Manufacturer BITR    Date Time Interrogation Session [phone]    Pulse Gen Model Rudene Christians Gen Serial Number [id number]    Clinic Name [facility]    Implantable Pulse Generator Type Implantable Pulse Generator  Implantable Pulse Generator Implant Date [id number]    Implantable Lead Manufacturer BITR    Implantable Lead Model 350 974 Setrox S S 53    Implantable Lead Serial Number [id number]    Implantable Lead Implant Date [id number]    Implantable Lead Location Detail 1 UNKNOWN    Implantable Lead Location [id number]    Implantable Lead Manufacturer SJCR    Implantable Lead Model 1888TC Tendril ST Optim    Implantable Lead Serial Number [id number]    Implantable Lead Implant Date [id number]    Implantable  Lead Location Detail 1 UNKNOWN    Implantable Lead Location [id number]   WOUND CULTURE     Status: None   Collection Time: 04/30/20 11:18 AM   Specimen: Foot, Right; Wound   Wound Culture and sens  Result Value Ref Range   Gram Stain Result Final report    Organism ID, Bacteria Comment     Comment: No white blood cells seen.   Organism ID, Bacteria Comment     Comment: Moderate number of gram positive cocci.   Aerobic Bacterial Culture Final report    Organism ID, Bacteria Mixed skin flora     Comment: Heavy growth    Objective: There were no vitals filed for this visit.  General: Patient is awake, alert, oriented x 3 and in no acute distress.  Dermatology: Skin is warm and dry bilateral with a partial thickness ulceration present  Right medial foot at TN joint, Ulceration measures 0.3cm x 0.3cm x 0.2 cm. There is a Minimally keratotic border with a granular base. The ulceration does not  probe to bone. There is no malodor, no active drainage, +blanchable erythema, trace edema. + maceration at 4th webspace on right that is slowly improving.   Vascular: Dorsalis Pedis pulse = 1/4 Bilateral,  Posterior Tibial pulse = 0/4 Bilateral,  Capillary Fill Time < 5 seconds  Neurologic: Protective sensation diminished bilateral .   Musculosketal: + Pes planus deformity.  No Pain with palpation to ulcerated area. No pain with compression to calves bilateral. No gross bony deformities noted bilateral.   No results for input(s): GRAMSTAIN, LABORGA in the last 8760 hours.  Assessment and Plan:  Problem List Items Addressed This Visit   None   Visit Diagnoses    Foot ulcer, right, limited to breakdown of skin (HCC)    -  Primary   Relevant Orders   DG Foot Complete Right   Skin maceration       PVD (peripheral vascular disease) (HCC)          -Examined patient and discussed the progression of the wound and treatment alternatives. -X-rays reviewed with no acute osseous findings noted there  is chronic arthritis and significant foot deformity secondary to pes planus - Excisionally dedbrided ulceration at right medial foot to healthy bleeding borders removing nonviable tissue using a sterile chisel blade. Wound measures post debridement as above. Wound was debrided to the level of the dermis with viable wound base exposed to promote healing. Hemostasis was achieved with manuel pressure. Patient tolerated procedure well without any discomfort or anesthesia necessary for this wound debridement.  -Applied Prisma and dry sterile dressing and instructed patient to continue with daily dressings at home consisting of same daily -Continue with Betadine in between toes and to dry well at area of maceration at right fourth and fifth toes - Advised patient to go to the ER or return to office if the wound worsens or if constitutional symptoms are present. -  Patient to return to office in 2 weeks for follow-up evaluation.  If wound still is slowly healing may consider ordering vascular studies at next visit.  Asencion Islam, DPM

## 2020-05-20 DIAGNOSIS — L97511 Non-pressure chronic ulcer of other part of right foot limited to breakdown of skin: Secondary | ICD-10-CM

## 2020-05-20 HISTORY — DX: Non-pressure chronic ulcer of other part of right foot limited to breakdown of skin: L97.511

## 2020-05-28 ENCOUNTER — Other Ambulatory Visit: Payer: Self-pay

## 2020-05-28 ENCOUNTER — Ambulatory Visit: Payer: Medicare Other | Admitting: Sports Medicine

## 2020-05-28 ENCOUNTER — Encounter: Payer: Self-pay | Admitting: Sports Medicine

## 2020-05-28 DIAGNOSIS — I739 Peripheral vascular disease, unspecified: Secondary | ICD-10-CM

## 2020-05-28 DIAGNOSIS — L97511 Non-pressure chronic ulcer of other part of right foot limited to breakdown of skin: Secondary | ICD-10-CM | POA: Diagnosis not present

## 2020-05-28 DIAGNOSIS — L988 Other specified disorders of the skin and subcutaneous tissue: Secondary | ICD-10-CM | POA: Diagnosis not present

## 2020-05-28 NOTE — Progress Notes (Signed)
Subjective: [name] is a 87 y.o. male patient seen in office for follow-up evaluation of ulceration of the right foot.  Patient reports that he has a lot of pain in his leg at night and states that he changed from using the Prisma sponge to using Polysporin that he likes better.  Patient reports that his wounds is doing okay but has a lot of sharp pains at night.  No other pedal complaints noted.  Patient Active Problem List   Diagnosis Date Noted  . Benign paroxysmal vertigo of both ears 07/28/2019  . Impacted cerumen, bilateral 04/24/2018  . PAF (paroxysmal atrial fibrillation) (HCC) 12/31/2017  . Thrombocytopenia due to drugs 07/16/2017  . Partial small bowel obstruction (HCC) 03/05/2017  . Recurrent incisional hernia 03/05/2017  . History of small bowel obstruction 03/05/2017  . Dizziness 10/05/2016  . Abnormal myocardial perfusion study 09/15/2016  . History of cardiac radiofrequency ablation 09/15/2016  . Hx of CABG 09/15/2016  . Old MI (myocardial infarction) 09/15/2016  . Aortic atherosclerosis (HCC) 02/16/2016  . Anticoagulant long-term use 05/05/2015  . BMI 31.0-31.9,adult 05/05/2015  . Chronic rhinitis 05/05/2015  . Coronary artery disease involving native coronary artery of native heart with angina pectoris (HCC) 05/05/2015  . Elevated PSA 05/05/2015  . GERD (gastroesophageal reflux disease) 05/05/2015  . High risk medication use 05/05/2015  . Lumbar degenerative disc disease 05/05/2015  . Malaise and fatigue 05/05/2015  . Prediabetes 05/05/2015  . Primary osteoarthritis involving multiple joints 05/05/2015  . Typical atrial flutter (HCC) 05/05/2015  . Ventral hernia without obstruction or gangrene 05/05/2015  . Sick sinus syndrome (HCC) 02/17/2015  . Essential hypertension 02/14/2015  . Hyperlipidemia 02/14/2015  . Preoperative cardiovascular examination 02/14/2015  . Presence of cardiac pacemaker 02/14/2015  . RBBB with left anterior fascicular block 02/14/2015    Current Outpatient Medications on File Prior to Visit  Medication Sig Dispense Refill  . Multiple Vitamin (MULTI-VITAMINS) TABS Take 1 tablet by mouth daily.    . Omega-3 1000 MG CAPS Take 2 g by mouth daily.    Marland Kitchen omeprazole (PRILOSEC) 20 MG capsule Take 1 capsule by mouth every other day.     . oxyCODONE-acetaminophen (PERCOCET/ROXICET) 5-325 MG tablet Take 1 tablet by mouth every 8 (eight) hours as needed for severe pain.    Marland Kitchen timolol (TIMOPTIC) 0.25 % ophthalmic solution Place 1 drop into the right eye every morning.     No current facility-administered medications on file prior to visit.   Allergies  Allergen Reactions  . Amiodarone Nausea And Vomiting    vomiting   . Gabapentin     dizziness    . Metoprolol Other (See Comments)    Drop in heart rate, dizziness.    . Statins     myalgias  . Doxycycline     Unknown to patient   . Neomycin     Unknown to patient    . Niacin     Unknown to patient   . Ramipril     Unknown to patient      Recent Results (from the past 2160 hour(s))  CUP PACEART REMOTE DEVICE CHECK     Status: None   Collection Time: 03/03/20  8:11 AM  Result Value Ref Range   Pulse Generator Manufacturer BITR    Date Time Interrogation Session [phone]    Pulse Gen Model Rudene Christians Gen Serial Number [id number]    Clinic Name [facility]    Implantable Pulse Generator Type  Implantable Pulse Generator    Implantable Pulse Generator Implant Date [id number]    Implantable Lead Manufacturer BITR    Implantable Lead Model 350 974 Setrox S S 53    Implantable Lead Serial Number [id number]    Implantable Lead Implant Date [id number]    Implantable Lead Location Detail 1 UNKNOWN    Implantable Lead Location [id number]    Implantable Lead Manufacturer SJCR    Implantable Lead Model 1888TC Tendril ST Optim    Implantable Lead Serial Number [id number]    Implantable Lead Implant Date [id number]    Implantable Lead Location Detail 1 UNKNOWN     Implantable Lead Location [id number]   WOUND CULTURE     Status: None   Collection Time: 04/30/20 11:18 AM   Specimen: Foot, Right; Wound   Wound Culture and sens  Result Value Ref Range   Gram Stain Result Final report    Organism ID, Bacteria Comment     Comment: No white blood cells seen.   Organism ID, Bacteria Comment     Comment: Moderate number of gram positive cocci.   Aerobic Bacterial Culture Final report    Organism ID, Bacteria Mixed skin flora     Comment: Heavy growth    Objective: There were no vitals filed for this visit.  General: Patient is awake, alert, oriented x 3 and in no acute distress.  Dermatology: Skin is warm and dry bilateral with a partial thickness ulceration present  Right medial foot at TN joint, Ulceration measures 0.2cm x 0.2cm x 0.2 cm. There is a Minimally keratotic border with a granular base. The ulceration does not probe to bone. There is no malodor, no active drainage, +blanchable erythema, trace edema. + maceration at 4th webspace on right that is slowly improving.   Vascular: Dorsalis Pedis pulse = 1/4 Bilateral,  Posterior Tibial pulse = 0/4 Bilateral,  Capillary Fill Time < 5 seconds  Neurologic: Protective sensation diminished bilateral .   Musculosketal: + Pes planus deformity.  No Pain with palpation to ulcerated area. No pain with compression to calves bilateral. No gross bony deformities noted bilateral.   No results for input(s): GRAMSTAIN, LABORGA in the last 8760 hours.  Assessment and Plan:  Problem List Items Addressed This Visit   None   Visit Diagnoses    Foot ulcer, right, limited to breakdown of skin (HCC)    -  Primary   Skin maceration       PVD (peripheral vascular disease) (HCC)          -Examined patient and discussed the progression of the wound and treatment alternatives. -Patient declined referral to vascular at this time -Plans ulceration at right medial foot and ankle.  -Applied triple antibiotic and dry  sterile dressing and instructed patient to continue with daily dressings at home consisting of same daily of Polysporin since he reports that this helps him better compared to the Prisma I applied last visit -Continue with Betadine in between toes and to dry well at area of maceration at right fourth and fifth toes - Advised patient to go to the ER or return to office if the wound worsens or if constitutional symptoms are present. -Advised patient to try over-the-counter Nervive to see if this will help with any nerve related pain and advised patient that likely this could also still be superimposed vascular pain so if this pain is not better with this supplement then I would recommend for him to strongly get the vascular studies -Patient  to return to office in 4 weeks for follow-up evaluation.  Asencion Islam, DPM

## 2020-05-28 NOTE — Patient Instructions (Signed)
Nervive over the counter nerve supplement for pain at night

## 2020-06-02 ENCOUNTER — Ambulatory Visit (INDEPENDENT_AMBULATORY_CARE_PROVIDER_SITE_OTHER): Payer: Medicare Other

## 2020-06-02 DIAGNOSIS — I495 Sick sinus syndrome: Secondary | ICD-10-CM

## 2020-06-02 LAB — CUP PACEART REMOTE DEVICE CHECK
Battery Remaining Percentage: 45 %
Brady Statistic RA Percent Paced: 95 %
Brady Statistic RV Percent Paced: 34 %
Date Time Interrogation Session: 20220323115930
Implantable Lead Implant Date: 20140813
Implantable Lead Implant Date: 20140813
Implantable Lead Location: 753859
Implantable Lead Location: 753860
Implantable Lead Model: 350
Implantable Lead Serial Number: 1111
Implantable Lead Serial Number: 2222
Implantable Pulse Generator Implant Date: 20140813
Lead Channel Impedance Value: 390 Ohm
Lead Channel Impedance Value: 566 Ohm
Lead Channel Pacing Threshold Amplitude: 0.8 V
Lead Channel Pacing Threshold Amplitude: 0.8 V
Lead Channel Pacing Threshold Pulse Width: 0.4 ms
Lead Channel Pacing Threshold Pulse Width: 0.4 ms
Lead Channel Sensing Intrinsic Amplitude: 0.7 mV
Lead Channel Sensing Intrinsic Amplitude: 2.9 mV
Lead Channel Setting Pacing Amplitude: 24 V
Pulse Gen Serial Number: 68050901

## 2020-06-10 NOTE — Progress Notes (Signed)
Remote pacemaker transmission.   

## 2020-08-05 ENCOUNTER — Ambulatory Visit (INDEPENDENT_AMBULATORY_CARE_PROVIDER_SITE_OTHER): Payer: Medicare Other | Admitting: Podiatry

## 2020-08-05 ENCOUNTER — Encounter: Payer: Self-pay | Admitting: Podiatry

## 2020-08-05 ENCOUNTER — Other Ambulatory Visit: Payer: Self-pay

## 2020-08-05 DIAGNOSIS — L84 Corns and callosities: Secondary | ICD-10-CM | POA: Diagnosis not present

## 2020-08-05 DIAGNOSIS — M79674 Pain in right toe(s): Secondary | ICD-10-CM | POA: Diagnosis not present

## 2020-08-05 DIAGNOSIS — B351 Tinea unguium: Secondary | ICD-10-CM | POA: Diagnosis not present

## 2020-08-05 DIAGNOSIS — M79675 Pain in left toe(s): Secondary | ICD-10-CM | POA: Diagnosis not present

## 2020-08-05 DIAGNOSIS — I739 Peripheral vascular disease, unspecified: Secondary | ICD-10-CM | POA: Diagnosis not present

## 2020-08-05 NOTE — Patient Instructions (Signed)
DRESSING CHANGES R 3rd toe:   PHARMACY SHOPPING LIST: 1. Neosporin Cream 2. Fabric Band-Aida   1. KEEP R 3rd toe DRY AT ALL TIMES!!!!  2. CLEANSE ULCER WITH SALINE OR WOUND CLEANSER.  3. DAB DRY WITH GAUZE SPONGE.  4. APPLY A LIGHT AMOUNT OF Neosporin TO RIGHT 3RD DIGIT.  5. APPLY Fabric Band-Aid.  6. DO NOT WALK BAREFOOT!!!  7.  IF YOU EXPERIENCE ANY FEVER, CHILLS, NIGHTSWEATS, NAUSEA OR VOMITING, ELEVATED OR LOW BLOOD SUGARS, REPORT TO EMERGENCY ROOM.  8. IF YOU EXPERIENCE INCREASED REDNESS, PAIN, SWELLING, DISCOLORATION, ODOR, PUS, DRAINAGE OR WARMTH OF YOUR FOOT, REPORT TO EMERGENCY ROOM.

## 2020-08-09 ENCOUNTER — Encounter: Payer: Self-pay | Admitting: Podiatry

## 2020-08-09 NOTE — Progress Notes (Signed)
  Subjective:  Patient ID: [name], male    DOB: 04/01/1933,  MRN: [id number]  [name] presents to clinic today for follow up ulceration right foot. He relates area has healed and he has had no problems. Today, he c/o painful 3rd digit right foot. He denies any episodes of trauma, but states digit is tender to touch. Denies any drainage or swelling of digit.   Allergies  Allergen Reactions  . Amiodarone Nausea And Vomiting    vomiting   . Gabapentin     dizziness    . Metoprolol Other (See Comments)    Drop in heart rate, dizziness.    . Statins     myalgias  . Doxycycline     Unknown to patient   . Neomycin     Unknown to patient    . Niacin     Unknown to patient   . Ramipril     Unknown to patient     Review of Systems: Negative except as noted in the HPI. Objective:   Constitutional [name] is a pleasant 87 y.o. Caucasian male, in NAD. AAO x 3.   Vascular Capillary refill time to digits <5 seconds b/l lower extremities. Faintly palpable DP pulse(s) b/l lower extremities. Nonpalpable PT pulse(s) b/l lower extremities. Pedal hair absent. Lower extremity skin temperature gradient within normal limits. No pain with calf compression b/l. No cyanosis or clubbing noted.  Neurologic Normal speech. Oriented to person, place, and time. Protective sensation diminished with 10g monofilament b/l.  Dermatologic Pedal skin with normal turgor, texture and tone bilaterally. No open wounds bilaterally. No interdigital macerations bilaterally. Toenails 1-5 b/l elongated, discolored, dystrophic, thickened, crumbly with subungual debris and tenderness to dorsal palpation. Partial thickness lesion noted distal tip right 3rd digit. It is hyperkeratotic with subdermal hemorrhage and measures 0.2 x 0.3 cm.. No erythema, no edema, no drainage, no fluctuance. Wound of right TN joint remains closed.  Orthopedic: Normal muscle strength 5/5 to all lower extremity muscle groups  bilaterally. No pain crepitus or joint limitation noted with ROM b/l. Hammertoe(s) noted to the 2-5 bilaterally.   Radiographs: None Assessment:   1. Pain due to onychomycosis of toenails of both feet   2. Pre-ulcerative corn or callous   3. PAD (peripheral artery disease) (HCC)    Plan:  Patient was evaluated and treated and all questions answered.  Onychomycosis with pain -Nails palliatively debridement as below -Educated on self-care  Procedure: Nail Debridement Rationale: Pain Type of Debridement: manual, sharp debridement. Instrumentation: Nail nipper, rotary burr. Number of Nails: 10 -Examined patient. -Patient to continue soft, supportive shoe gear daily. -Toenails 1-5 b/l were debrided in length and girth with sterile nail nippers and dremel without iatrogenic bleeding.  -Preulcerative lesion pared R 3rd toe. Cleansed with alcohol and TAO/fabric band-aid applied. Dispensed toe crest for daily use to keep digit straight. Apply every morning, remove every evening. Dressing instructions dispensed. Patient noted relief after treatment on today's visit. -Patient to report any pedal injuries to medical professional immediately. -Patient/POA to call should there be question/concern in the interim.  Return in about 2 weeks (around 08/19/2020).  [name], DPM

## 2020-08-12 ENCOUNTER — Ambulatory Visit: Payer: Medicare Other | Admitting: Podiatry

## 2020-08-19 ENCOUNTER — Other Ambulatory Visit: Payer: Self-pay

## 2020-08-19 ENCOUNTER — Ambulatory Visit: Payer: Medicare Other | Admitting: Podiatry

## 2020-08-19 ENCOUNTER — Encounter: Payer: Self-pay | Admitting: Podiatry

## 2020-08-19 DIAGNOSIS — L97511 Non-pressure chronic ulcer of other part of right foot limited to breakdown of skin: Secondary | ICD-10-CM | POA: Diagnosis not present

## 2020-09-01 ENCOUNTER — Ambulatory Visit (INDEPENDENT_AMBULATORY_CARE_PROVIDER_SITE_OTHER): Payer: Medicare Other

## 2020-09-01 DIAGNOSIS — I495 Sick sinus syndrome: Secondary | ICD-10-CM

## 2020-09-01 LAB — CUP PACEART REMOTE DEVICE CHECK
Date Time Interrogation Session: 20220621073155
Implantable Lead Implant Date: 20140813
Implantable Lead Implant Date: 20140813
Implantable Lead Location: 753859
Implantable Lead Location: 753860
Implantable Lead Model: 350
Implantable Lead Serial Number: 1111
Implantable Lead Serial Number: 2222
Implantable Pulse Generator Implant Date: 20140813
Pulse Gen Serial Number: 68050901

## 2020-09-09 NOTE — Progress Notes (Signed)
  Subjective:  Patient ID: [name], male    DOB: 12-Jul-1933,  MRN: [id number]  Chief Complaint  Patient presents with   Foot Ulcer    The spot on the right ankle area is scabbing over and there is not any draining    87 y.o. male presents for wound care. Hx confirmed with patient.  Objective:  Physical Exam: Wound Location: right 3rd toe Wound Measurement: pinpoint Wound Base: granular Peri-wound: Normal Exudate: None: wound tissue dry wound without warmth, erythema, signs of acute infection  Right TN joint wound remains healed. Assessment:   1. Foot ulcer, right, limited to breakdown of skin [facility])    Plan:  Patient was evaluated and treated and all questions answered.  Ulcer right 3rd toe -Appears essentially resolved. -Dressed with silvadene and band-aid -F/u should issues persist.   No follow-ups on file.

## 2020-09-21 NOTE — Progress Notes (Signed)
Remote pacemaker transmission.   

## 2020-09-23 ENCOUNTER — Ambulatory Visit: Payer: Medicare Other | Admitting: Podiatry

## 2020-11-22 ENCOUNTER — Ambulatory Visit: Payer: Medicare Other | Admitting: Podiatry

## 2020-12-01 ENCOUNTER — Ambulatory Visit (INDEPENDENT_AMBULATORY_CARE_PROVIDER_SITE_OTHER): Payer: Medicare Other

## 2020-12-01 DIAGNOSIS — I495 Sick sinus syndrome: Secondary | ICD-10-CM

## 2020-12-01 LAB — CUP PACEART REMOTE DEVICE CHECK
Date Time Interrogation Session: 20220921083655
Implantable Lead Implant Date: 20140813
Implantable Lead Implant Date: 20140813
Implantable Lead Location: 753859
Implantable Lead Location: 753860
Implantable Lead Model: 350
Implantable Lead Serial Number: 1111
Implantable Lead Serial Number: 2222
Implantable Pulse Generator Implant Date: 20140813
Pulse Gen Serial Number: 68050901

## 2020-12-07 NOTE — Progress Notes (Signed)
Remote pacemaker transmission.   

## 2020-12-13 ENCOUNTER — Ambulatory Visit: Payer: Medicare Other | Admitting: Podiatry

## 2020-12-13 ENCOUNTER — Other Ambulatory Visit: Payer: Self-pay

## 2020-12-13 DIAGNOSIS — B351 Tinea unguium: Secondary | ICD-10-CM | POA: Diagnosis not present

## 2020-12-13 DIAGNOSIS — I739 Peripheral vascular disease, unspecified: Secondary | ICD-10-CM

## 2020-12-13 DIAGNOSIS — M79675 Pain in left toe(s): Secondary | ICD-10-CM

## 2020-12-13 DIAGNOSIS — M79674 Pain in right toe(s): Secondary | ICD-10-CM | POA: Diagnosis not present

## 2020-12-20 NOTE — Progress Notes (Signed)
  Subjective:  Patient ID: [name], male    DOB: 11-Dec-1933,  MRN: [id number]  Chief Complaint  Patient presents with   debride    RFC    87 y.o. male presents with the above complaint. History confirmed with patient. Denies DM. States that the nails are thickened and cause pain. She has had full healing of her previous ulcer without recurrence.  Objective:  Physical Exam: warm, good capillary refill, nail exam onychomycosis of the toenails, no trophic changes or ulcerative lesions. DP pulses palpable, PT pulses palpable, and protective sensation absent No images are attached to the encounter.  Assessment:   1. Pain due to onychomycosis of toenails of both feet   2. PAD (peripheral artery disease) (HCC)    Plan:  Patient was evaluated and treated and all questions answered.  Onychomycosis -Nails palliatively debrided secondary to pain  Procedure: Nail Debridement Type of Debridement: manual, sharp debridement. Instrumentation: Nail nipper, rotary burr. Number of Nails: 10  No follow-ups on file.

## 2020-12-27 ENCOUNTER — Other Ambulatory Visit: Payer: Self-pay

## 2020-12-27 ENCOUNTER — Encounter: Payer: Self-pay | Admitting: Cardiology

## 2020-12-27 ENCOUNTER — Ambulatory Visit (INDEPENDENT_AMBULATORY_CARE_PROVIDER_SITE_OTHER): Payer: Medicare Other | Admitting: Cardiology

## 2020-12-27 VITALS — BP 154/80 | HR 69 | Ht 69.0 in | Wt 192.2 lb

## 2020-12-27 DIAGNOSIS — I495 Sick sinus syndrome: Secondary | ICD-10-CM

## 2020-12-27 NOTE — Progress Notes (Signed)
Electrophysiology Office Note   Date:  12/27/2020   ID:  [name], DOB Apr 07, 1933, MRN [id number]  PCP:  Billy Clark, NP  Cardiologist:  Dulce Sellar Primary Electrophysiologist:  Regan Lemming, MD    No chief complaint on file.    History of Present Illness: [name] is a 87 y.o. male who is being seen today for the evaluation of VT at the request of Norman Herrlich. Presenting today for electrophysiology evaluation.    He has a history significant for coronary artery disease, ischemic cardiomyopathy with an ejection fraction of 25%, CABG in 2006, atrial flutter status post ablation in 2014, VT, and Biotronik pacemaker implanted for sick sinus syndrome.    Today, denies symptoms of palpitations, chest pain, shortness of breath, orthopnea, PND, lower extremity edema, claudication,  presyncope, syncope, bleeding, or neurologic sequela. The patient is tolerating medications without difficulties.  He continues to have episodic lightheadedness.  He feels well when he sits down.  He also has sciatic nerve issues that do not bother him when he is sitting but do bother him when he is standing.  He states that he has lightheaded every time he stands up and it takes a while for it to improve.   Past Medical History:  Diagnosis Date   Abnormal myocardial perfusion study 09/15/2016   Anticoagulant long-term use 05/05/2015   Aortic atherosclerosis (HCC) 02/16/2016   BMI 31.0-31.9,adult 05/05/2015   CAD in native artery 02/14/2015   Chronic rhinitis 05/05/2015   Coronary artery abnormality 02/14/2015   Coronary artery disease involving coronary bypass graft of native heart without angina pectoris 05/05/2015   Coronary artery disease involving native coronary artery of native heart with angina pectoris (HCC) 05/05/2015   Dizziness 10/05/2016   Elevated PSA 05/05/2015   [name]   Essential hypertension 02/14/2015   GERD (gastroesophageal reflux disease) 05/05/2015   High risk  medication use 05/05/2015   History of cardiac radiofrequency ablation 09/15/2016   History of small bowel obstruction 03/05/2017   Hx of CABG 09/15/2016   Hyperlipidemia 02/14/2015   Impacted cerumen, bilateral 04/24/2018   Lumbar degenerative disc disease 05/05/2015   Malaise and fatigue 05/05/2015   Last Assessment & Plan:  Relevant Hx: Course: Daily Update: Today's Plan:he admits that this can be prominent for him and he had UA checked to ensure not having infection which he was concerned about and reassured him of this, encouraged him to remain active and his meds contribute to this as well which he is aware of   Electronically signed by: Billy Clark, NP 08/12/15 1303   Old MI (myocardial infarction) 09/15/2016   PAF (paroxysmal atrial fibrillation) (HCC) 12/31/2017   Telemetry detected   Partial small bowel obstruction (HCC) 03/05/2017   Prediabetes 05/05/2015   Preoperative cardiovascular examination 02/14/2015   Presence of cardiac pacemaker 02/14/2015   For symptomatic sinus bradycardia after flutter ablation For symptomatic sinus bradycardia after flutter ablation   Primary osteoarthritis involving multiple joints 05/05/2015   Last Assessment & Plan:  Relevant Hx: Course: Daily Update: Today's Plan:he has known OA to his joints and he has seen chiropractor in the past for this and he states that he was told he had degenerative changes to his back several times. The knee needs to be xrayed to the right with the swelling , he is taking the oxycodone only as needed but it works well he does not feel dizzy whereas the aleve   RBBB with left anterior fascicular block  02/14/2015   Recurrent incisional hernia 03/05/2017   Sick sinus syndrome (HCC) 02/17/2015   SSS (sick sinus syndrome) (HCC) 02/17/2015   Thrombocytopenia due to drugs 07/16/2017   Typical atrial flutter (HCC) 05/05/2015   Last Assessment & Plan:  Warfarin was stopped September 07, 2016 by physicians assistant or [place] cardiology  after consistent monitoring showed no recurrence of A. fib for more than 1-1/2 years   Ventral hernia without obstruction or gangrene 05/05/2015   Past Surgical History:  Procedure Laterality Date   ATRIAL FLUTTER ABLATION     COLON SURGERY     CORONARY ARTERY BYPASS GRAFT     INSERT / REPLACE / REMOVE PACEMAKER       Current Outpatient Medications  Medication Sig Dispense Refill   meclizine (ANTIVERT) 12.5 MG tablet Take 12.5 mg by mouth 2 (two) times daily as needed.     Multiple Vitamin (MULTI-VITAMINS) TABS Take 1 tablet by mouth daily.     Omega-3 1000 MG CAPS Take 2 g by mouth daily.     omeprazole (PRILOSEC) 20 MG capsule Take 1 capsule by mouth every other day.      oxyCODONE-acetaminophen (PERCOCET/ROXICET) 5-325 MG tablet Take 1 tablet by mouth every 8 (eight) hours as needed for severe pain.     timolol (TIMOPTIC) 0.25 % ophthalmic solution Place 1 drop into the right eye every morning.     No current facility-administered medications for this visit.    Allergies:   Amiodarone, Gabapentin, Metoprolol, Statins, Doxycycline, Neomycin, Niacin, and Ramipril   Social History:  The patient  reports that he has never smoked. He has never used smokeless tobacco. He reports that he does not drink alcohol and does not use drugs.   Family History:  The patient's family history includes AAA (abdominal aortic aneurysm) in his daughter; Diabetes in his daughter and sister; Hyperlipidemia in his daughter; Hypertension in his daughter and daughter; Migraines in his daughter; Rheum arthritis in his daughter.   ROS:  Please see the history of present illness.   Otherwise, review of systems is positive for none.   All other systems are reviewed and negative.   PHYSICAL EXAM: VS:  BP (!) 154/80   Pulse 69   Ht 5\' 9"  (1.753 m)   Wt 192 lb 3.2 oz (87.2 kg)   SpO2 96%   BMI 28.38 kg/m  , BMI Body mass index is 28.38 kg/m. GEN: Well nourished, well developed, in no acute distress  HEENT:  normal  Neck: no JVD, carotid bruits, or masses Cardiac: RRR; no murmurs, rubs, or gallops,no edema  Respiratory:  clear to auscultation bilaterally, normal work of breathing GI: soft, nontender, nondistended, + BS MS: no deformity or atrophy  Skin: warm and dry, device site well healed Neuro:  Strength and sensation are intact Psych: euthymic mood, full affect  EKG:  EKG is ordered today. Personal review of the ekg ordered shows atrial paced, right bundle branch block, left anterior fascicular block, lateral Q waves  Personal review of the device interrogation today. Results in [facility]   Recent Labs: No results found for requested labs within last 8760 hours.    Lipid Panel     Component Value Date/Time   CHOL 151 07/08/2019 0932   TRIG 109 07/08/2019 0932   HDL 43 07/08/2019 0932   CHOLHDL 3.5 07/08/2019 0932   LDLCALC 88 07/08/2019 0932     Wt Readings from Last 3 Encounters:  12/27/20 192 lb 3.2 oz (87.2 kg)  07/08/19 194 lb 6.4 oz (88.2 kg)  12/31/18 189 lb (85.7 kg)      Other studies Reviewed: Additional studies/ records that were reviewed today include: epic notes    ASSESSMENT AND PLAN:  1.  Coronary artery disease with chronic stable angina: No current chest pain.  Is status post three-vessel CABG.  Continue plan per primary cardiology.  2.  Hypertension: Elevated today.  Currently not on any blood pressure medications.  He is having dizziness when standing, which appears to be orthostatic in nature.  We Jewell Ryans hold off on further therapy for his blood pressure.  We Sai Moura arrange for him to follow-up with his primary cardiologist in 3 months.  Blood pressure issues can be addressed at that point if dizziness and lightheadedness improves.  3.  Sick sinus syndrome: Status post Biotronik dual-chamber pacemaker.  Device functioning appropriately.  No changes at this time.  4.  Typical atrial flutter: Currently not anticoagulated.  Status post ablation without  recurrence.   Current medicines are reviewed at length with the patient today.   The patient does not have concerns regarding his medicines.  The following changes were made today: none  Labs/ tests ordered today include:  Orders Placed This Encounter  Procedures   EKG 12-Lead     Disposition:   FU with Salinda Snedeker 12 months  Signed, Tanveer Brammer Jorja Loa, MD  12/27/2020 4:55 PM     [facility] [address] [place] [address] [phone] (office) [phone] (fax)

## 2020-12-27 NOTE — Patient Instructions (Signed)
Medication Instructions:  Your physician recommends that you continue on your current medications as directed. Please refer to the Current Medication list given to you today.  *If you need a refill on your cardiac medications before your next appointment, please call your pharmacy*   Lab Work: None ordered   Testing/Procedures: None ordered   Follow-Up: At [facility], you and your health needs are our priority.  As part of our continuing mission to provide you with exceptional heart care, we have created designated Provider Care Teams.  These Care Teams include your primary Cardiologist (physician) and Advanced Practice Providers (APPs -  Physician Assistants and Nurse Practitioners) who all work together to provide you with the care you need, when you need it.  We recommend signing up for the patient portal called "MyChart".  Sign up information is provided on this After Visit Summary.  MyChart is used to connect with patients for Virtual Visits (Telemedicine).  Patients are able to view lab/test results, encounter notes, upcoming appointments, etc.  Non-urgent messages can be sent to your provider as well.   To learn more about what you can do with MyChart, go to [email or web address].    Your physician recommends that you schedule a follow-up appointment in: 3 months with Dr. Dulce Sellar   Your next appointment:   1 year(s)  The format for your next appointment:   In Person  Provider:   Loman Brooklyn, MD    Thank you for choosing [facility]!!   Dory Horn, RN [phone]

## 2021-01-18 DIAGNOSIS — L03114 Cellulitis of left upper limb: Secondary | ICD-10-CM | POA: Insufficient documentation

## 2021-01-18 HISTORY — DX: Cellulitis of left upper limb: L03.114

## 2021-02-02 ENCOUNTER — Encounter: Payer: Medicare Other | Admitting: Cardiology

## 2021-03-02 ENCOUNTER — Ambulatory Visit (INDEPENDENT_AMBULATORY_CARE_PROVIDER_SITE_OTHER): Payer: Medicare Other

## 2021-03-02 DIAGNOSIS — I495 Sick sinus syndrome: Secondary | ICD-10-CM | POA: Diagnosis not present

## 2021-03-02 LAB — CUP PACEART REMOTE DEVICE CHECK
Date Time Interrogation Session: 20221221075748
Implantable Lead Implant Date: 20140813
Implantable Lead Implant Date: 20140813
Implantable Lead Location: 753859
Implantable Lead Location: 753860
Implantable Lead Model: 350
Implantable Lead Serial Number: 1111
Implantable Lead Serial Number: 2222
Implantable Pulse Generator Implant Date: 20140813
Pulse Gen Serial Number: 68050901

## 2021-03-10 NOTE — Progress Notes (Signed)
Remote pacemaker transmission.   

## 2021-03-17 ENCOUNTER — Ambulatory Visit: Payer: Medicare Other | Admitting: Podiatry

## 2021-03-17 ENCOUNTER — Encounter: Payer: Self-pay | Admitting: Podiatry

## 2021-03-17 ENCOUNTER — Other Ambulatory Visit: Payer: Self-pay

## 2021-03-17 DIAGNOSIS — L97511 Non-pressure chronic ulcer of other part of right foot limited to breakdown of skin: Secondary | ICD-10-CM

## 2021-03-17 DIAGNOSIS — I70223 Atherosclerosis of native arteries of extremities with rest pain, bilateral legs: Secondary | ICD-10-CM | POA: Diagnosis not present

## 2021-03-17 DIAGNOSIS — I739 Peripheral vascular disease, unspecified: Secondary | ICD-10-CM | POA: Diagnosis not present

## 2021-03-17 DIAGNOSIS — M79675 Pain in left toe(s): Secondary | ICD-10-CM

## 2021-03-17 DIAGNOSIS — M79674 Pain in right toe(s): Secondary | ICD-10-CM

## 2021-03-17 DIAGNOSIS — B351 Tinea unguium: Secondary | ICD-10-CM | POA: Diagnosis not present

## 2021-03-21 ENCOUNTER — Other Ambulatory Visit: Payer: Self-pay

## 2021-03-21 DIAGNOSIS — I739 Peripheral vascular disease, unspecified: Secondary | ICD-10-CM

## 2021-03-21 NOTE — Progress Notes (Signed)
°  Subjective:  Patient ID: [name], male    DOB: 1934-01-04,  MRN: [id number]  Chief Complaint  Patient presents with   Nail Problem    Trim nails     88 y.o. male presents with the above complaint. History confirmed with patient. Requests care of her nails today. Reports callus to the right big toe, unknown duration. Denies treatment. PCP Dr. Bea Graff last seen 3 to 4 weeks ago.  Objective:  Physical Exam: cool, delayed good capillary refill, nail exam onychomycosis of the toenails, no trophic changes or ulcerative lesions. protective sensation absent. Pedal pulses non-palpable. Right great toe superficial ulcer noted no warmth/erythema/SOI.  Assessment:   1. PAD (peripheral artery disease) ([place])   2. Critical limb ischemia of both lower extremities ([place])   3. Ulcer of great toe, right, limited to breakdown of skin [facility])    Plan:  Patient was evaluated and treated and all questions answered.  Onychomycosis -Nails debrided x10  Procedure: Nail Debridement Type of Debridement: manual, sharp debridement. Instrumentation: Nail nipper, rotary burr. Number of Nails: 10  Toe Ulcer, PAD with concern for CLI -Dispensed surgical shoe to prevent pressure formation -Dressed ulcer with SSD and band-aid. Continue daily. -Will refer to vascular for urgent eval  Return in about 2 weeks (around 03/31/2021) for PAD f/u.

## 2021-03-22 ENCOUNTER — Other Ambulatory Visit: Payer: Self-pay

## 2021-03-22 ENCOUNTER — Ambulatory Visit (HOSPITAL_COMMUNITY)
Admission: RE | Admit: 2021-03-22 | Discharge: 2021-03-22 | Disposition: A | Discharge status: Home / Self Care | Payer: Medicare Other | Source: Ambulatory Visit | Attending: Vascular Surgery | Admitting: Vascular Surgery

## 2021-03-22 DIAGNOSIS — I739 Peripheral vascular disease, unspecified: Secondary | ICD-10-CM

## 2021-03-23 NOTE — Progress Notes (Deleted)
Office Note     CC:  Right great toe superficial ulcer  Requesting Provider:  Bess Harvest*  HPI: KHUP MERRIWEATHER is a 88 y.o. (January 15, 1934) male who presents at the request of Dr. Hardie Pulley, D.P.M. after recent evaluation in which she appreciated right great toe ulceration.  ***  The pt is *** on a statin for cholesterol management.  The pt is *** on a daily aspirin.   Other AC:  *** The pt is *** on medication for hypertension.   The pt is *** diabetic.  Tobacco hx:  ***  Past Medical History:  Diagnosis Date   Abnormal myocardial perfusion study 09/15/2016   Anticoagulant long-term use 05/05/2015   Aortic atherosclerosis ([place]) 02/16/2016   BMI 31.0-31.9,adult 05/05/2015   CAD in native artery 02/14/2015   Chronic rhinitis 05/05/2015   Coronary artery abnormality 02/14/2015   Coronary artery disease involving coronary bypass graft of native heart without angina pectoris 05/05/2015   Coronary artery disease involving native coronary artery of native heart with angina pectoris ([place]) 05/05/2015   Dizziness 10/05/2016   Elevated PSA 05/05/2015   [name]   Essential hypertension 02/14/2015   GERD (gastroesophageal reflux disease) 05/05/2015   High risk medication use 05/05/2015   History of cardiac radiofrequency ablation 09/15/2016   History of small bowel obstruction 03/05/2017   Hx of CABG 09/15/2016   Hyperlipidemia 02/14/2015   Impacted cerumen, bilateral 04/24/2018   Lumbar degenerative disc disease 05/05/2015   Malaise and fatigue 05/05/2015   Last Assessment & Plan:  Relevant Hx: Course: Daily Update: Today's Plan:he admits that this can be prominent for him and he had UA checked to ensure not having infection which he was concerned about and reassured him of this, encouraged him to remain active and his meds contribute to this as well which he is aware of   Electronically signed by: Mayer Camel, NP 08/12/15 1303   Old MI (myocardial infarction) 09/15/2016   PAF  (paroxysmal atrial fibrillation) ([place]) 12/31/2017   Telemetry detected   Partial small bowel obstruction ([place]) 03/05/2017   Prediabetes 05/05/2015   Preoperative cardiovascular examination 02/14/2015   Presence of cardiac pacemaker 02/14/2015   For symptomatic sinus bradycardia after flutter ablation For symptomatic sinus bradycardia after flutter ablation   Primary osteoarthritis involving multiple joints 05/05/2015   Last Assessment & Plan:  Relevant Hx: Course: Daily Update: Today's Plan:he has known OA to his joints and he has seen chiropractor in the past for this and he states that he was told he had degenerative changes to his back several times. The knee needs to be xrayed to the right with the swelling , he is taking the oxycodone only as needed but it works well he does not feel dizzy whereas the aleve   RBBB with left anterior fascicular block 02/14/2015   Recurrent incisional hernia 03/05/2017   Sick sinus syndrome ([place]) 02/17/2015   SSS (sick sinus syndrome) ([place]) 02/17/2015   Thrombocytopenia due to drugs 07/16/2017   Typical atrial flutter ([place]) 05/05/2015   Last Assessment & Plan:  Warfarin was stopped September 07, 2016 by physicians assistant or [place] cardiology after consistent monitoring showed no recurrence of A. fib for more than 1-1/2 years   Ventral hernia without obstruction or gangrene 05/05/2015    Past Surgical History:  Procedure Laterality Date   ATRIAL FLUTTER ABLATION     COLON SURGERY     CORONARY ARTERY BYPASS GRAFT     INSERT / REPLACE /  REMOVE PACEMAKER      Social History   Socioeconomic History   Marital status: Divorced    Spouse name: Not on file   Number of children: Not on file   Years of education: Not on file   Highest education level: Not on file  Occupational History   Not on file  Tobacco Use   Smoking status: Never   Smokeless tobacco: Never  Vaping Use   Vaping Use: Never used  Substance and Sexual Activity   Alcohol use: Never   Drug  use: Never   Sexual activity: Not on file  Other Topics Concern   Not on file  Social History Narrative   Not on file   Social Determinants of Health   Financial Resource Strain: Not on file  Food Insecurity: Not on file  Transportation Needs: Not on file  Physical Activity: Not on file  Stress: Not on file  Social Connections: Not on file  Intimate Partner Violence: Not on file   *** Family History  Problem Relation Age of Onset   Diabetes Sister    Hyperlipidemia Daughter    Hypertension Daughter    Diabetes Daughter    Rheum arthritis Daughter    Hypertension Daughter    AAA (abdominal aortic aneurysm) Daughter    Migraines Daughter    Heart attack Neg Hx    Heart disease Neg Hx    Heart failure Neg Hx     Current Outpatient Medications  Medication Sig Dispense Refill   cephALEXin (KEFLEX) 500 MG capsule Take 500 mg by mouth 2 (two) times daily.     meclizine (ANTIVERT) 12.5 MG tablet Take 12.5 mg by mouth 2 (two) times daily as needed.     Multiple Vitamin (MULTI-VITAMINS) TABS Take 1 tablet by mouth daily.     Omega-3 1000 MG CAPS Take 2 g by mouth daily.     omeprazole (PRILOSEC) 20 MG capsule Take 1 capsule by mouth every other day.      oxyCODONE (OXY IR/ROXICODONE) 5 MG immediate release tablet SMARTSIG:5 Milligram(s) By Mouth Every 6 Hours PRN     oxyCODONE-acetaminophen (PERCOCET/ROXICET) 5-325 MG tablet Take 1 tablet by mouth every 8 (eight) hours as needed for severe pain.     sulfamethoxazole-trimethoprim (BACTRIM DS) 800-160 MG tablet Take 1 tablet by mouth 2 (two) times daily.     timolol (TIMOPTIC) 0.25 % ophthalmic solution Place 1 drop into the right eye every morning.     No current facility-administered medications for this visit.    Allergies  Allergen Reactions   Amiodarone Nausea And Vomiting    vomiting    Gabapentin     dizziness     Metoprolol Other (See Comments)    Drop in heart rate, dizziness.     Statins     myalgias    Doxycycline     Unknown to patient    Neomycin     Unknown to patient     Niacin     Unknown to patient    Ramipril     Unknown to patient       REVIEW OF SYSTEMS:  *** [X]  denotes positive finding, [ ]  denotes negative finding Cardiac  Comments:  Chest pain or chest pressure:    Shortness of breath upon exertion:    Short of breath when lying flat:    Irregular heart rhythm:        Vascular    Pain in calf, thigh, or hip brought on  by ambulation:    Pain in feet at night that wakes you up from your sleep:     Blood clot in your veins:    Leg swelling:         Pulmonary    Oxygen at home:    Productive cough:     Wheezing:         Neurologic    Sudden weakness in arms or legs:     Sudden numbness in arms or legs:     Sudden onset of difficulty speaking or slurred speech:    Temporary loss of vision in one eye:     Problems with dizziness:         Gastrointestinal    Blood in stool:     Vomited blood:         Genitourinary    Burning when urinating:     Blood in urine:        Psychiatric    Major depression:         Hematologic    Bleeding problems:    Problems with blood clotting too easily:        Skin    Rashes or ulcers:        Constitutional    Fever or chills:      PHYSICAL EXAMINATION:  There were no vitals filed for this visit.  General:  WDWN in NAD; vital signs documented above Gait: Not observed HENT: WNL, normocephalic Pulmonary: normal non-labored breathing , without wheezing Cardiac: {Desc; regular/irreg:14544} HR, bruit*** Abdomen: soft, NT, no masses Skin: {With/Without:20273} rashes Vascular Exam/Pulses:  Right Left  Radial {Exam; arterial pulse strength 0-4:30167} {Exam; arterial pulse strength 0-4:30167}  Ulnar {Exam; arterial pulse strength 0-4:30167} {Exam; arterial pulse strength 0-4:30167}  Femoral {Exam; arterial pulse strength 0-4:30167} {Exam; arterial pulse strength 0-4:30167}  Popliteal {Exam; arterial pulse  strength 0-4:30167} {Exam; arterial pulse strength 0-4:30167}  DP {Exam; arterial pulse strength 0-4:30167} {Exam; arterial pulse strength 0-4:30167}  PT {Exam; arterial pulse strength 0-4:30167} {Exam; arterial pulse strength 0-4:30167}   Extremities: {With/Without:20273} ischemic changes, {With/Without:20273} Gangrene , {With/Without:20273} cellulitis; {With/Without:20273} open wounds;  Musculoskeletal: no muscle wasting or atrophy  Neurologic: A&O X 3;  No focal weakness or paresthesias are detected Psychiatric:  The pt has {Desc; normal/abnormal:11317::"Normal"} affect.   Non-Invasive Vascular Imaging:   ABI Findings:  +---------+------------------+-----+-------------------+--------+   Right     Rt Pressure (mmHg) Index Waveform            Comment    +---------+------------------+-----+-------------------+--------+   Brachial  162                                                     +---------+------------------+-----+-------------------+--------+   PTA       48                 0.29  dampened monophasic            +---------+------------------+-----+-------------------+--------+   DP                                 absent                         +---------+------------------+-----+-------------------+--------+   Great Toe  Absent                         +---------+------------------+-----+-------------------+--------+   +---------+------------------+-----+---------+-------+   Left      Lt Pressure (mmHg) Index Waveform  Comment   +---------+------------------+-----+---------+-------+   Brachial  166                                          +---------+------------------+-----+---------+-------+   PTA       162                0.98  triphasic           +---------+------------------+-----+---------+-------+   DP        148                0.89  biphasic            +---------+------------------+-----+---------+-------+   Great Toe 37                 0.22                       +---------+------------------+-----+---------+-------+   +-------+-----------+-----------+------------+------------+   ABI/TBI Today's ABI Today's TBI Previous ABI Previous TBI   +-------+-----------+-----------+------------+------------+   Right   0.29        0.00                                    +-------+-----------+-----------+------------+------------+   Left    0.98        0.22                                    +-------+-----------+-----------+------------+------------+     ASSESSMENT/PLAN: ALVEN MCBETH is a 88 y.o. male presenting with right great toe ulceration.  Patient has nonpalpable pulses bilaterally and ABI in the right leg demonstrating severe peripheral arterial disease.  Patient's peripheral arterial disease is defined as [place] 5 critical limb ischemia.  Andreas would benefit from right lower extremity angiogram in an effort to define, and hopefully improve right lower extremity perfusion for wound healing.   Broadus John, MD [facility] [phone]

## 2021-03-25 ENCOUNTER — Encounter: Payer: Medicare Other | Admitting: Vascular Surgery

## 2021-03-26 NOTE — Progress Notes (Deleted)
Cardiology Office Note:    Date:  03/26/2021   ID:  [name], DOB 1933-04-05, MRN [id number]  PCP:  Billy Payment., MD  Cardiologist:  Billy Herrlich, MD    Referring MD: [name]*    ASSESSMENT:    No diagnosis found. PLAN:    In order of problems listed above:  ***   Next appointment: ***   Medication Adjustments/Labs and Tests Ordered: Current medicines are reviewed at length with the patient today.  Concerns regarding medicines are outlined above.  No orders of the defined types were placed in this encounter.  No orders of the defined types were placed in this encounter.   No chief complaint on file.   History of Present Illness:    [name] is a 88 y.o. male with a hx of CAD EF 45% CABG 2006 atrial flutter EP catheter ablation 2014 right bundle branch block ventricular tachycardia and pacemaker last seen 07/08/2019. Compliance with diet, lifestyle and medications: ***  His last pacemaker device download showed normal battery and lead parameters. Lower extremity ABI 03/22/2021 showed critical limb ischemia right lower extremity with arterial ulceration normal in the left Past Medical History:  Diagnosis Date   Abnormal myocardial perfusion study 09/15/2016   Anticoagulant long-term use 05/05/2015   Aortic atherosclerosis (HCC) 02/16/2016   BMI 31.0-31.9,adult 05/05/2015   CAD in native artery 02/14/2015   Chronic rhinitis 05/05/2015   Coronary artery abnormality 02/14/2015   Coronary artery disease involving coronary bypass graft of native heart without angina pectoris 05/05/2015   Coronary artery disease involving native coronary artery of native heart with angina pectoris (HCC) 05/05/2015   Dizziness 10/05/2016   Elevated PSA 05/05/2015   [name]   Essential hypertension 02/14/2015   GERD (gastroesophageal reflux disease) 05/05/2015   High risk medication use 05/05/2015   History of cardiac radiofrequency ablation 09/15/2016   History of small bowel  obstruction 03/05/2017   Hx of CABG 09/15/2016   Hyperlipidemia 02/14/2015   Impacted cerumen, bilateral 04/24/2018   Lumbar degenerative disc disease 05/05/2015   Malaise and fatigue 05/05/2015   Last Assessment & Plan:  Relevant Hx: Course: Daily Update: Today's Plan:he admits that this can be prominent for him and he had UA checked to ensure not having infection which he was concerned about and reassured him of this, encouraged him to remain active and his meds contribute to this as well which he is aware of   Electronically signed by: Billy Clark, NP 08/12/15 1303   Old MI (myocardial infarction) 09/15/2016   PAF (paroxysmal atrial fibrillation) (HCC) 12/31/2017   Telemetry detected   Partial small bowel obstruction (HCC) 03/05/2017   Prediabetes 05/05/2015   Preoperative cardiovascular examination 02/14/2015   Presence of cardiac pacemaker 02/14/2015   For symptomatic sinus bradycardia after flutter ablation For symptomatic sinus bradycardia after flutter ablation   Primary osteoarthritis involving multiple joints 05/05/2015   Last Assessment & Plan:  Relevant Hx: Course: Daily Update: Today's Plan:he has known OA to his joints and he has seen chiropractor in the past for this and he states that he was told he had degenerative changes to his back several times. The knee needs to be xrayed to the right with the swelling , he is taking the oxycodone only as needed but it works well he does not feel dizzy whereas the aleve   RBBB with left anterior fascicular block 02/14/2015   Recurrent incisional hernia 03/05/2017   Sick sinus syndrome (HCC)  02/17/2015   SSS (sick sinus syndrome) (HCC) 02/17/2015   Thrombocytopenia due to drugs 07/16/2017   Typical atrial flutter (HCC) 05/05/2015   Last Assessment & Plan:  Warfarin was stopped September 07, 2016 by physicians assistant or [place] [facility] after consistent monitoring showed no recurrence of A. fib for more than 1-1/2 years   Ventral hernia  without obstruction or gangrene 05/05/2015    Past Surgical History:  Procedure Laterality Date   ATRIAL FLUTTER ABLATION     COLON SURGERY     CORONARY ARTERY BYPASS GRAFT     INSERT / REPLACE / REMOVE PACEMAKER      Current Medications: No outpatient medications have been marked as taking for the 03/29/21 encounter (Appointment) with Baldo Daub, MD.     Allergies:   Amiodarone, Gabapentin, Metoprolol, Statins, Doxycycline, Neomycin, Niacin, and Ramipril   Social History   Socioeconomic History   Marital status: Divorced    Spouse name: Not on file   Number of children: Not on file   Years of education: Not on file   Highest education level: Not on file  Occupational History   Not on file  Tobacco Use   Smoking status: Never   Smokeless tobacco: Never  Vaping Use   Vaping Use: Never used  Substance and Sexual Activity   Alcohol use: Never   Drug use: Never   Sexual activity: Not on file  Other Topics Concern   Not on file  Social History Narrative   Not on file   Social Determinants of Health   Financial Resource Strain: Not on file  Food Insecurity: Not on file  Transportation Needs: Not on file  Physical Activity: Not on file  Stress: Not on file  Social Connections: Not on file     Family History: The patient's ***family history includes AAA (abdominal aortic aneurysm) in his daughter; Diabetes in his daughter and sister; Hyperlipidemia in his daughter; Hypertension in his daughter and daughter; Migraines in his daughter; Rheum arthritis in his daughter. There is no history of Heart attack, Heart disease, or Heart failure. ROS:   Please see the history of present illness.    All other systems reviewed and are negative.  EKGs/Labs/Other Studies Reviewed:    The following studies were reviewed today:  EKG:  EKG ordered today and personally reviewed.  The ekg ordered today demonstrates ***  Recent Labs: No results found for requested labs within  last 8760 hours.  Recent Lipid Panel    Component Value Date/Time   CHOL 151 07/08/2019 0932   TRIG 109 07/08/2019 0932   HDL 43 07/08/2019 0932   CHOLHDL 3.5 07/08/2019 0932   LDLCALC 88 07/08/2019 0932    Physical Exam:    VS:  There were no vitals taken for this visit.    Wt Readings from Last 3 Encounters:  12/27/20 192 lb 3.2 oz (87.2 kg)  07/08/19 194 lb 6.4 oz (88.2 kg)  12/31/18 189 lb (85.7 kg)     GEN: *** Well nourished, well developed in no acute distress HEENT: Normal NECK: No JVD; No carotid bruits LYMPHATICS: No lymphadenopathy CARDIAC: ***RRR, no murmurs, rubs, gallops RESPIRATORY:  Clear to auscultation without rales, wheezing or rhonchi  ABDOMEN: Soft, non-tender, non-distended MUSCULOSKELETAL:  No edema; No deformity  SKIN: Warm and dry NEUROLOGIC:  Alert and oriented x 3 PSYCHIATRIC:  Normal affect    Signed, Billy Herrlich, MD  03/26/2021 4:49 PM     [facility]

## 2021-03-29 ENCOUNTER — Ambulatory Visit: Payer: Medicare Other | Admitting: Cardiology

## 2021-03-31 ENCOUNTER — Inpatient Hospital Stay (HOSPITAL_COMMUNITY)
Admission: EM | Admit: 2021-03-31 | Discharge: 2021-04-16 | DRG: 982 | Disposition: A | Discharge status: Skilled Nursing Facility | Payer: Medicare Other | Attending: Family Medicine | Admitting: Family Medicine

## 2021-03-31 ENCOUNTER — Observation Stay (HOSPITAL_COMMUNITY): Payer: Medicare Other

## 2021-03-31 ENCOUNTER — Other Ambulatory Visit: Payer: Self-pay

## 2021-03-31 ENCOUNTER — Ambulatory Visit: Payer: Medicare Other | Admitting: Podiatry

## 2021-03-31 ENCOUNTER — Encounter (HOSPITAL_COMMUNITY): Payer: Self-pay

## 2021-03-31 ENCOUNTER — Emergency Department (HOSPITAL_COMMUNITY): Payer: Medicare Other

## 2021-03-31 ENCOUNTER — Encounter: Payer: Self-pay | Admitting: Podiatry

## 2021-03-31 VITALS — Temp 97.4°F

## 2021-03-31 DIAGNOSIS — L03115 Cellulitis of right lower limb: Secondary | ICD-10-CM | POA: Diagnosis present

## 2021-03-31 DIAGNOSIS — R21 Rash and other nonspecific skin eruption: Secondary | ICD-10-CM

## 2021-03-31 DIAGNOSIS — I480 Paroxysmal atrial fibrillation: Secondary | ICD-10-CM | POA: Diagnosis present

## 2021-03-31 DIAGNOSIS — L03119 Cellulitis of unspecified part of limb: Secondary | ICD-10-CM

## 2021-03-31 DIAGNOSIS — Z751 Person awaiting admission to adequate facility elsewhere: Secondary | ICD-10-CM

## 2021-03-31 DIAGNOSIS — I70221 Atherosclerosis of native arteries of extremities with rest pain, right leg: Secondary | ICD-10-CM

## 2021-03-31 DIAGNOSIS — L270 Generalized skin eruption due to drugs and medicaments taken internally: Secondary | ICD-10-CM | POA: Diagnosis present

## 2021-03-31 DIAGNOSIS — L03116 Cellulitis of left lower limb: Principal | ICD-10-CM | POA: Diagnosis present

## 2021-03-31 DIAGNOSIS — Z9861 Coronary angioplasty status: Secondary | ICD-10-CM

## 2021-03-31 DIAGNOSIS — H3530 Unspecified macular degeneration: Secondary | ICD-10-CM | POA: Diagnosis present

## 2021-03-31 DIAGNOSIS — I70223 Atherosclerosis of native arteries of extremities with rest pain, bilateral legs: Secondary | ICD-10-CM

## 2021-03-31 DIAGNOSIS — S91101A Unspecified open wound of right great toe without damage to nail, initial encounter: Secondary | ICD-10-CM

## 2021-03-31 DIAGNOSIS — R739 Hyperglycemia, unspecified: Secondary | ICD-10-CM | POA: Diagnosis present

## 2021-03-31 DIAGNOSIS — I495 Sick sinus syndrome: Secondary | ICD-10-CM

## 2021-03-31 DIAGNOSIS — I472 Ventricular tachycardia, unspecified: Secondary | ICD-10-CM | POA: Diagnosis present

## 2021-03-31 DIAGNOSIS — I5042 Chronic combined systolic (congestive) and diastolic (congestive) heart failure: Secondary | ICD-10-CM

## 2021-03-31 DIAGNOSIS — I2510 Atherosclerotic heart disease of native coronary artery without angina pectoris: Secondary | ICD-10-CM

## 2021-03-31 DIAGNOSIS — Z951 Presence of aortocoronary bypass graft: Secondary | ICD-10-CM

## 2021-03-31 DIAGNOSIS — I4891 Unspecified atrial fibrillation: Secondary | ICD-10-CM

## 2021-03-31 DIAGNOSIS — Z950 Presence of cardiac pacemaker: Secondary | ICD-10-CM

## 2021-03-31 DIAGNOSIS — I452 Bifascicular block: Secondary | ICD-10-CM | POA: Diagnosis present

## 2021-03-31 DIAGNOSIS — L02619 Cutaneous abscess of unspecified foot: Secondary | ICD-10-CM | POA: Diagnosis present

## 2021-03-31 DIAGNOSIS — R931 Abnormal findings on diagnostic imaging of heart and coronary circulation: Secondary | ICD-10-CM

## 2021-03-31 DIAGNOSIS — I10 Essential (primary) hypertension: Secondary | ICD-10-CM | POA: Diagnosis present

## 2021-03-31 DIAGNOSIS — I4892 Unspecified atrial flutter: Secondary | ICD-10-CM | POA: Diagnosis present

## 2021-03-31 DIAGNOSIS — R9439 Abnormal result of other cardiovascular function study: Secondary | ICD-10-CM

## 2021-03-31 DIAGNOSIS — I739 Peripheral vascular disease, unspecified: Secondary | ICD-10-CM | POA: Diagnosis not present

## 2021-03-31 DIAGNOSIS — K219 Gastro-esophageal reflux disease without esophagitis: Secondary | ICD-10-CM | POA: Diagnosis present

## 2021-03-31 DIAGNOSIS — L97511 Non-pressure chronic ulcer of other part of right foot limited to breakdown of skin: Secondary | ICD-10-CM | POA: Diagnosis not present

## 2021-03-31 DIAGNOSIS — L02611 Cutaneous abscess of right foot: Secondary | ICD-10-CM | POA: Diagnosis present

## 2021-03-31 DIAGNOSIS — I70291 Other atherosclerosis of native arteries of extremities, right leg: Secondary | ICD-10-CM

## 2021-03-31 DIAGNOSIS — M159 Polyosteoarthritis, unspecified: Secondary | ICD-10-CM | POA: Diagnosis present

## 2021-03-31 DIAGNOSIS — I110 Hypertensive heart disease with heart failure: Secondary | ICD-10-CM | POA: Diagnosis present

## 2021-03-31 DIAGNOSIS — Z8249 Family history of ischemic heart disease and other diseases of the circulatory system: Secondary | ICD-10-CM

## 2021-03-31 DIAGNOSIS — L02612 Cutaneous abscess of left foot: Secondary | ICD-10-CM | POA: Diagnosis present

## 2021-03-31 DIAGNOSIS — G629 Polyneuropathy, unspecified: Secondary | ICD-10-CM | POA: Diagnosis present

## 2021-03-31 DIAGNOSIS — R3911 Hesitancy of micturition: Secondary | ICD-10-CM | POA: Diagnosis present

## 2021-03-31 DIAGNOSIS — L97519 Non-pressure chronic ulcer of other part of right foot with unspecified severity: Secondary | ICD-10-CM | POA: Diagnosis present

## 2021-03-31 DIAGNOSIS — Z833 Family history of diabetes mellitus: Secondary | ICD-10-CM

## 2021-03-31 DIAGNOSIS — I5022 Chronic systolic (congestive) heart failure: Secondary | ICD-10-CM

## 2021-03-31 DIAGNOSIS — Z79899 Other long term (current) drug therapy: Secondary | ICD-10-CM

## 2021-03-31 DIAGNOSIS — R7303 Prediabetes: Secondary | ICD-10-CM | POA: Diagnosis present

## 2021-03-31 DIAGNOSIS — T3695XA Adverse effect of unspecified systemic antibiotic, initial encounter: Secondary | ICD-10-CM | POA: Diagnosis present

## 2021-03-31 DIAGNOSIS — Z20822 Contact with and (suspected) exposure to covid-19: Secondary | ICD-10-CM | POA: Diagnosis present

## 2021-03-31 DIAGNOSIS — M79671 Pain in right foot: Secondary | ICD-10-CM

## 2021-03-31 DIAGNOSIS — I255 Ischemic cardiomyopathy: Secondary | ICD-10-CM | POA: Diagnosis present

## 2021-03-31 DIAGNOSIS — E785 Hyperlipidemia, unspecified: Secondary | ICD-10-CM | POA: Diagnosis present

## 2021-03-31 DIAGNOSIS — I25810 Atherosclerosis of coronary artery bypass graft(s) without angina pectoris: Secondary | ICD-10-CM | POA: Diagnosis present

## 2021-03-31 DIAGNOSIS — R42 Dizziness and giddiness: Secondary | ICD-10-CM

## 2021-03-31 DIAGNOSIS — I252 Old myocardial infarction: Secondary | ICD-10-CM

## 2021-03-31 HISTORY — DX: Unspecified macular degeneration: H35.30

## 2021-03-31 HISTORY — DX: Cellulitis of unspecified part of limb: L03.119

## 2021-03-31 HISTORY — DX: Ischemic cardiomyopathy: I25.5

## 2021-03-31 HISTORY — DX: Peripheral vascular disease, unspecified: I73.9

## 2021-03-31 HISTORY — DX: Chronic combined systolic (congestive) and diastolic (congestive) heart failure: I50.42

## 2021-03-31 LAB — CBC WITH DIFFERENTIAL/PLATELET
Abs Immature Granulocytes: 0.02 10*3/uL (ref 0.00–0.07)
Basophils Absolute: 0 10*3/uL (ref 0.0–0.1)
Basophils Relative: 0 %
Eosinophils Absolute: 0 10*3/uL (ref 0.0–0.5)
Eosinophils Relative: 0 %
HCT: 43.9 % (ref 39.0–52.0)
Hemoglobin: 14.7 g/dL (ref 13.0–17.0)
Immature Granulocytes: 0 %
Lymphocytes Relative: 10 %
Lymphs Abs: 0.9 10*3/uL (ref 0.7–4.0)
MCH: 30.4 pg (ref 26.0–34.0)
MCHC: 33.5 g/dL (ref 30.0–36.0)
MCV: 90.9 fL (ref 80.0–100.0)
Monocytes Absolute: 0.4 10*3/uL (ref 0.1–1.0)
Monocytes Relative: 5 %
Neutro Abs: 7 10*3/uL (ref 1.7–7.7)
Neutrophils Relative %: 85 %
Platelets: 177 10*3/uL (ref 150–400)
RBC: 4.83 MIL/uL (ref 4.22–5.81)
RDW: 11.6 % (ref 11.5–15.5)
WBC: 8.3 10*3/uL (ref 4.0–10.5)
nRBC: 0 % (ref 0.0–0.2)

## 2021-03-31 LAB — BASIC METABOLIC PANEL
Anion gap: 13 (ref 5–15)
BUN: 18 mg/dL (ref 8–23)
CO2: 25 mmol/L (ref 22–32)
Calcium: 9.3 mg/dL (ref 8.9–10.3)
Chloride: 103 mmol/L (ref 98–111)
Creatinine, Ser: 0.81 mg/dL (ref 0.61–1.24)
GFR, Estimated: >60 mL/min (ref >60)
Glucose, Bld: 137 mg/dL — ABNORMAL HIGH (ref 70–99)
Potassium: 3.8 mmol/L (ref 3.5–5.1)
Sodium: 141 mmol/L (ref 135–145)

## 2021-03-31 LAB — RESP PANEL BY RT-PCR (FLU A&B, COVID) ARPGX2
Influenza A by PCR: NEGATIVE
Influenza B by PCR: NEGATIVE
SARS Coronavirus 2 by RT PCR: NEGATIVE

## 2021-03-31 LAB — LACTIC ACID, PLASMA: Lactic Acid, Venous: 1.5 mmol/L (ref 0.5–1.9)

## 2021-03-31 MED ORDER — VANCOMYCIN HCL 1750 MG/350ML IV SOLN
1750.0000 mg | INTRAVENOUS | Status: DC
  Administered 2021-04-01: 1750 mg via INTRAVENOUS
  Filled 2021-03-31 (×3): qty 350

## 2021-03-31 MED ORDER — BISACODYL 5 MG PO TBEC
5.0000 mg | DELAYED_RELEASE_TABLET | Freq: Every day | ORAL | Status: DC | PRN
  Filled 2021-03-31: qty 1

## 2021-03-31 MED ORDER — PIPERACILLIN-TAZOBACTAM 3.375 G IVPB
3.3750 g | Freq: Three times a day (TID) | INTRAVENOUS | Status: DC
  Administered 2021-03-31 – 2021-04-07 (×20): 3.375 g via INTRAVENOUS
  Filled 2021-03-31 (×20): qty 50

## 2021-03-31 MED ORDER — VANCOMYCIN HCL 1750 MG/350ML IV SOLN
1750.0000 mg | Freq: Once | INTRAVENOUS | Status: DC
  Filled 2021-03-31: qty 350

## 2021-03-31 MED ORDER — VANCOMYCIN HCL IN DEXTROSE 1-5 GM/200ML-% IV SOLN: 1000.0000 mg | Freq: Once | INTRAVENOUS | Status: DC

## 2021-03-31 MED ORDER — VANCOMYCIN HCL 2000 MG/400ML IV SOLN
2000.0000 mg | Freq: Once | INTRAVENOUS | Status: AC
  Administered 2021-03-31: 2000 mg via INTRAVENOUS
  Filled 2021-03-31: qty 400

## 2021-03-31 MED ORDER — PIPERACILLIN-TAZOBACTAM 3.375 G IVPB 30 MIN: 3.3750 g | Freq: Once | INTRAVENOUS | Status: DC

## 2021-03-31 MED ORDER — IOHEXOL 300 MG/ML  SOLN
75.0000 mL | Freq: Once | INTRAMUSCULAR | Status: AC | PRN
  Administered 2021-03-31: 75 mL via INTRAVENOUS

## 2021-03-31 MED ORDER — PIPERACILLIN-TAZOBACTAM 3.375 G IVPB 30 MIN
3.3750 g | Freq: Once | INTRAVENOUS | Status: AC
  Administered 2021-03-31: 3.375 g via INTRAVENOUS
  Filled 2021-03-31: qty 50

## 2021-03-31 MED ORDER — TIMOLOL MALEATE 0.25 % OP SOLN: 1.0000 [drp] | Freq: Every morning | OPHTHALMIC | Status: DC

## 2021-03-31 MED ORDER — CLINDAMYCIN PHOSPHATE 600 MG/50ML IV SOLN
600.0000 mg | Freq: Three times a day (TID) | INTRAVENOUS | Status: DC
  Administered 2021-03-31: 600 mg via INTRAVENOUS
  Filled 2021-03-31 (×3): qty 50

## 2021-03-31 MED ORDER — ENOXAPARIN SODIUM 40 MG/0.4ML IJ SOSY
40.0000 mg | PREFILLED_SYRINGE | INTRAMUSCULAR | Status: DC
  Administered 2021-03-31 – 2021-04-10 (×11): 40 mg via SUBCUTANEOUS
  Filled 2021-03-31 (×11): qty 0.4

## 2021-03-31 MED ORDER — OXYCODONE HCL 5 MG PO TABS
5.0000 mg | ORAL_TABLET | ORAL | Status: DC | PRN
  Administered 2021-03-31 – 2021-04-03 (×5): 5 mg via ORAL
  Filled 2021-03-31 (×6): qty 1

## 2021-03-31 NOTE — ED Notes (Signed)
One set of blood cultures sent (left forearm) with IV start

## 2021-03-31 NOTE — Assessment & Plan Note (Addendum)
-  Last seen cardiology at [facility], Dr. Sampson Goon, in 2018.  It was mentioned that the EF at that time was 40 to 45%, and recently had a stress test which was negative.  2D echo done here shows EF 40-45% with mildly decreased LV function.  Suspect unchanged.  He has no heart failure symptoms, no angina.  Currently he is not on goal-directed therapy, not clear when he stopped the medications but back in 2018 he was on metoprolol 12.5 twice daily as well as aspirin.  Discussed with the daughter, he has self discontinued the medication as he just does not like to take pills.  Currently appears stable.  Vascular surgery request cardiology evaluation for restratification pre-bypass surgery.  -LHC for monday

## 2021-03-31 NOTE — Assessment & Plan Note (Addendum)
-  vascular consulted.  Arteriogram as above

## 2021-03-31 NOTE — Progress Notes (Addendum)
Pharmacy Antibiotic Note  [name] is a 88 y.o. male admitted on 03/31/2021 with cellulitis.  Pharmacy has been consulted for vancomycin and Zosyn dosing.  WBC remain within normal limits at 8.3, patient is currently afebrile. Renal function appears to be at baseline and is okay.   Plan: Vancomycin IV 2000 mg x1 Start Vancomycin IV 1750mg  q24h (estimated AUC 465) Zosyn 3.375g q 8h Monitor renal function and signs of clinical improvement Follow-up cultures  Height: 5\' 10"  (177.8 cm) Weight: 83.9 kg (185 lb) IBW/kg (Calculated) : 73  Temp (24hrs), Avg:98 F (36.7 C), Min:97.4 F (36.3 C), Max:98.6 F (37 C)  Recent Labs  Lab 03/31/21 1351  WBC 8.3  CREATININE 0.81    Estimated Creatinine Clearance: 68.8 mL/min (by C-G formula based on SCr of 0.81 mg/dL).    Allergies  Allergen Reactions   Amiodarone Nausea And Vomiting    vomiting    Gabapentin     dizziness     Metoprolol Other (See Comments)    Drop in heart rate, dizziness.     Statins     myalgias   Doxycycline     Unknown to patient    Neomycin     Unknown to patient     Niacin     Unknown to patient    Ramipril     Unknown to patient      Antimicrobials this admission: 1/19 vancomycin >>  1/19 Zosyn >>   Dose adjustments this admission: none  Microbiology results: 1/19 BCx: pending  Thank you for involving pharmacy in this patient's care.  2/19, PharmD PGY1 Ambulatory Care Pharmacy Resident 03/31/2021 5:06 PM  **Pharmacist phone directory can be found on [email or web address] listed under [facility]**

## 2021-03-31 NOTE — ED Notes (Signed)
Bladder scan completed noted 449ml. Per admit provider do an in and out cath if over 312ml. In and out cath completed. When catheter removed noted blood on the tip of the catheter and blood coming out of the meatus. Admit provider paged to make aware at this time

## 2021-03-31 NOTE — H&P (View-Only) (Signed)
Hospital Consult    Reason for Consult: Right lower extremity wound with nonpalpable pulse Requesting Physician: Emergency department MRN #:  [id number]  History of Present Illness: This is a 88 y.o. male with medical history including CAD status post CABG, sick sinus syndrome with pacemaker, A. fib, not on anticoagulation, right foot wound.  On exam, Karl Balesrzie was pleasant.  He stated the foot wound had been present for a number of weeks, but over the course of the last week has become more red, indurated with worsening pain.  The pain was not present during exam.  The wound has been followed by podiatry in the outpatient setting at [facility], and was sent to the hospital due to concern for cellulitis.  He denies fevers, chills, cough, nausea, vomiting. The erythema and induration has been present for roughly 1 week.  Past Medical History:  Diagnosis Date   Abnormal myocardial perfusion study 09/15/2016   Anticoagulant long-term use 05/05/2015   Aortic atherosclerosis (HCC) 02/16/2016   BMI 31.0-31.9,adult 05/05/2015   CAD in native artery 02/14/2015   Chronic rhinitis 05/05/2015   Coronary artery abnormality 02/14/2015   Coronary artery disease involving coronary bypass graft of native heart without angina pectoris 05/05/2015   Coronary artery disease involving native coronary artery of native heart with angina pectoris (HCC) 05/05/2015   Dizziness 10/05/2016   Elevated PSA 05/05/2015   Saddie Bendershao   Essential hypertension 02/14/2015   GERD (gastroesophageal reflux disease) 05/05/2015   High risk medication use 05/05/2015   History of cardiac radiofrequency ablation 09/15/2016   History of small bowel obstruction 03/05/2017   Hx of CABG 09/15/2016   Hyperlipidemia 02/14/2015   Impacted cerumen, bilateral 04/24/2018   Lumbar degenerative disc disease 05/05/2015   Malaise and fatigue 05/05/2015   Last Assessment & Plan:  Relevant Hx: Course: Daily Update: Today's Plan:he admits that this can be prominent for him  and he had UA checked to ensure not having infection which he was concerned about and reassured him of this, encouraged him to remain active and his meds contribute to this as well which he is aware of   Electronically signed by: Krystal ClarkMelissa Joyce Bores-Patram, NP 08/12/15 1303   Old MI (myocardial infarction) 09/15/2016   PAF (paroxysmal atrial fibrillation) (HCC) 12/31/2017   Telemetry detected   Partial small bowel obstruction (HCC) 03/05/2017   Prediabetes 05/05/2015   Preoperative cardiovascular examination 02/14/2015   Presence of cardiac pacemaker 02/14/2015   For symptomatic sinus bradycardia after flutter ablation For symptomatic sinus bradycardia after flutter ablation   Primary osteoarthritis involving multiple joints 05/05/2015   Last Assessment & Plan:  Relevant Hx: Course: Daily Update: Today's Plan:he has known OA to his joints and he has seen chiropractor in the past for this and he states that he was told he had degenerative changes to his back several times. The knee needs to be xrayed to the right with the swelling , he is taking the oxycodone only as needed but it works well he does not feel dizzy whereas the aleve   RBBB with left anterior fascicular block 02/14/2015   Recurrent incisional hernia 03/05/2017   Sick sinus syndrome (HCC) 02/17/2015   SSS (sick sinus syndrome) (HCC) 02/17/2015   Thrombocytopenia due to drugs 07/16/2017   Typical atrial flutter (HCC) 05/05/2015   Last Assessment & Plan:  Warfarin was stopped September 07, 2016 by physicians assistant or [place] [facility] after consistent monitoring showed no recurrence of A. fib for more than 1-1/2 years   Ventral  hernia without obstruction or gangrene 05/05/2015    Past Surgical History:  Procedure Laterality Date   ATRIAL FLUTTER ABLATION     COLON SURGERY     CORONARY ARTERY BYPASS GRAFT     INSERT / REPLACE / REMOVE PACEMAKER      Allergies  Allergen Reactions   Amiodarone Nausea And Vomiting    vomiting     Gabapentin     dizziness     Metoprolol Other (See Comments)    Drop in heart rate, dizziness.     Statins     myalgias   Doxycycline     Unknown to patient    Neomycin     Unknown to patient     Niacin     Unknown to patient    Ramipril     Unknown to patient      Prior to Admission medications   Medication Sig Start Date End Date Taking? Authorizing Provider  acetaminophen (TYLENOL) 500 MG tablet Take 500 mg by mouth every 6 (six) hours as needed for mild pain.   Yes [provider]  hydroxypropyl methylcellulose / hypromellose (ISOPTO TEARS / GONIOVISC) 2.5 % ophthalmic solution Place 1 drop into both eyes daily as needed for dry eyes.   Yes [provider]  ibuprofen (ADVIL) 200 MG tablet Take 200 mg by mouth every 6 (six) hours as needed for mild pain.   Yes [provider]  Nutritional Supplements (PROSTATE PO) Take 1 tablet by mouth daily.   Yes [provider]    Social History   Socioeconomic History   Marital status: Divorced    Spouse name: Not on file   Number of children: Not on file   Years of education: Not on file   Highest education level: Not on file  Occupational History   Not on file  Tobacco Use   Smoking status: Never   Smokeless tobacco: Never  Vaping Use   Vaping Use: Never used  Substance and Sexual Activity   Alcohol use: Never   Drug use: Never   Sexual activity: Not on file  Other Topics Concern   Not on file  Social History Narrative   Not on file   Social Determinants of Health   Financial Resource Strain: Not on file  Food Insecurity: Not on file  Transportation Needs: Not on file  Physical Activity: Not on file  Stress: Not on file  Social Connections: Not on file  Intimate Partner Violence: Not on file     Family History  Problem Relation Age of Onset   Diabetes Sister    Hyperlipidemia Daughter    Hypertension Daughter    Diabetes Daughter    Rheum arthritis Daughter     Hypertension Daughter    AAA (abdominal aortic aneurysm) Daughter    Migraines Daughter    Heart attack Neg Hx    Heart disease Neg Hx    Heart failure Neg Hx     ROS: Otherwise negative unless mentioned in HPI  Physical Examination  Vitals:   03/31/21 1534 03/31/21 1643  BP: (!) 151/78 133/68  Pulse: 66 68  Resp: 16 16  Temp:    SpO2: 100% 99%   Body mass index is 26.54 kg/m.  General:  WDWN in NAD Gait: Not observed HENT: WNL, normocephalic Pulmonary: normal non-labored breathing Cardiac: A. fib Abdomen:  soft, NT/ND, no masses Skin: without rashes Vascular Exam/Pulses: 2+ femoral pulses bilaterally, nonpalpable pedal pulses.  Extremities: without ischemic changes,  with Gangrene , with cellulitis; with open wounds;      Musculoskeletal: no muscle wasting or atrophy  Neurologic: A&O X 3;  No focal weakness or paresthesias are detected; speech is fluent/normal Psychiatric:  The pt has Normal affect. Lymph:  Unremarkable  CBC    Component Value Date/Time   WBC 8.3 03/31/2021 1351   RBC 4.83 03/31/2021 1351   HGB 14.7 03/31/2021 1351   HCT 43.9 03/31/2021 1351   PLT 177 03/31/2021 1351   MCV 90.9 03/31/2021 1351   MCH 30.4 03/31/2021 1351   MCHC 33.5 03/31/2021 1351   RDW 11.6 03/31/2021 1351   LYMPHSABS 0.9 03/31/2021 1351   MONOABS 0.4 03/31/2021 1351   EOSABS 0.0 03/31/2021 1351   BASOSABS 0.0 03/31/2021 1351    BMET    Component Value Date/Time   NA 141 03/31/2021 1351   NA 140 07/08/2019 0932   K 3.8 03/31/2021 1351   CL 103 03/31/2021 1351   CO2 25 03/31/2021 1351   GLUCOSE 137 (H) 03/31/2021 1351   BUN 18 03/31/2021 1351   BUN 21 07/08/2019 0932   CREATININE 0.81 03/31/2021 1351   CALCIUM 9.3 03/31/2021 1351   GFRNONAA >60 03/31/2021 1351   GFRAA 89 07/08/2019 0932    COAGS: No results found for: INR, PROTIME   Non-Invasive Vascular Imaging:    ABI Findings:  +---------+------------------+-----+-------------------+--------+    Right     Rt Pressure (mmHg) Index Waveform            Comment    +---------+------------------+-----+-------------------+--------+   Brachial  162                                                     +---------+------------------+-----+-------------------+--------+   PTA       48                 0.29  dampened monophasic            +---------+------------------+-----+-------------------+--------+   DP                                 absent                         +---------+------------------+-----+-------------------+--------+   Great Toe                          Absent                         +---------+------------------+-----+-------------------+--------+   +---------+------------------+-----+---------+-------+   Left      Lt Pressure (mmHg) Index Waveform  Comment   +---------+------------------+-----+---------+-------+   Brachial  166                                          +---------+------------------+-----+---------+-------+   PTA       162                0.98  triphasic           +---------+------------------+-----+---------+-------+   DP        148  0.89  biphasic            °+---------+------------------+-----+---------+-------+  ° Great Toe 37                 0.22                      °+---------+------------------+-----+---------+-------+  ° °ASSESSMENT/PLAN: This is a 87 y.o. male with right lower extremity great toe wound that has been present for a number of weeks.  There is significant cellulitis in the foot.  Vital signs are normal with no ascending cellulitis, or lymphangitis concerning for rapid, ascending infection. ABIs were reviewed demonstrating severe peripheral arterial disease in the right leg.  Mild disease present in the left leg. ° °Zaedyn's peripheral arterial disease is classified as right lower extremity Rutherford 5 critical limb ischemia. ° °He would benefit from right lower extremity angiogram in an effort to define and hopefully improve distal  perfusion to the foot. The wound, even after debridement, will not heal without improved perfusion. Being that the patient has atrial fibrillation, and is not anticoagulated, a possible source is cardioembolic, especially with the ABI asymmetry seen between the left and right leg blood flow.  Fortunately, Jarid remains asymptomatic without rest pain, or sensory or motor deficits. ° °I have evaluated our group's schedule and will tentatively plan Monday for right lower extremity angiogram.  I will defer toe management to podiatry.  If we are unable to improve lower extremity perfusion, Nicoli will require a higher level of amputation. ° °Recommend broad-spectrum antibiotics °Plan for angiogram Monday °Please consider echocardiography to ensure no cardiac thrombus ° °I will discuss the above with his daughter. ° °J. Eli Desiraye Rolfson MD MS °[facility] °[phone] °03/31/2021  °6:02 PM ° °

## 2021-03-31 NOTE — ED Notes (Signed)
[name] daughter [phone] call with any changes or if pt gets a room

## 2021-03-31 NOTE — H&P (Addendum)
History and Physical    [name]:[id number] DOB: 1933/10/16 DOA: 03/31/2021  I have briefly reviewed the patient's prior medical records in [facility]  PCP: Gordan Payment., MD  Patient coming from: home  Chief Complaint: RLE wound  HPI: [name] is a 88 y.o. male with medical history significant of coronary artery disease, ischemic cardiomyopathy with chronic systolic CHF with prior EF of 01%, history of CABG in 2006, a flutter status post ablation in 2014, VT, also sick sinus syndrome status post pacemaker.  He has been dealing with a right lower extremity callus for several weeks, seen as an outpatient by podiatry.  Over the last 3 to 4 days, he has noticed increased pain to the right lower extremity, increased swelling and redness.  He has a degree of neuropathy and does not feel much.  He was seen in podiatrist office today, was felt to have significant worsening, cellulitis, and was sent to the emergency room.  On my evaluation he denies any chest pain, denies any shortness of breath, denies any fever or chills.  No nausea or vomiting.  He personally does not think the wound is as bad.  ED Course: His vital signs are stable, he is afebrile, normotensive and satting well on room air.  Blood work is essentially unremarkable, no WBC. Foot x-ray shows concern for soft tissue gas/infection with a gas-forming organism.  There was concern for critical limb ischemia in podiatrist office and vascular surgery saw patient in the ED but felt to have good pulses.  Podiatry also consulted, he was placed on antibiotics and we are asked to admit.  Review of Systems: All systems reviewed, and apart from HPI, all negative  Past Medical History:  Diagnosis Date   Abnormal myocardial perfusion study 09/15/2016   Anticoagulant long-term use 05/05/2015   Aortic atherosclerosis (HCC) 02/16/2016   BMI 31.0-31.9,adult 05/05/2015   CAD in native artery 02/14/2015   Chronic rhinitis 05/05/2015    Coronary artery abnormality 02/14/2015   Coronary artery disease involving coronary bypass graft of native heart without angina pectoris 05/05/2015   Coronary artery disease involving native coronary artery of native heart with angina pectoris (HCC) 05/05/2015   Dizziness 10/05/2016   Elevated PSA 05/05/2015   [name]   Essential hypertension 02/14/2015   GERD (gastroesophageal reflux disease) 05/05/2015   High risk medication use 05/05/2015   History of cardiac radiofrequency ablation 09/15/2016   History of small bowel obstruction 03/05/2017   Hx of CABG 09/15/2016   Hyperlipidemia 02/14/2015   Impacted cerumen, bilateral 04/24/2018   Lumbar degenerative disc disease 05/05/2015   Malaise and fatigue 05/05/2015   Last Assessment & Plan:  Relevant Hx: Course: Daily Update: Today's Plan:he admits that this can be prominent for him and he had UA checked to ensure not having infection which he was concerned about and reassured him of this, encouraged him to remain active and his meds contribute to this as well which he is aware of   Electronically signed by: Krystal Clark, NP 08/12/15 1303   Old MI (myocardial infarction) 09/15/2016   PAF (paroxysmal atrial fibrillation) (HCC) 12/31/2017   Telemetry detected   Partial small bowel obstruction (HCC) 03/05/2017   Prediabetes 05/05/2015   Preoperative cardiovascular examination 02/14/2015   Presence of cardiac pacemaker 02/14/2015   For symptomatic sinus bradycardia after flutter ablation For symptomatic sinus bradycardia after flutter ablation   Primary osteoarthritis involving multiple joints 05/05/2015   Last Assessment & Plan:  Relevant Hx: Course: Daily Update: Today's Plan:he has known OA to his joints and he has seen chiropractor in the past for this and he states that he was told he had degenerative changes to his back several times. The knee needs to be xrayed to the right with the swelling , he is taking the oxycodone only as needed but it works  well he does not feel dizzy whereas the aleve   RBBB with left anterior fascicular block 02/14/2015   Recurrent incisional hernia 03/05/2017   Sick sinus syndrome ([place]) 02/17/2015   SSS (sick sinus syndrome) ([place]) 02/17/2015   Thrombocytopenia due to drugs 07/16/2017   Typical atrial flutter ([place]) 05/05/2015   Last Assessment & Plan:  Warfarin was stopped September 07, 2016 by physicians assistant or [place] cardiology after consistent monitoring showed no recurrence of A. fib for more than 1-1/2 years   Ventral hernia without obstruction or gangrene 05/05/2015    Past Surgical History:  Procedure Laterality Date   ATRIAL FLUTTER ABLATION     COLON SURGERY     CORONARY ARTERY BYPASS GRAFT     INSERT / REPLACE / REMOVE PACEMAKER       reports that he has never smoked. He has never used smokeless tobacco. He reports that he does not drink alcohol and does not use drugs.  Allergies  Allergen Reactions   Amiodarone Nausea And Vomiting    vomiting    Gabapentin     dizziness     Metoprolol Other (See Comments)    Drop in heart rate, dizziness.     Statins     myalgias   Doxycycline     Unknown to patient    Neomycin     Unknown to patient     Niacin     Unknown to patient    Ramipril     Unknown to patient      Family History  Problem Relation Age of Onset   Diabetes Sister    Hyperlipidemia Daughter    Hypertension Daughter    Diabetes Daughter    Rheum arthritis Daughter    Hypertension Daughter    AAA (abdominal aortic aneurysm) Daughter    Migraines Daughter    Heart attack Neg Hx    Heart disease Neg Hx    Heart failure Neg Hx     Prior to Admission medications   Medication Sig Start Date End Date Taking? Authorizing Provider  cephALEXin (KEFLEX) 500 MG capsule Take 500 mg by mouth 2 (two) times daily. 01/14/21   [provider]  meclizine (ANTIVERT) 12.5 MG tablet Take 12.5 mg by mouth 2 (two) times daily as needed. 05/20/20   [provider]   Multiple Vitamin (MULTI-VITAMINS) TABS Take 1 tablet by mouth daily.    [provider]  Omega-3 1000 MG CAPS Take 2 g by mouth daily.    [provider]  omeprazole (PRILOSEC) 20 MG capsule Take 1 capsule by mouth every other day.  05/05/15   [provider]  oxyCODONE (OXY IR/ROXICODONE) 5 MG immediate release tablet SMARTSIG:5 Milligram(s) By Mouth Every 6 Hours PRN 01/14/21   [provider]  oxyCODONE-acetaminophen (PERCOCET/ROXICET) 5-325 MG tablet Take 1 tablet by mouth every 8 (eight) hours as needed for severe pain. 09/27/16   [provider]  sulfamethoxazole-trimethoprim (BACTRIM DS) 800-160 MG tablet Take 1 tablet by mouth 2 (two) times daily. 01/14/21   [provider]  timolol (TIMOPTIC) 0.25 % ophthalmic solution Place 1 drop  into the right eye every morning. 02/19/20   [provider]    Physical Exam: Vitals:   03/31/21 1333 03/31/21 1335 03/31/21 1534  BP: (!) 149/72  (!) 151/78  Pulse: 67  66  Resp: 16  16  Temp: 98.6 F (37 C)    TempSrc: Oral    SpO2: 100%  100%  Weight:  83.9 kg   Height:   (1.778 m)     Constitutional: NAD, calm, comfortable Eyes: PERRL, lids and conjunctivae normal ENMT: Mucous membranes are moist.  Neck: normal, supple Respiratory: clear to auscultation bilaterally, no wheezing, no crackles.  Cardiovascular: Regular rate and rhythm, no murmurs / rubs / gallops. No extremity edema. Abdomen: no tenderness, no masses palpated. Bowel sounds positive.  Musculoskeletal: no clubbing / cyanosis. Normal muscle tone.  Skin: rash as below Neurologic: CN 2-12 grossly intact. Strength 5/5 in all 4.  Psychiatric: Normal judgment and insight. Alert and oriented x 3. Normal mood.        Labs on Admission: I have personally reviewed following labs and imaging studies  CBC: Recent Labs  Lab 03/31/21 1351  WBC 8.3  NEUTROABS 7.0  HGB 14.7  HCT 43.9  MCV 90.9  PLT 177   Basic  Metabolic Panel: Recent Labs  Lab 03/31/21 1351  NA 141  K 3.8  CL 103  CO2 25  GLUCOSE 137*  BUN 18  CREATININE 0.81  CALCIUM 9.3   Liver Function Tests: No results for input(s): AST, ALT, ALKPHOS, BILITOT, PROT, ALBUMIN in the last 168 hours. Coagulation Profile: No results for input(s): INR, PROTIME in the last 168 hours. BNP (last 3 results) No results for input(s): PROBNP in the last 8760 hours. CBG: No results for input(s): GLUCAP in the last 168 hours. Thyroid Function Tests: No results for input(s): TSH, T4TOTAL, FREET4, T3FREE, THYROIDAB in the last 72 hours. Urine analysis: No results found for: COLORURINE, APPEARANCEUR, LABSPEC, PHURINE, GLUCOSEU, HGBUR, BILIRUBINUR, KETONESUR, PROTEINUR, UROBILINOGEN, NITRITE, LEUKOCYTESUR   Radiological Exams on Admission: DG Foot Complete Right  Result Date: 03/31/2021 CLINICAL DATA:  Swelling and redness EXAM: RIGHT FOOT COMPLETE - 3+ VIEW COMPARISON:  Right foot radiographs 05/12/2020 FINDINGS: There is no acute fracture or dislocation. Deformity of the second metatarsal is suggestive of prior trauma, unchanged. Bony alignment is normal. The Lisfranc and Chopart joints are intact. There is no erosive change. The joint spaces are preserved. There is inferior calcaneal spurring. There is a soft tissue defect along the lateral aspect of the great toe adjacent to the interphalangeal joint. Small locules of lucency may reflect soft tissue gas. There is no destruction of the underlying bone. There is diffuse soft tissue swelling particularly over the dorsum of the foot. Vascular calcifications are noted. IMPRESSION: Soft tissue defect along the lateral aspect of the great toe adjacent to the interphalangeal joint with small locules of lucency which may reflect soft tissue gas. Infection with a gas-forming organism is not excluded. There is no underlying osseous erosion or destruction to suggest definite osteomyelitis. Electronically Signed    By: Lesia Hausen M.D.   On: 03/31/2021 14:40    Assessment/Plan * Cellulitis and abscess of foot- (present on admission) - Patient will be admitted to the hospitalist service with right foot cellulitis and abscess.  He has pus draining from right greater toe.  Cellulitis extends up to mid upper shins.  Placed on antibiotics.  There is concern for gas-forming organisms and add clindamycin along to Vanco and Zosyn.  Cultures were sent in the ED.  Vascular surgery consulted, do not think that this is critical limb ischemia Per EDP.  Patient has a pacemaker, unable to obtain an MRI, will obtain a CT scan with contrast.  Podiatry contacted as well  Chronic systolic CHF (congestive heart failure) ([place]) -Mentioned in cardiology notes, I do not see any prior echoes.  Currently appears euvolemic.  He does not seem to be on any medications for this, awaiting pharmacy reconciliation  CAD (coronary artery disease) with history of CABG -noted, no chest pain or apparent active issues  PAD (peripheral artery disease) ([place]) -vascular consulted  Sick sinus syndrome ([place]) status post pacemaker- (present on admission) - Keep on medical telemetry    DVT prophylaxis: Lovenox Code Status: Full code Family Communication: Daughter present at bedside Disposition Plan: Likely home when ready Bed Type: Medical telemetry Consults called: Podiatry, vascular surgery Obs/Inp: Observation   Marzetta Board, MD, PhD [facility]  Contact via [email or web address]  03/31/2021, 4:37 PM

## 2021-03-31 NOTE — ED Provider Triage Note (Addendum)
Emergency Medicine Provider Triage Evaluation Note  [name] , a 88 y.o. male  was evaluated in triage.  Pt complains of right lower extremity pain.  Was sent here by podiatry, had an abnormal ABI done on 03/22/2021 and was unable to follow-up with vascular.  He has been having pain for the last 3 to 4 days, also worsening erythema..  Review of Systems  Positive: Right lower extremity pain and erythema Negative: Chest pain, short of breath  Physical Exam  BP (!) 149/72 (BP Location: Left Arm)    Pulse 67    Temp 98.6 F (37 C) (Oral)    Resp 16    Ht 5\' 10"  (1.778 m)    Wt 83.9 kg    SpO2 100%    BMI 26.54 kg/m  Gen:   Awake, no distress   Resp:  Normal effort  MSK:   Moves extremities without difficulty  Other:  Pictures reviewed from podiatry note.  Erythematous, right large toe ulcer.  Unable to palpate DP or PT  Medical Decision Making  Medically screening exam initiated at 1:48 PM.  Appropriate orders placed.  was informed that the remainder of the evaluation will be completed by another provider, this initial triage assessment does not replace that evaluation, and the importance of remaining in the ED until their evaluation is complete.  Discussed with Dr. Bennie Pierini.  We will proceed with CT ao bifem with run off and basic labs for now. Concern for CLI  Vascular surgery in room with patient in triage.  Adding patient to schedule for Monday, I think patient will need admission and IV antibiotics.  Does not think patient needs CT scan with runoff at this time, advises broad-spectrum antibiotics and hospital admit and podiatry consult.   Thursday, PA-C 03/31/21 1348    04/02/21, PA-C 03/31/21 1409

## 2021-03-31 NOTE — Consult Note (Signed)
Hospital Consult    Reason for Consult: Right lower extremity wound with nonpalpable pulse Requesting Physician: Emergency department MRN #:  [id number]  History of Present Illness: This is a 88 y.o. male with medical history including CAD status post CABG, sick sinus syndrome with pacemaker, A. fib, not on anticoagulation, right foot wound.  On exam, Karl Balesrzie was pleasant.  He stated the foot wound had been present for a number of weeks, but over the course of the last week has become more red, indurated with worsening pain.  The pain was not present during exam.  The wound has been followed by podiatry in the outpatient setting at [facility], and was sent to the hospital due to concern for cellulitis.  He denies fevers, chills, cough, nausea, vomiting. The erythema and induration has been present for roughly 1 week.  Past Medical History:  Diagnosis Date   Abnormal myocardial perfusion study 09/15/2016   Anticoagulant long-term use 05/05/2015   Aortic atherosclerosis (HCC) 02/16/2016   BMI 31.0-31.9,adult 05/05/2015   CAD in native artery 02/14/2015   Chronic rhinitis 05/05/2015   Coronary artery abnormality 02/14/2015   Coronary artery disease involving coronary bypass graft of native heart without angina pectoris 05/05/2015   Coronary artery disease involving native coronary artery of native heart with angina pectoris (HCC) 05/05/2015   Dizziness 10/05/2016   Elevated PSA 05/05/2015   Saddie Bendershao   Essential hypertension 02/14/2015   GERD (gastroesophageal reflux disease) 05/05/2015   High risk medication use 05/05/2015   History of cardiac radiofrequency ablation 09/15/2016   History of small bowel obstruction 03/05/2017   Hx of CABG 09/15/2016   Hyperlipidemia 02/14/2015   Impacted cerumen, bilateral 04/24/2018   Lumbar degenerative disc disease 05/05/2015   Malaise and fatigue 05/05/2015   Last Assessment & Plan:  Relevant Hx: Course: Daily Update: Today's Plan:he admits that this can be prominent for him  and he had UA checked to ensure not having infection which he was concerned about and reassured him of this, encouraged him to remain active and his meds contribute to this as well which he is aware of   Electronically signed by: Krystal ClarkMelissa Joyce Bores-Patram, NP 08/12/15 1303   Old MI (myocardial infarction) 09/15/2016   PAF (paroxysmal atrial fibrillation) (HCC) 12/31/2017   Telemetry detected   Partial small bowel obstruction (HCC) 03/05/2017   Prediabetes 05/05/2015   Preoperative cardiovascular examination 02/14/2015   Presence of cardiac pacemaker 02/14/2015   For symptomatic sinus bradycardia after flutter ablation For symptomatic sinus bradycardia after flutter ablation   Primary osteoarthritis involving multiple joints 05/05/2015   Last Assessment & Plan:  Relevant Hx: Course: Daily Update: Today's Plan:he has known OA to his joints and he has seen chiropractor in the past for this and he states that he was told he had degenerative changes to his back several times. The knee needs to be xrayed to the right with the swelling , he is taking the oxycodone only as needed but it works well he does not feel dizzy whereas the aleve   RBBB with left anterior fascicular block 02/14/2015   Recurrent incisional hernia 03/05/2017   Sick sinus syndrome (HCC) 02/17/2015   SSS (sick sinus syndrome) (HCC) 02/17/2015   Thrombocytopenia due to drugs 07/16/2017   Typical atrial flutter (HCC) 05/05/2015   Last Assessment & Plan:  Warfarin was stopped September 07, 2016 by physicians assistant or [place] cardiology after consistent monitoring showed no recurrence of A. fib for more than 1-1/2 years   Ventral  hernia without obstruction or gangrene 05/05/2015    Past Surgical History:  Procedure Laterality Date   ATRIAL FLUTTER ABLATION     COLON SURGERY     CORONARY ARTERY BYPASS GRAFT     INSERT / REPLACE / REMOVE PACEMAKER      Allergies  Allergen Reactions   Amiodarone Nausea And Vomiting    vomiting     Gabapentin     dizziness     Metoprolol Other (See Comments)    Drop in heart rate, dizziness.     Statins     myalgias   Doxycycline     Unknown to patient    Neomycin     Unknown to patient     Niacin     Unknown to patient    Ramipril     Unknown to patient      Prior to Admission medications   Medication Sig Start Date End Date Taking? Authorizing Provider  acetaminophen (TYLENOL) 500 MG tablet Take 500 mg by mouth every 6 (six) hours as needed for mild pain.   Yes [provider]  hydroxypropyl methylcellulose / hypromellose (ISOPTO TEARS / GONIOVISC) 2.5 % ophthalmic solution Place 1 drop into both eyes daily as needed for dry eyes.   Yes [provider]  ibuprofen (ADVIL) 200 MG tablet Take 200 mg by mouth every 6 (six) hours as needed for mild pain.   Yes [provider]  Nutritional Supplements (PROSTATE PO) Take 1 tablet by mouth daily.   Yes [provider]    Social History   Socioeconomic History   Marital status: Divorced    Spouse name: Not on file   Number of children: Not on file   Years of education: Not on file   Highest education level: Not on file  Occupational History   Not on file  Tobacco Use   Smoking status: Never   Smokeless tobacco: Never  Vaping Use   Vaping Use: Never used  Substance and Sexual Activity   Alcohol use: Never   Drug use: Never   Sexual activity: Not on file  Other Topics Concern   Not on file  Social History Narrative   Not on file   Social Determinants of Health   Financial Resource Strain: Not on file  Food Insecurity: Not on file  Transportation Needs: Not on file  Physical Activity: Not on file  Stress: Not on file  Social Connections: Not on file  Intimate Partner Violence: Not on file     Family History  Problem Relation Age of Onset   Diabetes Sister    Hyperlipidemia Daughter    Hypertension Daughter    Diabetes Daughter    Rheum arthritis Daughter     Hypertension Daughter    AAA (abdominal aortic aneurysm) Daughter    Migraines Daughter    Heart attack Neg Hx    Heart disease Neg Hx    Heart failure Neg Hx     ROS: Otherwise negative unless mentioned in HPI  Physical Examination  Vitals:   03/31/21 1534 03/31/21 1643  BP: (!) 151/78 133/68  Pulse: 66 68  Resp: 16 16  Temp:    SpO2: 100% 99%   Body mass index is 26.54 kg/m.  General:  WDWN in NAD Gait: Not observed HENT: WNL, normocephalic Pulmonary: normal non-labored breathing Cardiac: A. fib Abdomen:  soft, NT/ND, no masses Skin: without rashes Vascular Exam/Pulses: 2+ femoral pulses bilaterally, nonpalpable pedal pulses.  Extremities: without ischemic changes,  with Gangrene , with cellulitis; with open wounds;      Musculoskeletal: no muscle wasting or atrophy  Neurologic: A&O X 3;  No focal weakness or paresthesias are detected; speech is fluent/normal Psychiatric:  The pt has Normal affect. Lymph:  Unremarkable  CBC    Component Value Date/Time   WBC 8.3 03/31/2021 1351   RBC 4.83 03/31/2021 1351   HGB 14.7 03/31/2021 1351   HCT 43.9 03/31/2021 1351   PLT 177 03/31/2021 1351   MCV 90.9 03/31/2021 1351   MCH 30.4 03/31/2021 1351   MCHC 33.5 03/31/2021 1351   RDW 11.6 03/31/2021 1351   LYMPHSABS 0.9 03/31/2021 1351   MONOABS 0.4 03/31/2021 1351   EOSABS 0.0 03/31/2021 1351   BASOSABS 0.0 03/31/2021 1351    BMET    Component Value Date/Time   NA 141 03/31/2021 1351   NA 140 07/08/2019 0932   K 3.8 03/31/2021 1351   CL 103 03/31/2021 1351   CO2 25 03/31/2021 1351   GLUCOSE 137 (H) 03/31/2021 1351   BUN 18 03/31/2021 1351   BUN 21 07/08/2019 0932   CREATININE 0.81 03/31/2021 1351   CALCIUM 9.3 03/31/2021 1351   GFRNONAA >60 03/31/2021 1351   GFRAA 89 07/08/2019 0932    COAGS: No results found for: INR, PROTIME   Non-Invasive Vascular Imaging:    ABI Findings:  +---------+------------------+-----+-------------------+--------+    Right     Rt Pressure (mmHg) Index Waveform            Comment    +---------+------------------+-----+-------------------+--------+   Brachial  162                                                     +---------+------------------+-----+-------------------+--------+   PTA       48                 0.29  dampened monophasic            +---------+------------------+-----+-------------------+--------+   DP                                 absent                         +---------+------------------+-----+-------------------+--------+   Great Toe                          Absent                         +---------+------------------+-----+-------------------+--------+   +---------+------------------+-----+---------+-------+   Left      Lt Pressure (mmHg) Index Waveform  Comment   +---------+------------------+-----+---------+-------+   Brachial  166                                          +---------+------------------+-----+---------+-------+   PTA       162                0.98  triphasic           +---------+------------------+-----+---------+-------+   DP        148  0.89  biphasic            +---------+------------------+-----+---------+-------+   Great Toe 37                 0.22                      +---------+------------------+-----+---------+-------+   ASSESSMENT/PLAN: This is a 88 y.o. male with right lower extremity great toe wound that has been present for a number of weeks.  There is significant cellulitis in the foot.  Vital signs are normal with no ascending cellulitis, or lymphangitis concerning for rapid, ascending infection. ABIs were reviewed demonstrating severe peripheral arterial disease in the right leg.  Mild disease present in the left leg.  Koury's peripheral arterial disease is classified as right lower extremity Rutherford 5 critical limb ischemia.  He would benefit from right lower extremity angiogram in an effort to define and hopefully improve distal  perfusion to the foot. The wound, even after debridement, will not heal without improved perfusion. Being that the patient has atrial fibrillation, and is not anticoagulated, a possible source is cardioembolic, especially with the ABI asymmetry seen between the left and right leg blood flow.  Fortunately, Earnest remains asymptomatic without rest pain, or sensory or motor deficits.  I have evaluated our group's schedule and will tentatively plan Monday for right lower extremity angiogram.  I will defer toe management to podiatry.  If we are unable to improve lower extremity perfusion, [place] will require a higher level of amputation.  Recommend broad-spectrum antibiotics Plan for angiogram Monday Please consider echocardiography to ensure no cardiac thrombus  I will discuss the above with his daughter.  Fara OldenJ. Eli Ahmyah Gidley MD MS [facility] [phone] 03/31/2021  6:02 PM

## 2021-03-31 NOTE — ED Triage Notes (Signed)
Pt arrived POV from drs office c/o right foot pain x 3-4 days ago. Pt states he was sent her to rule out a blood clot. Pt's foot is red, swollen and tender to touch.

## 2021-03-31 NOTE — Treatment Plan (Signed)
Will see patient 1/20, try to debride at bedside to allow infection to drain, would prefer any major debridement/amputation to follow revascularization but if not improving with broad spectum abx would precede that   Sharl Ma, DPM 03/31/2021

## 2021-03-31 NOTE — Assessment & Plan Note (Addendum)
-  noted, no chest pain or apparent active issues -cath Monday

## 2021-03-31 NOTE — Progress Notes (Signed)
Subjective:  Patient ID: [name], male    DOB: 04-14-1933,  MRN: [id number]  Chief Complaint  Patient presents with   Foot Ulcer    The right big toe is about the same and I went to the vascular study and was going back to the doctor and get lost and they would not see [place]   88 y.o. male presents with the above complaint. History confirmed with patient. Did have vascular studies done and was scheduled to see a vascular doctor the following day, but unfortunately got lost and was unable to make appt. The next appt was scheduled for 04/08/21. Objective:  Physical Exam: cool, delayed good capillary refill, nail exam onychomycosis of the toenails, no trophic changes or ulcerative lesions. protective sensation absent. Pedal pulses non-palpable.   Right hallux ulceration approx 2x2 with wet eschar with malodor. Foot cool to touch. Dependent rubor noted.   LOWER EXTREMITY DOPPLER STUDY   Patient Name:  [name]  Date of Exam:   03/22/2021  Medical Rec #: [id number]      Accession #:    [id number]  Date of Birth: [birth year]       Patient Gender: M  Patient Age:   88 years  Exam Location:  [facility]  Procedure:      VAS [place] ABI WITH/WO TBI  Referring Phys:    ---------------------------------------------------------------------------  -----     Indications: Ulceration involving the right great toe with lower leg  redness and               loss of pedal pulses, unknown duration.   High Risk Factors: Hyperlipidemia, coronary artery disease.     Performing Technologist: Dorthula Matas RVS, RCS   Examination Guidelines: A complete evaluation includes at minimum, Doppler  waveform signals and systolic blood pressure reading at the level of  bilateral  brachial, anterior tibial, and posterior tibial arteries, when vessel  segments  are accessible. Bilateral testing is considered an integral part of a  complete  examination. Photoelectric Plethysmograph (PPG)  waveforms and toe systolic  pressure readings are included as required and additional duplex testing  as  needed. Limited examinations for reoccurring indications may be performed  as  noted.      ABI Findings:  +---------+------------------+-----+-------------------+--------+   Right     Rt Pressure (mmHg) Index Waveform            Comment    +---------+------------------+-----+-------------------+--------+   Brachial  162                                                     +---------+------------------+-----+-------------------+--------+   PTA       48                 0.29  dampened monophasic            +---------+------------------+-----+-------------------+--------+   DP                                 absent                         +---------+------------------+-----+-------------------+--------+   Great Toe  Absent                         +---------+------------------+-----+-------------------+--------+   +---------+------------------+-----+---------+-------+   Left      Lt Pressure (mmHg) Index Waveform  Comment   +---------+------------------+-----+---------+-------+   Brachial  166                                          +---------+------------------+-----+---------+-------+   PTA       162                0.98  triphasic           +---------+------------------+-----+---------+-------+   DP        148                0.89  biphasic            +---------+------------------+-----+---------+-------+   Great Toe 37                 0.22                      +---------+------------------+-----+---------+-------+   +-------+-----------+-----------+------------+------------+   ABI/TBI Today's ABI Today's TBI Previous ABI Previous TBI   +-------+-----------+-----------+------------+------------+   Right   0.29        0.00                                    +-------+-----------+-----------+------------+------------+   Left    0.98        0.22                                     +-------+-----------+-----------+------------+------------+   Patient returning 03/25/2021 for MD appt.     Summary:  Right: Resting right ankle-brachial index indicates critical limb  ischemia.   Left: Resting left ankle-brachial index is within normal range. No  evidence of significant left lower extremity arterial disease. The left  toe-brachial index is abnormal.  Assessment:   1. PAD (peripheral artery disease) (HCC)   2. Critical limb ischemia of both lower extremities (HCC)   3. Ulcer of great toe, right, limited to breakdown of skin [facility])     Plan:  Patient was evaluated and treated and all questions answered.  Toe Ulcer, PAD with concern for CLI -He is not wearing his surgical shoe today -He had vascular studies which confirmed CLI. Unfortunately was unable to follow up in the office as he got lost on the way there. New appt is not until 04/08/21. Given significant deterioration since last visit with concern with new early wet gangrene will send to [facility] ED for eval. He would benefit from admission and vascular consultation.   No follow-ups on file.

## 2021-03-31 NOTE — Assessment & Plan Note (Addendum)
-   Keep on medical telemetry.  He is paced

## 2021-03-31 NOTE — ED Provider Notes (Signed)
[facility] EMERGENCY DEPARTMENT Provider Note   CSN: [id number] Arrival date & time: 03/31/21  1324     History  Chief Complaint  Patient presents with   Foot Pain    [name] is a 88 y.o. male.  The history is provided by the patient, the spouse and medical records. No language interpreter was used.  Foot Pain This is a new problem. The current episode started more than 1 week ago. The problem occurs constantly. The problem has been gradually worsening. Pertinent negatives include no chest pain, no abdominal pain, no headaches and no shortness of breath. Nothing aggravates the symptoms. Nothing relieves the symptoms. He has tried nothing for the symptoms. The treatment provided no relief.      Home Medications Prior to Admission medications   Medication Sig Start Date End Date Taking? Authorizing Provider  cephALEXin (KEFLEX) 500 MG capsule Take 500 mg by mouth 2 (two) times daily. 01/14/21   [provider]  meclizine (ANTIVERT) 12.5 MG tablet Take 12.5 mg by mouth 2 (two) times daily as needed. 05/20/20   [provider]  Multiple Vitamin (MULTI-VITAMINS) TABS Take 1 tablet by mouth daily.    [provider]  Omega-3 1000 MG CAPS Take 2 g by mouth daily.    [provider]  omeprazole (PRILOSEC) 20 MG capsule Take 1 capsule by mouth every other day.  05/05/15   [provider]  oxyCODONE (OXY IR/ROXICODONE) 5 MG immediate release tablet SMARTSIG:5 Milligram(s) By Mouth Every 6 Hours PRN 01/14/21   [provider]  oxyCODONE-acetaminophen (PERCOCET/ROXICET) 5-325 MG tablet Take 1 tablet by mouth every 8 (eight) hours as needed for severe pain. 09/27/16   [provider]  sulfamethoxazole-trimethoprim (BACTRIM DS) 800-160 MG tablet Take 1 tablet by mouth 2 (two) times daily. 01/14/21   [provider]  timolol (TIMOPTIC) 0.25 % ophthalmic solution Place 1 drop into the right eye every morning.  02/19/20   [provider]      Allergies    Amiodarone, Gabapentin, Metoprolol, Statins, Doxycycline, Neomycin, Niacin, and Ramipril    Review of Systems   Review of Systems  Constitutional:  Positive for fatigue. Negative for chills and fever.  HENT:  Negative for congestion.   Eyes:  Negative for visual disturbance.  Respiratory:  Negative for cough, chest tightness, shortness of breath and wheezing.   Cardiovascular:  Positive for leg swelling. Negative for chest pain and palpitations.  Gastrointestinal:  Negative for abdominal pain, constipation, diarrhea, nausea and vomiting.  Genitourinary:  Negative for dysuria and flank pain.  Musculoskeletal:  Negative for back pain, neck pain and neck stiffness.  Skin:  Positive for rash and wound.  Neurological:  Negative for dizziness, weakness, light-headedness, numbness and headaches.  All other systems reviewed and are negative.  Physical Exam Updated Vital Signs BP (!) 149/72 (BP Location: Left Arm)    Pulse 67    Temp 98.6 F (37 C) (Oral)    Resp 16    Ht 5\' 10"  (1.778 m)    Wt 83.9 kg    SpO2 100%    BMI 26.54 kg/m  Physical Exam Vitals and nursing note reviewed.  Constitutional:      General: He is not in acute distress.    Appearance: He is well-developed. He is not ill-appearing, toxic-appearing or diaphoretic.  HENT:     Head: Normocephalic and atraumatic.     Nose: No congestion.     Mouth/Throat:  Mouth: Mucous membranes are moist.  Eyes:     Conjunctiva/sclera: Conjunctivae normal.     Pupils: Pupils are equal, round, and reactive to light.  Cardiovascular:     Rate and Rhythm: Normal rate and regular rhythm.     Heart sounds: No murmur heard. Pulmonary:     Effort: Pulmonary effort is normal. No respiratory distress.     Breath sounds: Normal breath sounds. No wheezing, rhonchi or rales.  Chest:     Chest wall: No tenderness.  Abdominal:     General: Abdomen is flat.     Palpations: Abdomen is  soft.     Tenderness: There is no abdominal tenderness. There is no guarding or rebound.  Musculoskeletal:        General: Swelling and tenderness present.     Cervical back: Neck supple.     Right foot: Decreased capillary refill. No laceration.     Comments: Dopplerable pulse was found in both legs and both DP and PT arteries.  It was more monophasic and right leg compared to left.  There is swelling and redness although minimal tenderness.  Sluggish cap refill on right side compared to left.  Wound present on right toe.  Skin:    General: Skin is warm and dry.     Capillary Refill: Capillary refill takes less than 2 seconds.     Findings: Erythema present.  Neurological:     General: No focal deficit present.     Mental Status: He is alert.     Sensory: No sensory deficit.     Motor: No weakness.  Psychiatric:        Mood and Affect: Mood normal.    ED Results / Procedures / Treatments   Labs (all labs ordered are listed, but only abnormal results are displayed) Labs Reviewed  BASIC METABOLIC PANEL - Abnormal; Notable for the following components:      Result Value   Glucose, Bld 137 (*)    All other components within normal limits  RESP PANEL BY RT-PCR (FLU A&B, COVID) ARPGX2  CBC WITH DIFFERENTIAL/PLATELET  LACTIC ACID, PLASMA  LACTIC ACID, PLASMA    EKG None  Radiology DG Foot Complete Right  Result Date: 03/31/2021 CLINICAL DATA:  Swelling and redness EXAM: RIGHT FOOT COMPLETE - 3+ VIEW COMPARISON:  Right foot radiographs 05/12/2020 FINDINGS: There is no acute fracture or dislocation. Deformity of the second metatarsal is suggestive of prior trauma, unchanged. Bony alignment is normal. The Lisfranc and Chopart joints are intact. There is no erosive change. The joint spaces are preserved. There is inferior calcaneal spurring. There is a soft tissue defect along the lateral aspect of the great toe adjacent to the interphalangeal joint. Small locules of lucency may  reflect soft tissue gas. There is no destruction of the underlying bone. There is diffuse soft tissue swelling particularly over the dorsum of the foot. Vascular calcifications are noted. IMPRESSION: Soft tissue defect along the lateral aspect of the great toe adjacent to the interphalangeal joint with small locules of lucency which may reflect soft tissue gas. Infection with a gas-forming organism is not excluded. There is no underlying osseous erosion or destruction to suggest definite osteomyelitis. Electronically Signed   By: Valetta Mole M.D.   On: 03/31/2021 14:40    Procedures Procedures    Medications Ordered in ED Medications  vancomycin (VANCOREADY) IVPB 2000 mg/400 mL (2,000 mg Intravenous New Bag/Given 03/31/21 1639)  vancomycin (VANCOREADY) IVPB 1750 mg/350 mL (has no administration  in time range)  piperacillin-tazobactam (ZOSYN) IVPB 3.375 g (0 g Intravenous Stopped 03/31/21 1633)    CRITICAL CARE Performed by: Gwenyth Allegra Jaryn Hocutt Total critical care time: 35 minutes Critical care time was exclusive of separately billable procedures and treating other patients. Critical care was necessary to treat or prevent imminent or life-threatening deterioration. Critical care was time spent personally by me on the following activities: development of treatment plan with patient and/or surrogate as well as nursing, discussions with consultants, evaluation of patient's response to treatment, examination of patient, obtaining history from patient or surrogate, ordering and performing treatments and interventions, ordering and review of laboratory studies, ordering and review of radiographic studies, pulse oximetry and re-evaluation of patient's condition.   ED Course/ Medical Decision Making/ A&P                           Medical Decision Making Amount and/or Complexity of Data Reviewed Labs: ordered.  Risk Prescription drug management. Decision regarding hospitalization.   [name] is a 88 y.o. male with a past medical history significant for CAD status post CABG, hypertension, hyperlipidemia, sick sinus syndrome status post pacemaker, paroxysmal atrial fibrillation not on anticoagulation currently, GERD, previous small bowel obstruction, and reported prostate troubles who presents at the direction of his podiatry for worsening foot problem.  According to patient, for the last few weeks he has had worsening pain in his right foot has become more red.  He reports it is not hurting currently but it is getting more swollen and red.  He went to podiatry and was told they thought he might have a clot.  Patient was sent to the emergency part immediately for evaluation.  He is denying other fevers, chills congestion, cough, nausea, vomiting, constipation, diarrhea.  He does report some mild fatigue.  He is denying pain surprisingly.  He says that he has had a wound there for some time but the redness and swelling has only been more recent.  On exam, lungs clear and chest nontender.  Abdomen nontender.  Patient has erythema in the right foot with significant swelling.  There is an ulcer on the medial side of the great toe.  He is able to wiggle his toes and has reported intact sensation although he says that he has had some chronic problems after previous injury.  I was able to use Doppler and got both DP and PT pulses on both feet however the right side was more of a constant flow than good pulses.  By report from [place] team, vascular surgery was involved shortly after arrival as they were already in the emergency department.  They reportedly said that patient needs antibiotics and admission.   3:35 PM Just spoke to podiatry who was able to review the case and images.  They suspect that the soft tissue gas seen on the x-ray is actually related to the wound more locally as opposed to a gas containing infection.  They agree with IV antibiotics and medicine admission and they will follow  along with vascular surgery.  They do feel that patient may end up needing an amputation or some other surgical procedure if symptoms worsen however they did not feel he needs n.p.o. status at this time.  They said that they would be able to manage the wound and infection and orthopedics was not needed at this time.  Given this significant infection in the right foot in a lady with peripheral vascular  disease, I am concerned this is a foot threatening condition.  Consultants will follow and medicine will admit to status infection.  Will call medicine for admission for further management.         Final Clinical Impression(s) / ED Diagnoses Final diagnoses:  Foot pain, right  Cellulitis of right lower extremity     Clinical Impression: 1. Foot pain, right   2. Cellulitis of right lower extremity     Disposition: Admit  This note was prepared with assistance of Dragon voice recognition software. Occasional wrong-word or sound-a-like substitutions may have occurred due to the inherent limitations of voice recognition software.      Saul Fabiano, Gwenyth Allegra, MD 03/31/21 [phone]

## 2021-03-31 NOTE — ED Notes (Signed)
Received verbal report from Garner Gavel RN

## 2021-03-31 NOTE — ED Notes (Signed)
Pt continuing to c/o pain in lower abd. States that he feels like he needs to urinate or have a bm. Pt has a condom cath in place with urine in the tube. Pt explained this at this time. Pt states that he doesn't feel like he is urinating. Pt asking about standing up to urinate pt advised at this time that he is unable to do so due to risk of falling. Pt previously given pain meds. Advised pt that I had given him his pain medication and would need to page the admit doctor. Requested from Diplomatic Services operational officer to page admit doctor

## 2021-03-31 NOTE — Assessment & Plan Note (Addendum)
-   Patient admitted with right foot cellulitis and abscess.  - Could not get an MRI due to pacemaker.  -initially: pus draining from right greater toe/Cellulitis extends up to mid upper shins.  -antibiotics-- IV vanc/zosyn initially, narrowed to rocephin but patient developed an itching rash (unclear if related to new abx but timing coincides)- changed back to vanc/zosyn (also redness on IV rocephin seems to have worsened) -Blood cultures: no growth to date.   -Vascular surgery consulted, underwent arteriogram 1/23, no possible intervention and he is to be considered for right femoral to anterior tibial artery bypass.  Pending cardiac evaluation prior to surgery- now plans for [facility] on Monday -Podiatry consulted as well:.  Bone scan does not show osteomyelitis

## 2021-04-01 ENCOUNTER — Observation Stay (HOSPITAL_COMMUNITY): Payer: Medicare Other

## 2021-04-01 DIAGNOSIS — I495 Sick sinus syndrome: Secondary | ICD-10-CM | POA: Diagnosis present

## 2021-04-01 DIAGNOSIS — I5022 Chronic systolic (congestive) heart failure: Secondary | ICD-10-CM | POA: Diagnosis not present

## 2021-04-01 DIAGNOSIS — R739 Hyperglycemia, unspecified: Secondary | ICD-10-CM | POA: Diagnosis present

## 2021-04-01 DIAGNOSIS — Z951 Presence of aortocoronary bypass graft: Secondary | ICD-10-CM | POA: Diagnosis not present

## 2021-04-01 DIAGNOSIS — I771 Stricture of artery: Secondary | ICD-10-CM | POA: Diagnosis not present

## 2021-04-01 DIAGNOSIS — I70235 Atherosclerosis of native arteries of right leg with ulceration of other part of foot: Secondary | ICD-10-CM | POA: Diagnosis not present

## 2021-04-01 DIAGNOSIS — L97519 Non-pressure chronic ulcer of other part of right foot with unspecified severity: Secondary | ICD-10-CM | POA: Diagnosis present

## 2021-04-01 DIAGNOSIS — L02619 Cutaneous abscess of unspecified foot: Secondary | ICD-10-CM | POA: Diagnosis not present

## 2021-04-01 DIAGNOSIS — H3530 Unspecified macular degeneration: Secondary | ICD-10-CM | POA: Diagnosis present

## 2021-04-01 DIAGNOSIS — L03119 Cellulitis of unspecified part of limb: Secondary | ICD-10-CM | POA: Diagnosis present

## 2021-04-01 DIAGNOSIS — I70221 Atherosclerosis of native arteries of extremities with rest pain, right leg: Secondary | ICD-10-CM | POA: Diagnosis present

## 2021-04-01 DIAGNOSIS — R9431 Abnormal electrocardiogram [ECG] [EKG]: Secondary | ICD-10-CM

## 2021-04-01 DIAGNOSIS — R3911 Hesitancy of micturition: Secondary | ICD-10-CM

## 2021-04-01 DIAGNOSIS — R9439 Abnormal result of other cardiovascular function study: Secondary | ICD-10-CM | POA: Diagnosis not present

## 2021-04-01 DIAGNOSIS — I4892 Unspecified atrial flutter: Secondary | ICD-10-CM

## 2021-04-01 DIAGNOSIS — Z20822 Contact with and (suspected) exposure to covid-19: Secondary | ICD-10-CM | POA: Diagnosis present

## 2021-04-01 DIAGNOSIS — I480 Paroxysmal atrial fibrillation: Secondary | ICD-10-CM | POA: Diagnosis present

## 2021-04-01 DIAGNOSIS — I70261 Atherosclerosis of native arteries of extremities with gangrene, right leg: Secondary | ICD-10-CM | POA: Diagnosis not present

## 2021-04-01 DIAGNOSIS — L02612 Cutaneous abscess of left foot: Secondary | ICD-10-CM | POA: Diagnosis present

## 2021-04-01 DIAGNOSIS — E785 Hyperlipidemia, unspecified: Secondary | ICD-10-CM | POA: Diagnosis present

## 2021-04-01 DIAGNOSIS — L03116 Cellulitis of left lower limb: Secondary | ICD-10-CM | POA: Diagnosis present

## 2021-04-01 DIAGNOSIS — I252 Old myocardial infarction: Secondary | ICD-10-CM | POA: Diagnosis not present

## 2021-04-01 DIAGNOSIS — I452 Bifascicular block: Secondary | ICD-10-CM | POA: Diagnosis present

## 2021-04-01 DIAGNOSIS — I70223 Atherosclerosis of native arteries of extremities with rest pain, bilateral legs: Secondary | ICD-10-CM | POA: Diagnosis not present

## 2021-04-01 DIAGNOSIS — I2510 Atherosclerotic heart disease of native coronary artery without angina pectoris: Secondary | ICD-10-CM | POA: Diagnosis present

## 2021-04-01 DIAGNOSIS — E782 Mixed hyperlipidemia: Secondary | ICD-10-CM | POA: Diagnosis not present

## 2021-04-01 DIAGNOSIS — Z950 Presence of cardiac pacemaker: Secondary | ICD-10-CM | POA: Diagnosis not present

## 2021-04-01 DIAGNOSIS — I472 Ventricular tachycardia, unspecified: Secondary | ICD-10-CM | POA: Diagnosis present

## 2021-04-01 DIAGNOSIS — G629 Polyneuropathy, unspecified: Secondary | ICD-10-CM | POA: Diagnosis present

## 2021-04-01 DIAGNOSIS — I5042 Chronic combined systolic (congestive) and diastolic (congestive) heart failure: Secondary | ICD-10-CM | POA: Diagnosis present

## 2021-04-01 DIAGNOSIS — S91301A Unspecified open wound, right foot, initial encounter: Secondary | ICD-10-CM | POA: Diagnosis not present

## 2021-04-01 DIAGNOSIS — L03115 Cellulitis of right lower limb: Secondary | ICD-10-CM | POA: Diagnosis present

## 2021-04-01 DIAGNOSIS — I70291 Other atherosclerosis of native arteries of extremities, right leg: Secondary | ICD-10-CM | POA: Diagnosis not present

## 2021-04-01 DIAGNOSIS — L270 Generalized skin eruption due to drugs and medicaments taken internally: Secondary | ICD-10-CM | POA: Diagnosis present

## 2021-04-01 DIAGNOSIS — R931 Abnormal findings on diagnostic imaging of heart and coronary circulation: Secondary | ICD-10-CM | POA: Diagnosis not present

## 2021-04-01 DIAGNOSIS — I255 Ischemic cardiomyopathy: Secondary | ICD-10-CM | POA: Diagnosis present

## 2021-04-01 DIAGNOSIS — I25810 Atherosclerosis of coronary artery bypass graft(s) without angina pectoris: Secondary | ICD-10-CM | POA: Diagnosis present

## 2021-04-01 DIAGNOSIS — I10 Essential (primary) hypertension: Secondary | ICD-10-CM | POA: Diagnosis not present

## 2021-04-01 DIAGNOSIS — I110 Hypertensive heart disease with heart failure: Secondary | ICD-10-CM | POA: Diagnosis present

## 2021-04-01 DIAGNOSIS — R079 Chest pain, unspecified: Secondary | ICD-10-CM | POA: Diagnosis not present

## 2021-04-01 DIAGNOSIS — L02611 Cutaneous abscess of right foot: Secondary | ICD-10-CM | POA: Diagnosis present

## 2021-04-01 DIAGNOSIS — Z01810 Encounter for preprocedural cardiovascular examination: Secondary | ICD-10-CM | POA: Diagnosis not present

## 2021-04-01 DIAGNOSIS — S91101A Unspecified open wound of right great toe without damage to nail, initial encounter: Secondary | ICD-10-CM | POA: Diagnosis not present

## 2021-04-01 HISTORY — DX: Hesitancy of micturition: R39.11

## 2021-04-01 HISTORY — DX: Unspecified atrial flutter: I48.92

## 2021-04-01 LAB — ECHOCARDIOGRAM COMPLETE
AR max vel: 2.19 cm2
AV Area VTI: 2.32 cm2
AV Area mean vel: 2.19 cm2
AV Mean grad: 4 mmHg
AV Peak grad: 7.5 mmHg
Ao pk vel: 1.37 m/s
Area-P 1/2: 4.31 cm2
Calc EF: 47.7 %
Height: 70 in
MV VTI: 2.13 cm2
S' Lateral: 3.4 cm
Single Plane A2C EF: 51 %
Single Plane A4C EF: 44.5 %
Weight: 2960 oz

## 2021-04-01 LAB — CBC
HCT: 44.9 % (ref 39.0–52.0)
Hemoglobin: 14.8 g/dL (ref 13.0–17.0)
MCH: 30.2 pg (ref 26.0–34.0)
MCHC: 33 g/dL (ref 30.0–36.0)
MCV: 91.6 fL (ref 80.0–100.0)
Platelets: 172 10*3/uL (ref 150–400)
RBC: 4.9 MIL/uL (ref 4.22–5.81)
RDW: 11.7 % (ref 11.5–15.5)
WBC: 7.3 10*3/uL (ref 4.0–10.5)
nRBC: 0 % (ref 0.0–0.2)

## 2021-04-01 LAB — COMPREHENSIVE METABOLIC PANEL
ALT: 18 U/L (ref 0–44)
AST: 25 U/L (ref 15–41)
Albumin: 3.2 g/dL — ABNORMAL LOW (ref 3.5–5.0)
Alkaline Phosphatase: 69 U/L (ref 38–126)
Anion gap: 10 (ref 5–15)
BUN: 15 mg/dL (ref 8–23)
CO2: 25 mmol/L (ref 22–32)
Calcium: 9 mg/dL (ref 8.9–10.3)
Chloride: 105 mmol/L (ref 98–111)
Creatinine, Ser: 0.97 mg/dL (ref 0.61–1.24)
GFR, Estimated: >60 mL/min (ref >60)
Glucose, Bld: 98 mg/dL (ref 70–99)
Potassium: 4 mmol/L (ref 3.5–5.1)
Sodium: 140 mmol/L (ref 135–145)
Total Bilirubin: 0.8 mg/dL (ref 0.3–1.2)
Total Protein: 6.2 g/dL — ABNORMAL LOW (ref 6.5–8.1)

## 2021-04-01 LAB — HEMOGLOBIN A1C
Hgb A1c MFr Bld: 5.5 % (ref 4.8–5.6)
Mean Plasma Glucose: 111 mg/dL

## 2021-04-01 MED ORDER — PERFLUTREN LIPID MICROSPHERE
1.0000 mL | INTRAVENOUS | Status: AC | PRN
  Administered 2021-04-01: 2 mL via INTRAVENOUS
  Filled 2021-04-01: qty 10

## 2021-04-01 MED ORDER — TAMSULOSIN HCL 0.4 MG PO CAPS
0.4000 mg | ORAL_CAPSULE | Freq: Every day | ORAL | Status: DC
  Administered 2021-04-01 – 2021-04-15 (×15): 0.4 mg via ORAL
  Filled 2021-04-01 (×16): qty 1

## 2021-04-01 MED ORDER — ALUM & MAG HYDROXIDE-SIMETH 200-200-20 MG/5ML PO SUSP
30.0000 mL | Freq: Four times a day (QID) | ORAL | Status: DC | PRN
  Administered 2021-04-01 – 2021-04-02 (×2): 30 mL via ORAL
  Filled 2021-04-01 (×2): qty 30

## 2021-04-01 MED ORDER — ACETAMINOPHEN 325 MG PO TABS
650.0000 mg | ORAL_TABLET | Freq: Four times a day (QID) | ORAL | Status: DC | PRN
  Administered 2021-04-01: 15:00:00 650 mg via ORAL
  Filled 2021-04-01: qty 2

## 2021-04-01 MED ORDER — CLINDAMYCIN PHOSPHATE 600 MG/50ML IV SOLN
600.0000 mg | Freq: Three times a day (TID) | INTRAVENOUS | Status: DC
  Administered 2021-04-01 – 2021-04-04 (×10): 600 mg via INTRAVENOUS
  Filled 2021-04-01 (×10): qty 50

## 2021-04-01 NOTE — Assessment & Plan Note (Addendum)
-  Monitor blood pressure, currently not taking any medications, has remained within acceptable parameters

## 2021-04-01 NOTE — Assessment & Plan Note (Addendum)
Chronic. 

## 2021-04-01 NOTE — TOC Progression Note (Signed)
Transition of Care [facility]) - Progression Note    Patient Details  Name: DALLYN BERGLAND MRN: [id number] Date of Birth: 1934-03-01  [facility] CM/SW Contact  Beckie Busing, RN Phone Number:[phone]  04/01/2021, 1:51 PM  Clinical Narrative:     Transition of Care (TOC) Screening Note   Patient Details  Name: JERREN FLINCHBAUGH Date of Birth: 1933/12/18   Transition of Care [facility]) CM/SW Contact:    Beckie Busing, RN Phone Number:[phone]  04/01/2021, 1:51 PM    Transition of Care Department [facility]) has reviewed patient and no TOC needs have been identified at this time. We will continue to monitor patient advancement through interdisciplinary progression rounds. If new patient transition needs arise, please place a TOC consult.          Expected Discharge Plan and Services                                                 Social Determinants of Health (SDOH) Interventions    Readmission Risk Interventions No flowsheet data found.

## 2021-04-01 NOTE — Assessment & Plan Note (Addendum)
-   Possibly due to BPH.  This is been going on for quite some time, but most recently has noticed that he needs to urinate more often during the nighttime.  He is supposed to see urology outpatient - started on Flomax while here, no further issues, urinating well

## 2021-04-01 NOTE — Progress Notes (Signed)
PROGRESS NOTE  TREYMON PIRAINO [id number] DOB: 1933-05-15 DOA: 03/31/2021 PCP: Raina Mina., MD   LOS: 0 days   Brief Narrative / Interim history:  88 y.o. male with medical history significant of coronary artery disease, ischemic cardiomyopathy with chronic systolic CHF with prior EF of 25%, history of CABG in 2006, a flutter status post ablation in 2014, VT, also sick sinus syndrome status post pacemaker.  He has been dealing with a right lower extremity callus for several weeks, seen as an outpatient by podiatry.  Over the last 3 to 4 days, he has noticed increased pain to the right lower extremity, increased swelling and redness.  He has a degree of neuropathy and does not feel much.  He was seen in podiatrist office today, was felt to have significant worsening, cellulitis, and was sent to the emergency room.  Subjective / 24h Interval events: Doing well this morning, denies any chest pain, denies any shortness of breath.  No abdominal pain, no nausea or vomiting.  Assessment & Plan: * Cellulitis and abscess of foot- (present on admission) - Patient will be admitted to the hospitalist service with right foot cellulitis and abscess.  He has pus draining from right greater toe.  Cellulitis extends up to mid upper shins.  Placed on antibiotics.  There is concern for gas-forming organisms and add clindamycin along to Vanco and Zosyn.  Cultures were sent in the ED, still pending this morning.  Vascular surgery consulted, with plans for arteriogram on Monday and meanwhile continue antibiotics.  Patient has a pacemaker, unable to obtain an MRI.  Podiatry consulted as well, to evaluate today but likely will need surgical debridement  Chronic systolic CHF (congestive heart failure) ([place]) -Last seen cardiology at [facility], Dr. Ola Spurr, in 2018.  It was mentioned that the EF at that time was 40 to 45%, and recently had a stress test which was negative.  2D echo done here shows EF 40-45% with mildly  decreased LV function.  Suspect unchanged.  He has no heart failure symptoms, no angina.  Currently he is not on goal-directed therapy, not clear when he stopped the medications but back in 2018 he was on metoprolol 12.5 twice daily as well as aspirin.  CAD (coronary artery disease) with history of CABG -noted, no chest pain or apparent active issues  Urinary hesitancy - Possibly due to BPH.  This is been going on for quite some time, but most recently has noticed that he needs to urinate more often during the nighttime.  He is supposed to see urology next week.  Required In-N-Out cath overnight last night.  Start Flomax today and monitor  PAD (peripheral artery disease) ([place]) -vascular consulted.  Arteriogram on Monday  Sick sinus syndrome ([place]) status post pacemaker- (present on admission) - Keep on medical telemetry  Dizziness -Chronic  Essential hypertension- (present on admission) -Monitor blood pressure, currently not taking any medications   Scheduled Meds:  enoxaparin (LOVENOX) injection  40 mg Subcutaneous Q24H   tamsulosin  0.4 mg Oral QPC supper   Continuous Infusions:  clindamycin (CLEOCIN) IV 600 mg (04/01/21 0551)   piperacillin-tazobactam (ZOSYN)  IV 3.375 g (04/01/21 0839)   vancomycin     PRN Meds:.bisacodyl, oxyCODONE, perflutren lipid microspheres (DEFINITY) IV suspension  Diet Orders (From admission, onward)     Start     Ordered   03/31/21 1758  Diet regular Room service appropriate? Yes; Fluid consistency: Thin  Diet effective now  Question Answer Comment  Room service appropriate? Yes   Fluid consistency: Thin      03/31/21 1757            DVT prophylaxis: enoxaparin (LOVENOX) injection 40 mg Start: 03/31/21 2000   Lab Results  Component Value Date   PLT 172 04/01/2021      Code Status: Full Code  Family Communication: Daughter at bedside on admission, no family at this morning  Status is: Observation  The patient will require  care spanning > 2 midnights and should be moved to inpatient because: Needs IV antibiotics, surgical debridement, arteriogram by vascular surgery  Level of care: Telemetry Medical  Consultants:  Vascular Podiatry   Procedures:  2D echo  Microbiology  Blood cultures - pending  Antimicrobials: Vancomycin / Zosyn / Clindamycin    Objective: Vitals:   04/01/21 0445 04/01/21 0446 04/01/21 0500 04/01/21 0740  BP: 130/79  (!) 158/89 134/75  Pulse: 65  93 70  Resp: 13  19 20   Temp:  98.3 F (36.8 C) 98.5 F (36.9 C) 99.2 F (37.3 C)  TempSrc:  Oral Oral Oral  SpO2: 96%   100%  Weight:      Height:        Intake/Output Summary (Last 24 hours) at 04/01/2021 1055 Last data filed at 04/01/2021 1047 Gross per 24 hour  Intake 897.97 ml  Output 1425 ml  Net -527.03 ml   Wt Readings from Last 3 Encounters:  03/31/21 83.9 kg  12/27/20 87.2 kg  07/08/19 88.2 kg    Examination:  Constitutional: NAD Eyes: no scleral icterus ENMT: Mucous membranes are moist.  Neck: normal, supple Respiratory: clear to auscultation bilaterally, no wheezing, no crackles. Cardiovascular: Regular rate and rhythm, no murmurs / rubs / gallops. No LE edema.  Abdomen: non distended, no tenderness. Bowel sounds positive.  Musculoskeletal: no clubbing / cyanosis.  Skin: no rashes Neurologic: non focal    Data Reviewed: I have independently reviewed following labs and imaging studies  CBC Recent Labs  Lab 03/31/21 1351 04/01/21 0314  WBC 8.3 7.3  HGB 14.7 14.8  HCT 43.9 44.9  PLT 177 172  MCV 90.9 91.6  MCH 30.4 30.2  MCHC 33.5 33.0  RDW 11.6 11.7  LYMPHSABS 0.9  --   MONOABS 0.4  --   EOSABS 0.0  --   BASOSABS 0.0  --     Recent Labs  Lab 03/31/21 1351 03/31/21 1543 04/01/21 0314  NA 141  --  140  K 3.8  --  4.0  CL 103  --  105  CO2 25  --  25  GLUCOSE 137*  --  98  BUN 18  --  15  CREATININE 0.81  --  0.97  CALCIUM 9.3  --  9.0  AST  --   --  25  ALT  --   --  18   ALKPHOS  --   --  69  BILITOT  --   --  0.8  ALBUMIN  --   --  3.2*  LATICACIDVEN  --  1.5  --     ------------------------------------------------------------------------------------------------------------------ No results for input(s): CHOL, HDL, LDLCALC, TRIG, CHOLHDL, LDLDIRECT in the last 72 hours.  No results found for: HGBA1C ------------------------------------------------------------------------------------------------------------------ No results for input(s): TSH, T4TOTAL, T3FREE, THYROIDAB in the last 72 hours.  Invalid input(s): FREET3  Cardiac Enzymes No results for input(s): CKMB, TROPONINI, MYOGLOBIN in the last 168 hours.  Invalid input(s): CK ------------------------------------------------------------------------------------------------------------------ No results found for:  BNP  CBG: No results for input(s): GLUCAP in the last 168 hours.  Recent Results (from the past 240 hour(s))  Resp Panel by RT-PCR (Flu A&B, Covid) Nasopharyngeal Swab     Status: None   Collection Time: 03/31/21  1:46 PM   Specimen: Nasopharyngeal Swab; Nasopharyngeal(NP) swabs in vial transport medium  Result Value Ref Range Status   SARS Coronavirus 2 by RT PCR NEGATIVE NEGATIVE Final    Comment: (NOTE) SARS-CoV-2 target nucleic acids are NOT DETECTED.  The SARS-CoV-2 RNA is generally detectable in upper respiratory specimens during the acute phase of infection. The lowest concentration of SARS-CoV-2 viral copies this assay can detect is 138 copies/mL. A negative result does not preclude SARS-Cov-2 infection and should not be used as the sole basis for treatment or other patient management decisions. A negative result may occur with  improper specimen collection/handling, submission of specimen other than nasopharyngeal swab, presence of viral mutation(s) within the areas targeted by this assay, and inadequate number of viral copies(<138 copies/mL). A negative result must be  combined with clinical observations, patient history, and epidemiological information. The expected result is Negative.  Fact Sheet for Patients:  EntrepreneurPulse.com.au  Fact Sheet for Healthcare Providers:  IncredibleEmployment.be  This test is no t yet approved or cleared by the [place] FDA and  has been authorized for detection and/or diagnosis of SARS-CoV-2 by FDA under an Emergency Use Authorization (EUA). This EUA will remain  in effect (meaning this test can be used) for the duration of the COVID-19 declaration under Section 564(b)(1) of the Act, 21 U.S.C.section 360bbb-3(b)(1), unless the authorization is terminated  or revoked sooner.       Influenza A by PCR NEGATIVE NEGATIVE Final   Influenza B by PCR NEGATIVE NEGATIVE Final    Comment: (NOTE) The Xpert Xpress SARS-CoV-2/FLU/RSV plus assay is intended as an aid in the diagnosis of influenza from Nasopharyngeal swab specimens and should not be used as a sole basis for treatment. Nasal washings and aspirates are unacceptable for Xpert Xpress SARS-CoV-2/FLU/RSV testing.  Fact Sheet for Patients: EntrepreneurPulse.com.au  Fact Sheet for Healthcare Providers: IncredibleEmployment.be  This test is not yet approved or cleared by the [place] FDA and has been authorized for detection and/or diagnosis of SARS-CoV-2 by FDA under an Emergency Use Authorization (EUA). This EUA will remain in effect (meaning this test can be used) for the duration of the COVID-19 declaration under Section 564(b)(1) of the Act, 21 U.S.C. section 360bbb-3(b)(1), unless the authorization is terminated or revoked.  Performed at [facility], [place] [address], [place], [place] [address]      Radiology Studies: CT ANKLE RIGHT W CONTRAST  Result Date: 03/31/2021 CLINICAL DATA:  Foot swelling and diabetes. Osteomyelitis. Soft tissue mass. EXAM: CT OF THE  RIGHT ANKLE WITH CONTRAST TECHNIQUE: Multidetector CT imaging of the right ankle was performed following the standard protocol during bolus administration of intravenous contrast. RADIATION DOSE REDUCTION: This exam was performed according to the departmental dose-optimization program which includes automated exposure control, adjustment of the mA and/or kV according to patient size and/or use of iterative reconstruction technique. CONTRAST:  31mL OMNIPAQUE IOHEXOL 300 MG/ML  SOLN COMPARISON:  03/31/2021 radiograph FINDINGS: Bones/Joint/Cartilage No bony destructive findings characteristic of osteomyelitis. Plantar calcaneal spur. Moderate spurring and degenerative subcortical cyst formation along the posterior subtalar joint. Moderate marginal spurring at the tibiotalar articulation. Mild tibiotalar valgus angulation and mild degenerative midfoot spurring and loss of articular space. Ligaments Suboptimally assessed by CT. Muscles  and Tendons Spurring along the distal tibia may be somewhat impinging on the flexor digitorum longus tendon for example on image 37 series 2. Reduced size and indistinctness of the tibialis posterior tendon, potentially from partial tearing or tendinopathy. Soft tissues Circumferential subcutaneous edema in the ankle. No appreciable gas in the soft tissues the ankle. No drainable abscess in the ankle region identified. No substantial joint effusion. IMPRESSION: 1. No findings of osteomyelitis or septic joint involving the ankle. 2. Notable degenerative findings in the hindfoot and midfoot. 3. Reduced size and indistinctness of the tibialis posterior tendon which could reflect partial tearing or tendinopathy. 4. Mild tibiotalar valgus angulation. 5. Posterior spurring from the distal tibia may be somewhat impinging on the flexor digitorum longus tendon. 6. Circumferential infiltrative subcutaneous edema in the ankle. Electronically Signed   By: Van Clines M.D.   On: 03/31/2021 18:58    CT FOOT RIGHT W CONTRAST  Result Date: 03/31/2021 CLINICAL DATA:  Foot swelling.  Diabetes.  Possible osteomyelitis. EXAM: CT OF THE LOWER RIGHT EXTREMITY WITH CONTRAST TECHNIQUE: Multidetector CT imaging of the lower right extremity was performed according to the standard protocol following intravenous contrast administration. RADIATION DOSE REDUCTION: This exam was performed according to the departmental dose-optimization program which includes automated exposure control, adjustment of the mA and/or kV according to patient size and/or use of iterative reconstruction technique. CONTRAST:  29mL OMNIPAQUE IOHEXOL 300 MG/ML  SOLN COMPARISON:  Foot radiographs 03/31/2021 FINDINGS: Bones/Joint/Cartilage Notable degenerative findings in the hindfoot and midfoot as described in ankle CT report. No bony destructive findings characteristic of osteomyelitis. No fracture is identified. Mild degenerative findings along the Lisfranc joint and at the first MTP joint. Likewise mild degenerative findings at the articulation of first metatarsal head in the sesamoids. Ligaments Suboptimally assessed by CT. Muscles and Tendons Unremarkable Soft tissues Substantial subcutaneous edema tracks in the dorsum of the foot and cellulitis is not excluded. I do not see a drainable abscess. Medially and along the plantar side of the great toe there is substantial focal subcutaneous edema and enhancement favoring cellulitis. Cutaneous irregularity with a small amount of gas along an ulceration in the medial great toe is noted, corresponding to the appearance on radiography. I do not see definite gas in the interphalangeal joint or a definite interphalangeal joint effusion although tissue planes are effaced in the vicinity of the ulceration. I do not see a definite drainable abscess. IMPRESSION: 1. Substantial medial ulceration of the great toe with enhancement in the surrounding subcutaneous tissues indicating cellulitis. No bony  destructive findings to indicate osteomyelitis. No drainable abscess identified. 2. Dorsal subcutaneous edema along the forefoot, cellulitis is not excluded. 3. Degenerative findings along the first MTP joint. Electronically Signed   By: Van Clines M.D.   On: 03/31/2021 19:02   DG Foot Complete Right  Result Date: 03/31/2021 CLINICAL DATA:  Swelling and redness EXAM: RIGHT FOOT COMPLETE - 3+ VIEW COMPARISON:  Right foot radiographs 05/12/2020 FINDINGS: There is no acute fracture or dislocation. Deformity of the second metatarsal is suggestive of prior trauma, unchanged. Bony alignment is normal. The Lisfranc and Chopart joints are intact. There is no erosive change. The joint spaces are preserved. There is inferior calcaneal spurring. There is a soft tissue defect along the lateral aspect of the great toe adjacent to the interphalangeal joint. Small locules of lucency may reflect soft tissue gas. There is no destruction of the underlying bone. There is diffuse soft tissue swelling particularly over the dorsum of the  foot. Vascular calcifications are noted. IMPRESSION: Soft tissue defect along the lateral aspect of the great toe adjacent to the interphalangeal joint with small locules of lucency which may reflect soft tissue gas. Infection with a gas-forming organism is not excluded. There is no underlying osseous erosion or destruction to suggest definite osteomyelitis. Electronically Signed   By: Valetta Mole M.D.   On: 03/31/2021 14:40   ECHOCARDIOGRAM COMPLETE  Result Date: 04/01/2021    ECHOCARDIOGRAM REPORT   Patient Name:   CHRISTIN JONES Date of Exam: 04/01/2021 Medical Rec #:  [id number]     Height:       70.0 in Accession #:    [id number]    Weight:       185.0 lb Date of Birth:  [birth year]      BSA:          2.019 m Patient Age:    89 years      BP:           158/89 mmHg Patient Gender: M             HR:           66 bpm. Exam Location:  Inpatient Procedure: 2D Echo, Cardiac Doppler, Color  Doppler and Intracardiac            Opacification Agent Indications:    R94.31 Abnormal EKG  History:        Patient has no prior history of Echocardiogram examinations.                 CHF, CAD and Previous Myocardial Infarction, Arrythmias:RBBB and                 Atrial Flutter; Risk Factors:Hypertension.  Sonographer:    Glo Herring Referring Phys: [place]  Sonographer Comments: Suboptimal apical window. IMPRESSIONS  1. Global mild to moderate hypokinesis with akinesis of the anterior, anteroseptal wall and apex c/w LAD disease. Left ventricular ejection fraction, by estimation, is 40 to 45%. The left ventricle has mildly decreased function. The left ventricle demonstrates regional wall motion abnormalities (see scoring diagram/findings for description). There is mild concentric left ventricular hypertrophy. Left ventricular diastolic parameters are indeterminate.  2. Right ventricular systolic function was not well visualized. The right ventricular size is not well visualized.  3. Left atrial size was moderately dilated.  4. Right atrium is dilated.  5. The mitral valve is grossly normal. No evidence of mitral valve regurgitation.  6. The aortic valve is grossly normal. Aortic valve regurgitation is mild. Aortic valve sclerosis/calcification is present, without any evidence of aortic stenosis.  7. The inferior vena cava not well visualized. Comparison(s): No prior Echocardiogram. FINDINGS  Left Ventricle: Global mild to moderate hypokinesis with akinesis of the anterior, anteroseptal wall and apex c/w LAD disease. Left ventricular ejection fraction, by estimation, is 40 to 45%. The left ventricle has mildly decreased function. The left ventricle demonstrates regional wall motion abnormalities. Definity contrast agent was given IV to delineate the left ventricular endocardial borders. The left ventricular internal cavity size was normal in size. There is mild concentric left ventricular   hypertrophy. Left ventricular diastolic parameters are indeterminate. Right Ventricle: The right ventricular size is not well visualized. Right vetricular wall thickness was not well visualized. Right ventricular systolic function was not well visualized. Left Atrium: Left atrial size was moderately dilated. Right Atrium: Right atrium is dilated. Pericardium: There is no evidence of pericardial effusion. Mitral Valve: The mitral  valve is grossly normal. No evidence of mitral valve regurgitation. MV peak gradient, 2.9 mmHg. The mean mitral valve gradient is 1.0 mmHg. Tricuspid Valve: The tricuspid valve is grossly normal. Tricuspid valve regurgitation is trivial. Aortic Valve: The aortic valve is grossly normal. Aortic valve regurgitation is mild. Aortic valve sclerosis/calcification is present, without any evidence of aortic stenosis. Aortic valve mean gradient measures 4.0 mmHg. Aortic valve peak gradient measures 7.5 mmHg. Aortic valve area, by VTI measures 2.32 cm. Pulmonic Valve: The pulmonic valve was grossly normal. Pulmonic valve regurgitation is not visualized. Aorta: The aortic root and ascending aorta are structurally normal, with no evidence of dilitation. Venous: The inferior vena cava not well visualized. IAS/Shunts: The interatrial septum was not well visualized.  LEFT VENTRICLE PLAX 2D LVIDd:         4.50 cm      Diastology LVIDs:         3.40 cm      LV e' medial:    4.13 cm/s LV PW:         1.10 cm      LV E/e' medial:  18.5 LV IVS:        1.10 cm      LV e' lateral:   6.45 cm/s LVOT diam:     2.00 cm      LV E/e' lateral: 11.8 LV SV:         57 LV SV Index:   28 LVOT Area:     3.14 cm  LV Volumes (MOD) LV vol d, MOD A2C: 156.0 ml LV vol d, MOD A4C: 164.0 ml LV vol s, MOD A2C: 76.4 ml LV vol s, MOD A4C: 91.1 ml LV SV MOD A2C:     79.6 ml LV SV MOD A4C:     164.0 ml LV SV MOD BP:      76.3 ml RIGHT VENTRICLE RV S prime:     7.65 cm/s LEFT ATRIUM             Index LA diam:        5.10 cm 2.53 cm/m  LA Vol (A2C):   64.0 ml 31.69 ml/m LA Vol (A4C):   73.6 ml 36.45 ml/m LA Biplane Vol: 72.5 ml 35.90 ml/m  AORTIC VALVE                    PULMONIC VALVE AV Area (Vmax):    2.19 cm     PV Vmax:       1.00 m/s AV Area (Vmean):   2.19 cm     PV Peak grad:  4.0 mmHg AV Area (VTI):     2.32 cm AV Vmax:           137.00 cm/s AV Vmean:          89.900 cm/s AV VTI:            0.246 m AV Peak Grad:      7.5 mmHg AV Mean Grad:      4.0 mmHg LVOT Vmax:         95.30 cm/s LVOT Vmean:        62.700 cm/s LVOT VTI:          0.182 m LVOT/AV VTI ratio: 0.74  AORTA Ao Root diam: 3.50 cm Ao Asc diam:  3.40 cm MITRAL VALVE               TRICUSPID VALVE MV Area (PHT): 4.31 cm  TR Peak grad:   30.9 mmHg MV Area VTI:   2.13 cm    TR Vmax:        278.00 cm/s MV Peak grad:  2.9 mmHg MV Mean grad:  1.0 mmHg    SHUNTS MV Vmax:       0.85 m/s    Systemic VTI:  0.18 m MV Vmean:      48.0 cm/s   Systemic Diam: 2.00 cm MV Decel Time: 176 msec MV E velocity: 76.30 cm/s MV A velocity: 73.70 cm/s MV E/A ratio:  1.04 Landscape architect signed by Phineas Inches Signature Date/Time: 04/01/2021/10:20:26 AM    Final      Marzetta Board, MD, PhD [facility]  Between 7 am - 7 pm I am available, please contact me via Amion (for emergencies) or Securechat (non urgent messages)  Between 7 pm - 7 am I am not available, please contact night coverage MD/APP via Amion

## 2021-04-01 NOTE — Hospital Course (Addendum)
88 y.o. male with medical history significant of coronary artery disease, ischemic cardiomyopathy with chronic systolic CHF with prior EF of 93%, history of CABG in 2006, a flutter status post ablation in 2014, VT, also sick sinus syndrome status post pacemaker.  He has been dealing with a right lower extremity callus for several weeks, seen as an outpatient by podiatry.  Over the last 3 to 4 days, he has noticed increased pain to the right lower extremity, increased swelling and redness.  He has a degree of neuropathy and does not feel much.  He was seen in podiatrist office and was sent to the hospital.  Vascular surgery as well as podiatry consulted, arteriogram shows significant right lower extremity arterial disease now being considered for bypass surgery

## 2021-04-02 DIAGNOSIS — I5022 Chronic systolic (congestive) heart failure: Secondary | ICD-10-CM

## 2021-04-02 DIAGNOSIS — L02619 Cutaneous abscess of unspecified foot: Secondary | ICD-10-CM

## 2021-04-02 DIAGNOSIS — L03119 Cellulitis of unspecified part of limb: Secondary | ICD-10-CM | POA: Diagnosis not present

## 2021-04-02 LAB — CBC
HCT: 42.4 % (ref 39.0–52.0)
Hemoglobin: 14.2 g/dL (ref 13.0–17.0)
MCH: 29.9 pg (ref 26.0–34.0)
MCHC: 33.5 g/dL (ref 30.0–36.0)
MCV: 89.3 fL (ref 80.0–100.0)
Platelets: 158 10*3/uL (ref 150–400)
RBC: 4.75 MIL/uL (ref 4.22–5.81)
RDW: 11.5 % (ref 11.5–15.5)
WBC: 6.3 10*3/uL (ref 4.0–10.5)
nRBC: 0 % (ref 0.0–0.2)

## 2021-04-02 LAB — COMPREHENSIVE METABOLIC PANEL
ALT: 17 U/L (ref 0–44)
AST: 27 U/L (ref 15–41)
Albumin: 3 g/dL — ABNORMAL LOW (ref 3.5–5.0)
Alkaline Phosphatase: 59 U/L (ref 38–126)
Anion gap: 6 (ref 5–15)
BUN: 14 mg/dL (ref 8–23)
CO2: 23 mmol/L (ref 22–32)
Calcium: 8.6 mg/dL — ABNORMAL LOW (ref 8.9–10.3)
Chloride: 107 mmol/L (ref 98–111)
Creatinine, Ser: 0.88 mg/dL (ref 0.61–1.24)
GFR, Estimated: >60 mL/min (ref >60)
Glucose, Bld: 121 mg/dL — ABNORMAL HIGH (ref 70–99)
Potassium: 3.8 mmol/L (ref 3.5–5.1)
Sodium: 136 mmol/L (ref 135–145)
Total Bilirubin: 0.6 mg/dL (ref 0.3–1.2)
Total Protein: 5.9 g/dL — ABNORMAL LOW (ref 6.5–8.1)

## 2021-04-02 LAB — MAGNESIUM: Magnesium: 2.1 mg/dL (ref 1.7–2.4)

## 2021-04-02 MED ORDER — PANTOPRAZOLE SODIUM 40 MG PO TBEC
40.0000 mg | DELAYED_RELEASE_TABLET | Freq: Every day | ORAL | Status: DC
  Administered 2021-04-02 – 2021-04-16 (×12): 40 mg via ORAL
  Filled 2021-04-02 (×13): qty 1

## 2021-04-02 NOTE — Progress Notes (Addendum)
PROGRESS NOTE  [name] [id number] DOB: 12-28-1933 DOA: 03/31/2021 PCP: Raina Mina., MD   LOS: 1 day   Brief Narrative / Interim history:  88 y.o. male with medical history significant of coronary artery disease, ischemic cardiomyopathy with chronic systolic CHF with prior EF of 25%, history of CABG in 2006, a flutter status post ablation in 2014, VT, also sick sinus syndrome status post pacemaker.  He has been dealing with a right lower extremity callus for several weeks, seen as an outpatient by podiatry.  Over the last 3 to 4 days, he has noticed increased pain to the right lower extremity, increased swelling and redness.  He has a degree of neuropathy and does not feel much.  He was seen in podiatrist office today, was felt to have significant worsening, cellulitis, and was sent to the emergency room.  Subjective / 24h Interval events: -Minimal midfoot pain, no significant complaints otherwise  Assessment & Plan: * Cellulitis and abscess of foot- (present on admission) - Patient admitted with right foot cellulitis and abscess.  He has pus draining from right greater toe.  Cellulitis extends up to mid upper shins.  Placed on antibiotics.  There is concern for gas-forming organisms and add clindamycin along to Vanco and Zosyn.  Blood cultures pending.  Vascular surgery consulted, with plans for arteriogram on Monday and meanwhile continue antibiotics.  Patient has a pacemaker, unable to obtain an MRI.  Podiatry consulted as well, ordered a bone scan to see whether there is any concern for osteomyelitis.  Will need some debridement at least at bedside after vascular intervention  Chronic systolic CHF (congestive heart failure) [facility]) -Last seen cardiology at [facility], Dr. Ola Spurr, in 2018.  It was mentioned that the EF at that time was 40 to 45%, and recently had a stress test which was negative.  2D echo done here shows EF 40-45% with mildly decreased LV function.  Suspect unchanged.  He  has no heart failure symptoms, no angina.  Currently he is not on goal-directed therapy, not clear when he stopped the medications but back in 2018 he was on metoprolol 12.5 twice daily as well as aspirin.   CAD (coronary artery disease) with history of CABG -noted, no chest pain or apparent active issues  Urinary hesitancy - Possibly due to BPH.  This is been going on for quite some time, but most recently has noticed that he needs to urinate more often during the nighttime.  He is supposed to see urology next week.  Continue Flomax, apparently no further issues  PAD (peripheral artery disease) ([place]) -vascular consulted.  Arteriogram on Monday  Sick sinus syndrome ([place]) status post pacemaker- (present on admission) - Keep on medical telemetry  Dizziness -Chronic   Essential hypertension- (present on admission) -Monitor blood pressure, currently not taking any medications    Scheduled Meds:  enoxaparin (LOVENOX) injection  40 mg Subcutaneous Q24H   tamsulosin  0.4 mg Oral QPC supper   Continuous Infusions:  clindamycin (CLEOCIN) IV 600 mg (04/02/21 0510)   piperacillin-tazobactam (ZOSYN)  IV 3.375 g (04/02/21 0944)   vancomycin 1,750 mg (04/01/21 1814)   PRN Meds:.acetaminophen, alum & mag hydroxide-simeth, bisacodyl, oxyCODONE  Diet Orders (From admission, onward)     Start     Ordered   03/31/21 1758  Diet regular Room service appropriate? Yes; Fluid consistency: Thin  Diet effective now       Question Answer Comment  Room service appropriate? Yes   Fluid consistency: Thin  03/31/21 1757            DVT prophylaxis: enoxaparin (LOVENOX) injection 40 mg Start: 03/31/21 2000   Lab Results  Component Value Date   PLT 158 04/02/2021      Code Status: Full Code  Family Communication: Verlin Dike, daughter [phone]  Status is: Inpatient  Level of care: Progressive  Consultants:  Vascular Podiatry   Procedures:  2D echo  Microbiology  Blood  cultures - pending  Antimicrobials: Vancomycin / Zosyn / Clindamycin    Objective: Vitals:   04/01/21 2300 04/02/21 0425 04/02/21 0739 04/02/21 1115  BP: 137/86 135/88 136/71 (!) 148/82  Pulse: 69 69 67 70  Resp: 18 16 14 16   Temp: 98 F (36.7 C) 98.5 F (36.9 C) 98.2 F (36.8 C) 98.2 F (36.8 C)  TempSrc: Oral Oral Oral Oral  SpO2:   97% 99%  Weight:      Height:        Intake/Output Summary (Last 24 hours) at 04/02/2021 1431 Last data filed at 04/02/2021 1230 Gross per 24 hour  Intake 580 ml  Output 450 ml  Net 130 ml    Wt Readings from Last 3 Encounters:  03/31/21 83.9 kg  12/27/20 87.2 kg  07/08/19 88.2 kg    Examination:  Constitutional: NAD Eyes: lids and conjunctivae normal, no scleral icterus ENMT: mmm Neck: normal, supple Respiratory: clear to auscultation bilaterally, no wheezing, no crackles. Normal respiratory effort.  Cardiovascular: Regular rate and rhythm, no murmurs / rubs / gallops. No LE edema. Abdomen: soft, no distention, no tenderness. Bowel sounds positive.  Skin: no new rashes Neurologic: no focal deficits, equal strength   Data Reviewed: I have independently reviewed following labs and imaging studies  CBC Recent Labs  Lab 03/31/21 1351 04/01/21 0314 04/02/21 0224  WBC 8.3 7.3 6.3  HGB 14.7 14.8 14.2  HCT 43.9 44.9 42.4  PLT 177 172 158  MCV 90.9 91.6 89.3  MCH 30.4 30.2 29.9  MCHC 33.5 33.0 33.5  RDW 11.6 11.7 11.5  LYMPHSABS 0.9  --   --   MONOABS 0.4  --   --   EOSABS 0.0  --   --   BASOSABS 0.0  --   --      Recent Labs  Lab 03/31/21 1351 03/31/21 1543 04/01/21 0314 04/02/21 0224  NA 141  --  140 136  K 3.8  --  4.0 3.8  CL 103  --  105 107  CO2 25  --  25 23  GLUCOSE 137*  --  98 121*  BUN 18  --  15 14  CREATININE 0.81  --  0.97 0.88  CALCIUM 9.3  --  9.0 8.6*  AST  --   --  25 27  ALT  --   --  18 17  ALKPHOS  --   --  69 59  BILITOT  --   --  0.8 0.6  ALBUMIN  --   --  3.2* 3.0*  MG  --   --    --  2.1  LATICACIDVEN  --  1.5  --   --   HGBA1C  --   --  5.5  --      ------------------------------------------------------------------------------------------------------------------ No results for input(s): CHOL, HDL, LDLCALC, TRIG, CHOLHDL, LDLDIRECT in the last 72 hours.  Lab Results  Component Value Date   HGBA1C 5.5 04/01/2021   ------------------------------------------------------------------------------------------------------------------ No results for input(s): TSH, T4TOTAL, T3FREE, THYROIDAB in the last 72 hours.  Invalid input(s): FREET3  Cardiac Enzymes No results for input(s): CKMB, TROPONINI, MYOGLOBIN in the last 168 hours.  Invalid input(s): CK ------------------------------------------------------------------------------------------------------------------ No results found for: BNP  CBG: No results for input(s): GLUCAP in the last 168 hours.  Recent Results (from the past 240 hour(s))  Resp Panel by RT-PCR (Flu A&B, Covid) Nasopharyngeal Swab     Status: None   Collection Time: 03/31/21  1:46 PM   Specimen: Nasopharyngeal Swab; Nasopharyngeal(NP) swabs in vial transport medium  Result Value Ref Range Status   SARS Coronavirus 2 by RT PCR NEGATIVE NEGATIVE Final    Comment: (NOTE) SARS-CoV-2 target nucleic acids are NOT DETECTED.  The SARS-CoV-2 RNA is generally detectable in upper respiratory specimens during the acute phase of infection. The lowest concentration of SARS-CoV-2 viral copies this assay can detect is 138 copies/mL. A negative result does not preclude SARS-Cov-2 infection and should not be used as the sole basis for treatment or other patient management decisions. A negative result may occur with  improper specimen collection/handling, submission of specimen other than nasopharyngeal swab, presence of viral mutation(s) within the areas targeted by this assay, and inadequate number of viral copies(<138 copies/mL). A negative result must  be combined with clinical observations, patient history, and epidemiological information. The expected result is Negative.  Fact Sheet for Patients:  EntrepreneurPulse.com.au  Fact Sheet for Healthcare Providers:  IncredibleEmployment.be  This test is no t yet approved or cleared by the [place] FDA and  has been authorized for detection and/or diagnosis of SARS-CoV-2 by FDA under an Emergency Use Authorization (EUA). This EUA will remain  in effect (meaning this test can be used) for the duration of the COVID-19 declaration under Section 564(b)(1) of the Act, 21 U.S.C.section 360bbb-3(b)(1), unless the authorization is terminated  or revoked sooner.       Influenza A by PCR NEGATIVE NEGATIVE Final   Influenza B by PCR NEGATIVE NEGATIVE Final    Comment: (NOTE) The Xpert Xpress SARS-CoV-2/FLU/RSV plus assay is intended as an aid in the diagnosis of influenza from Nasopharyngeal swab specimens and should not be used as a sole basis for treatment. Nasal washings and aspirates are unacceptable for Xpert Xpress SARS-CoV-2/FLU/RSV testing.  Fact Sheet for Patients: EntrepreneurPulse.com.au  Fact Sheet for Healthcare Providers: IncredibleEmployment.be  This test is not yet approved or cleared by the [place] FDA and has been authorized for detection and/or diagnosis of SARS-CoV-2 by FDA under an Emergency Use Authorization (EUA). This EUA will remain in effect (meaning this test can be used) for the duration of the COVID-19 declaration under Section 564(b)(1) of the Act, 21 U.S.C. section 360bbb-3(b)(1), unless the authorization is terminated or revoked.  Performed at [facility], [place] [address] [place], [place] [address]   Culture, blood (routine x 2)     Status: None (Preliminary result)   Collection Time: 03/31/21  3:43 PM   Specimen: BLOOD LEFT FOREARM  Result Value Ref Range Status    Specimen Description BLOOD LEFT FOREARM  Final   Special Requests   Final    BOTTLES DRAWN AEROBIC AND ANAEROBIC Blood Culture results may not be optimal due to an inadequate volume of blood received in culture bottles   Culture   Final    NO GROWTH 2 DAYS Performed at [facility], [place] [address] [place], [place] [address]    Report Status PENDING  Incomplete      Radiology Studies: No results found.   Marzetta Board, MD,  PhD [facility]  Between 7 am - 7 pm I am available, please contact me via Amion (for emergencies) or Securechat (non urgent messages)  Between 7 pm - 7 am I am not available, please contact night coverage MD/APP via Amion

## 2021-04-02 NOTE — Consult Note (Signed)
Reason for Consult: Ulcer right hallux and peripheral arterial disease Referring Physician: Dr. [name] is an 88 y.o. male.  HPI: 88 year old male known to my partner in the outpatient setting, developed a wound on the right hallux.  Outpatient ABI showed ABI of 0.29 on the right side.  Vascular surgery has been consulted and he is scheduled for angiography on Monday 1/23.  He says is quite painful.  Continues to throb on and off  Past Medical History:  Diagnosis Date   Abnormal myocardial perfusion study 09/15/2016   Anticoagulant long-term use 05/05/2015   Aortic atherosclerosis ([place]) 02/16/2016   BMI 31.0-31.9,adult 05/05/2015   CAD in native artery 02/14/2015   Chronic rhinitis 05/05/2015   Coronary artery abnormality 02/14/2015   Coronary artery disease involving coronary bypass graft of native heart without angina pectoris 05/05/2015   Coronary artery disease involving native coronary artery of native heart with angina pectoris ([place]) 05/05/2015   Dizziness 10/05/2016   Elevated PSA 05/05/2015   [name]   Essential hypertension 02/14/2015   GERD (gastroesophageal reflux disease) 05/05/2015   High risk medication use 05/05/2015   History of cardiac radiofrequency ablation 09/15/2016   History of small bowel obstruction 03/05/2017   Hx of CABG 09/15/2016   Hyperlipidemia 02/14/2015   Impacted cerumen, bilateral 04/24/2018   Lumbar degenerative disc disease 05/05/2015   Malaise and fatigue 05/05/2015   Last Assessment & Plan:  Relevant Hx: Course: Daily Update: Today's Plan:he admits that this can be prominent for him and he had UA checked to ensure not having infection which he was concerned about and reassured him of this, encouraged him to remain active and his meds contribute to this as well which he is aware of   Electronically signed by: Billy Camel, NP 08/12/15 1303   Old MI (myocardial infarction) 09/15/2016   PAF (paroxysmal atrial fibrillation) ([place]) 12/31/2017    Telemetry detected   Partial small bowel obstruction ([place]) 03/05/2017   Prediabetes 05/05/2015   Preoperative cardiovascular examination 02/14/2015   Presence of cardiac pacemaker 02/14/2015   For symptomatic sinus bradycardia after flutter ablation For symptomatic sinus bradycardia after flutter ablation   Primary osteoarthritis involving multiple joints 05/05/2015   Last Assessment & Plan:  Relevant Hx: Course: Daily Update: Today's Plan:he has known OA to his joints and he has seen chiropractor in the past for this and he states that he was told he had degenerative changes to his back several times. The knee needs to be xrayed to the right with the swelling , he is taking the oxycodone only as needed but it works well he does not feel dizzy whereas the aleve   RBBB with left anterior fascicular block 02/14/2015   Recurrent incisional hernia 03/05/2017   Sick sinus syndrome ([place]) 02/17/2015   SSS (sick sinus syndrome) ([place]) 02/17/2015   Thrombocytopenia due to drugs 07/16/2017   Typical atrial flutter ([place]) 05/05/2015   Last Assessment & Plan:  Warfarin was stopped September 07, 2016 by physicians assistant or [place] cardiology after consistent monitoring showed no recurrence of A. fib for more than 1-1/2 years   Ventral hernia without obstruction or gangrene 05/05/2015    Past Surgical History:  Procedure Laterality Date   ATRIAL FLUTTER ABLATION     COLON SURGERY     CORONARY ARTERY BYPASS GRAFT     INSERT / REPLACE / REMOVE PACEMAKER      Family History  Problem Relation Age of Onset   Diabetes  Sister    Hyperlipidemia Daughter    Hypertension Daughter    Diabetes Daughter    Rheum arthritis Daughter    Hypertension Daughter    AAA (abdominal aortic aneurysm) Daughter    Migraines Daughter    Heart attack Neg Hx    Heart disease Neg Hx    Heart failure Neg Hx     Social History:  reports that he has never smoked. He has never used smokeless tobacco. He reports that he does not drink  alcohol and does not use drugs.  Allergies:  Allergies  Allergen Reactions   Amiodarone Nausea And Vomiting    vomiting    Gabapentin     dizziness     Metoprolol Other (See Comments)    Drop in heart rate, dizziness.     Statins     myalgias   Doxycycline     Unknown to patient    Neomycin     Unknown to patient     Niacin     Unknown to patient    Ramipril     Unknown to patient      Medications: I have reviewed the patient's current medications.  Results for orders placed or performed during the hospital encounter of 03/31/21 (from the past 48 hour(s))  Resp Panel by RT-PCR (Flu A&B, Covid) Nasopharyngeal Swab     Status: None   Collection Time: 03/31/21  1:46 PM   Specimen: Nasopharyngeal Swab; Nasopharyngeal(NP) swabs in vial transport medium  Result Value Ref Range   SARS Coronavirus 2 by RT PCR NEGATIVE NEGATIVE    Comment: (NOTE) SARS-CoV-2 target nucleic acids are NOT DETECTED.  The SARS-CoV-2 RNA is generally detectable in upper respiratory specimens during the acute phase of infection. The lowest concentration of SARS-CoV-2 viral copies this assay can detect is 138 copies/mL. A negative result does not preclude SARS-Cov-2 infection and should not be used as the sole basis for treatment or other patient management decisions. A negative result may occur with  improper specimen collection/handling, submission of specimen other than nasopharyngeal swab, presence of viral mutation(s) within the areas targeted by this assay, and inadequate number of viral copies(<138 copies/mL). A negative result must be combined with clinical observations, patient history, and epidemiological information. The expected result is Negative.  Fact Sheet for Patients:  EntrepreneurPulse.com.au  Fact Sheet for Healthcare Providers:  IncredibleEmployment.be  This test is no t yet approved or cleared by the [place] FDA and  has  been authorized for detection and/or diagnosis of SARS-CoV-2 by FDA under an Emergency Use Authorization (EUA). This EUA will remain  in effect (meaning this test can be used) for the duration of the COVID-19 declaration under Section 564(b)(1) of the Act, 21 U.S.C.section 360bbb-3(b)(1), unless the authorization is terminated  or revoked sooner.       Influenza A by PCR NEGATIVE NEGATIVE   Influenza B by PCR NEGATIVE NEGATIVE    Comment: (NOTE) The Xpert Xpress SARS-CoV-2/FLU/RSV plus assay is intended as an aid in the diagnosis of influenza from Nasopharyngeal swab specimens and should not be used as a sole basis for treatment. Nasal washings and aspirates are unacceptable for Xpert Xpress SARS-CoV-2/FLU/RSV testing.  Fact Sheet for Patients: EntrepreneurPulse.com.au  Fact Sheet for Healthcare Providers: IncredibleEmployment.be  This test is not yet approved or cleared by the [place] FDA and has been authorized for detection and/or diagnosis of SARS-CoV-2 by FDA under an Emergency Use Authorization (EUA). This EUA will remain in effect (meaning this  test can be used) for the duration of the COVID-19 declaration under Section 564(b)(1) of the Act, 21 U.S.C. section 360bbb-3(b)(1), unless the authorization is terminated or revoked.  Performed at [facility], [place] [address] [place], [place] [id number]   Basic metabolic panel     Status: Abnormal   Collection Time: 03/31/21  1:51 PM  Result Value Ref Range   Sodium 141 135 - 145 mmol/L   Potassium 3.8 3.5 - 5.1 mmol/L   Chloride 103 98 - 111 mmol/L   CO2 25 22 - 32 mmol/L   Glucose, Bld 137 (H) 70 - 99 mg/dL    Comment: Glucose reference range applies only to samples taken after fasting for at least 8 hours.   BUN 18 8 - 23 mg/dL   Creatinine, Ser 0.81 0.61 - 1.24 mg/dL   Calcium 9.3 8.9 - 10.3 mg/dL   GFR, Estimated >60 >60 mL/min    Comment: (NOTE) Calculated using the  CKD-EPI Creatinine Equation (2021)    Anion gap 13 5 - 15    Comment: Performed at [facility] [address] [place], [place] [address]  CBC with Differential     Status: None   Collection Time: 03/31/21  1:51 PM  Result Value Ref Range   WBC 8.3 4.0 - 10.5 K/uL   RBC 4.83 4.22 - 5.81 MIL/uL   Hemoglobin 14.7 13.0 - 17.0 g/dL   HCT 43.9 39.0 - 52.0 %   MCV 90.9 80.0 - 100.0 fL   MCH 30.4 26.0 - 34.0 pg   MCHC 33.5 30.0 - 36.0 g/dL   RDW 11.6 11.5 - 15.5 %   Platelets 177 150 - 400 K/uL   nRBC 0.0 0.0 - 0.2 %   Neutrophils Relative % 85 %   Neutro Abs 7.0 1.7 - 7.7 K/uL   Lymphocytes Relative 10 %   Lymphs Abs 0.9 0.7 - 4.0 K/uL   Monocytes Relative 5 %   Monocytes Absolute 0.4 0.1 - 1.0 K/uL   Eosinophils Relative 0 %   Eosinophils Absolute 0.0 0.0 - 0.5 K/uL   Basophils Relative 0 %   Basophils Absolute 0.0 0.0 - 0.1 K/uL   Immature Granulocytes 0 %   Abs Immature Granulocytes 0.02 0.00 - 0.07 K/uL    Comment: Performed at [facility], [address], [place], [place] [address]  Culture, blood (routine x 2)     Status: None (Preliminary result)   Collection Time: 03/31/21  3:43 PM   Specimen: BLOOD LEFT FOREARM  Result Value Ref Range   Specimen Description BLOOD LEFT FOREARM    Special Requests      BOTTLES DRAWN AEROBIC AND ANAEROBIC Blood Culture results may not be optimal due to an inadequate volume of blood received in culture bottles   Culture      NO GROWTH 2 DAYS Performed at [facility], [place] [address], [place], [place] [address]    Report Status PENDING   Lactic acid, plasma     Status: None   Collection Time: 03/31/21  3:43 PM  Result Value Ref Range   Lactic Acid, Venous 1.5 0.5 - 1.9 mmol/L    Comment: Performed at [facility], [place] [address] [place], [place] [address]  Hemoglobin A1c     Status: None   Collection Time: 04/01/21  3:14 AM  Result Value Ref Range   Hgb A1c MFr Bld 5.5 4.8 - 5.6 %  Comment: (NOTE)          Prediabetes: 5.7 - 6.4         Diabetes: >6.4         Glycemic control for adults with diabetes: <7.0    Mean Plasma Glucose 111 mg/dL    Comment: (NOTE) Performed At: [facility] [place], [place] [id number] Rush Farmer MD [id number]   Comprehensive metabolic panel     Status: Abnormal   Collection Time: 04/01/21  3:14 AM  Result Value Ref Range   Sodium 140 135 - 145 mmol/L   Potassium 4.0 3.5 - 5.1 mmol/L   Chloride 105 98 - 111 mmol/L   CO2 25 22 - 32 mmol/L   Glucose, Bld 98 70 - 99 mg/dL    Comment: Glucose reference range applies only to samples taken after fasting for at least 8 hours.   BUN 15 8 - 23 mg/dL   Creatinine, Ser 0.97 0.61 - 1.24 mg/dL   Calcium 9.0 8.9 - 10.3 mg/dL   Total Protein 6.2 (L) 6.5 - 8.1 g/dL   Albumin 3.2 (L) 3.5 - 5.0 g/dL   AST 25 15 - 41 U/L   ALT 18 0 - 44 U/L   Alkaline Phosphatase 69 38 - 126 U/L   Total Bilirubin 0.8 0.3 - 1.2 mg/dL   GFR, Estimated >60 >60 mL/min    Comment: (NOTE) Calculated using the CKD-EPI Creatinine Equation (2021)    Anion gap 10 5 - 15    Comment: Performed at [facility], [place] [address], [place] [address]  CBC     Status: None   Collection Time: 04/01/21  3:14 AM  Result Value Ref Range   WBC 7.3 4.0 - 10.5 K/uL   RBC 4.90 4.22 - 5.81 MIL/uL   Hemoglobin 14.8 13.0 - 17.0 g/dL   HCT 44.9 39.0 - 52.0 %   MCV 91.6 80.0 - 100.0 fL   MCH 30.2 26.0 - 34.0 pg   MCHC 33.0 30.0 - 36.0 g/dL   RDW 11.7 11.5 - 15.5 %   Platelets 172 150 - 400 K/uL   nRBC 0.0 0.0 - 0.2 %    Comment: Performed at [facility], [place] [address] [place], [place] [address]  Comprehensive metabolic panel     Status: Abnormal   Collection Time: 04/02/21  2:24 AM  Result Value Ref Range   Sodium 136 135 - 145 mmol/L   Potassium 3.8 3.5 - 5.1 mmol/L   Chloride 107 98 - 111 mmol/L   CO2 23 22 - 32 mmol/L   Glucose, Bld 121 (H) 70 - 99 mg/dL    Comment: Glucose reference range applies  only to samples taken after fasting for at least 8 hours.   BUN 14 8 - 23 mg/dL   Creatinine, Ser 0.88 0.61 - 1.24 mg/dL   Calcium 8.6 (L) 8.9 - 10.3 mg/dL   Total Protein 5.9 (L) 6.5 - 8.1 g/dL   Albumin 3.0 (L) 3.5 - 5.0 g/dL   AST 27 15 - 41 U/L   ALT 17 0 - 44 U/L   Alkaline Phosphatase 59 38 - 126 U/L   Total Bilirubin 0.6 0.3 - 1.2 mg/dL   GFR, Estimated >60 >60 mL/min    Comment: (NOTE) Calculated using the CKD-EPI Creatinine Equation (2021)    Anion gap 6 5 - 15    Comment: Performed at [facility], [place],  [place] [address]  CBC     Status: None   Collection Time: 04/02/21  2:24 AM  Result Value Ref Range   WBC 6.3 4.0 - 10.5 K/uL   RBC 4.75 4.22 - 5.81 MIL/uL   Hemoglobin 14.2 13.0 - 17.0 g/dL   HCT 42.4 39.0 - 52.0 %   MCV 89.3 80.0 - 100.0 fL   MCH 29.9 26.0 - 34.0 pg   MCHC 33.5 30.0 - 36.0 g/dL   RDW 11.5 11.5 - 15.5 %   Platelets 158 150 - 400 K/uL   nRBC 0.0 0.0 - 0.2 %    Comment: Performed at [facility], [place] [address] [place], [place] [address]  Magnesium     Status: None   Collection Time: 04/02/21  2:24 AM  Result Value Ref Range   Magnesium 2.1 1.7 - 2.4 mg/dL    Comment: Performed at [facility], [place] [address] [place], [place] [address]    CT ANKLE RIGHT W CONTRAST  Result Date: 03/31/2021 CLINICAL DATA:  Foot swelling and diabetes. Osteomyelitis. Soft tissue mass. EXAM: CT OF THE RIGHT ANKLE WITH CONTRAST TECHNIQUE: Multidetector CT imaging of the right ankle was performed following the standard protocol during bolus administration of intravenous contrast. RADIATION DOSE REDUCTION: This exam was performed according to the departmental dose-optimization program which includes automated exposure control, adjustment of the mA and/or kV according to patient size and/or use of iterative reconstruction technique. CONTRAST:  18mL OMNIPAQUE IOHEXOL 300 MG/ML  SOLN COMPARISON:  03/31/2021 radiograph FINDINGS:  Bones/Joint/Cartilage No bony destructive findings characteristic of osteomyelitis. Plantar calcaneal spur. Moderate spurring and degenerative subcortical cyst formation along the posterior subtalar joint. Moderate marginal spurring at the tibiotalar articulation. Mild tibiotalar valgus angulation and mild degenerative midfoot spurring and loss of articular space. Ligaments Suboptimally assessed by CT. Muscles and Tendons Spurring along the distal tibia may be somewhat impinging on the flexor digitorum longus tendon for example on image 37 series 2. Reduced size and indistinctness of the tibialis posterior tendon, potentially from partial tearing or tendinopathy. Soft tissues Circumferential subcutaneous edema in the ankle. No appreciable gas in the soft tissues the ankle. No drainable abscess in the ankle region identified. No substantial joint effusion. IMPRESSION: 1. No findings of osteomyelitis or septic joint involving the ankle. 2. Notable degenerative findings in the hindfoot and midfoot. 3. Reduced size and indistinctness of the tibialis posterior tendon which could reflect partial tearing or tendinopathy. 4. Mild tibiotalar valgus angulation. 5. Posterior spurring from the distal tibia may be somewhat impinging on the flexor digitorum longus tendon. 6. Circumferential infiltrative subcutaneous edema in the ankle. Electronically Signed   By: Van Clines M.D.   On: 03/31/2021 18:58   CT FOOT RIGHT W CONTRAST  Result Date: 03/31/2021 CLINICAL DATA:  Foot swelling.  Diabetes.  Possible osteomyelitis. EXAM: CT OF THE LOWER RIGHT EXTREMITY WITH CONTRAST TECHNIQUE: Multidetector CT imaging of the lower right extremity was performed according to the standard protocol following intravenous contrast administration. RADIATION DOSE REDUCTION: This exam was performed according to the departmental dose-optimization program which includes automated exposure control, adjustment of the mA and/or kV according to  patient size and/or use of iterative reconstruction technique. CONTRAST:  25mL OMNIPAQUE IOHEXOL 300 MG/ML  SOLN COMPARISON:  Foot radiographs 03/31/2021 FINDINGS: Bones/Joint/Cartilage Notable degenerative findings in the hindfoot and midfoot as described in ankle CT report. No bony destructive findings characteristic of osteomyelitis. No fracture is identified. Mild degenerative findings along the Lisfranc joint and  at the first MTP joint. Likewise mild degenerative findings at the articulation of first metatarsal head in the sesamoids. Ligaments Suboptimally assessed by CT. Muscles and Tendons Unremarkable Soft tissues Substantial subcutaneous edema tracks in the dorsum of the foot and cellulitis is not excluded. I do not see a drainable abscess. Medially and along the plantar side of the great toe there is substantial focal subcutaneous edema and enhancement favoring cellulitis. Cutaneous irregularity with a small amount of gas along an ulceration in the medial great toe is noted, corresponding to the appearance on radiography. I do not see definite gas in the interphalangeal joint or a definite interphalangeal joint effusion although tissue planes are effaced in the vicinity of the ulceration. I do not see a definite drainable abscess. IMPRESSION: 1. Substantial medial ulceration of the great toe with enhancement in the surrounding subcutaneous tissues indicating cellulitis. No bony destructive findings to indicate osteomyelitis. No drainable abscess identified. 2. Dorsal subcutaneous edema along the forefoot, cellulitis is not excluded. 3. Degenerative findings along the first MTP joint. Electronically Signed   By: Van Clines M.D.   On: 03/31/2021 19:02   DG Foot Complete Right  Result Date: 03/31/2021 CLINICAL DATA:  Swelling and redness EXAM: RIGHT FOOT COMPLETE - 3+ VIEW COMPARISON:  Right foot radiographs 05/12/2020 FINDINGS: There is no acute fracture or dislocation. Deformity of the second  metatarsal is suggestive of prior trauma, unchanged. Bony alignment is normal. The Lisfranc and Chopart joints are intact. There is no erosive change. The joint spaces are preserved. There is inferior calcaneal spurring. There is a soft tissue defect along the lateral aspect of the great toe adjacent to the interphalangeal joint. Small locules of lucency may reflect soft tissue gas. There is no destruction of the underlying bone. There is diffuse soft tissue swelling particularly over the dorsum of the foot. Vascular calcifications are noted. IMPRESSION: Soft tissue defect along the lateral aspect of the great toe adjacent to the interphalangeal joint with small locules of lucency which may reflect soft tissue gas. Infection with a gas-forming organism is not excluded. There is no underlying osseous erosion or destruction to suggest definite osteomyelitis. Electronically Signed   By: Valetta Mole M.D.   On: 03/31/2021 14:40   ECHOCARDIOGRAM COMPLETE  Result Date: 04/01/2021    ECHOCARDIOGRAM REPORT   Patient Name:   [name] Date of Exam: 04/01/2021 Medical Rec #:  [id number]     Height:       70.0 in Accession #:    [id number]    Weight:       185.0 lb Date of Birth:  October 19, 1933      BSA:          2.019 m Patient Age:    58 years      BP:           158/89 mmHg Patient Gender: M             HR:           66 bpm. Exam Location:  Inpatient Procedure: 2D Echo, Cardiac Doppler, Color Doppler and Intracardiac            Opacification Agent Indications:    R94.31 Abnormal EKG  History:        Patient has no prior history of Echocardiogram examinations.                 CHF, CAD and Previous Myocardial Infarction, Arrythmias:RBBB and  Atrial Flutter; Risk Factors:Hypertension.  Sonographer:    Glo Herring Referring Phys: [place]  Sonographer Comments: Suboptimal apical window. IMPRESSIONS  1. Global mild to moderate hypokinesis with akinesis of the anterior, anteroseptal wall and apex  c/w LAD disease. Left ventricular ejection fraction, by estimation, is 40 to 45%. The left ventricle has mildly decreased function. The left ventricle demonstrates regional wall motion abnormalities (see scoring diagram/findings for description). There is mild concentric left ventricular hypertrophy. Left ventricular diastolic parameters are indeterminate.  2. Right ventricular systolic function was not well visualized. The right ventricular size is not well visualized.  3. Left atrial size was moderately dilated.  4. Right atrium is dilated.  5. The mitral valve is grossly normal. No evidence of mitral valve regurgitation.  6. The aortic valve is grossly normal. Aortic valve regurgitation is mild. Aortic valve sclerosis/calcification is present, without any evidence of aortic stenosis.  7. The inferior vena cava not well visualized. Comparison(s): No prior Echocardiogram. FINDINGS  Left Ventricle: Global mild to moderate hypokinesis with akinesis of the anterior, anteroseptal wall and apex c/w LAD disease. Left ventricular ejection fraction, by estimation, is 40 to 45%. The left ventricle has mildly decreased function. The left ventricle demonstrates regional wall motion abnormalities. Definity contrast agent was given IV to delineate the left ventricular endocardial borders. The left ventricular internal cavity size was normal in size. There is mild concentric left ventricular  hypertrophy. Left ventricular diastolic parameters are indeterminate. Right Ventricle: The right ventricular size is not well visualized. Right vetricular wall thickness was not well visualized. Right ventricular systolic function was not well visualized. Left Atrium: Left atrial size was moderately dilated. Right Atrium: Right atrium is dilated. Pericardium: There is no evidence of pericardial effusion. Mitral Valve: The mitral valve is grossly normal. No evidence of mitral valve regurgitation. MV peak gradient, 2.9 mmHg. The mean mitral  valve gradient is 1.0 mmHg. Tricuspid Valve: The tricuspid valve is grossly normal. Tricuspid valve regurgitation is trivial. Aortic Valve: The aortic valve is grossly normal. Aortic valve regurgitation is mild. Aortic valve sclerosis/calcification is present, without any evidence of aortic stenosis. Aortic valve mean gradient measures 4.0 mmHg. Aortic valve peak gradient measures 7.5 mmHg. Aortic valve area, by VTI measures 2.32 cm. Pulmonic Valve: The pulmonic valve was grossly normal. Pulmonic valve regurgitation is not visualized. Aorta: The aortic root and ascending aorta are structurally normal, with no evidence of dilitation. Venous: The inferior vena cava not well visualized. IAS/Shunts: The interatrial septum was not well visualized.  LEFT VENTRICLE PLAX 2D LVIDd:         4.50 cm      Diastology LVIDs:         3.40 cm      LV e' medial:    4.13 cm/s LV PW:         1.10 cm      LV E/e' medial:  18.5 LV IVS:        1.10 cm      LV e' lateral:   6.45 cm/s LVOT diam:     2.00 cm      LV E/e' lateral: 11.8 LV SV:         57 LV SV Index:   28 LVOT Area:     3.14 cm  LV Volumes (MOD) LV vol d, MOD A2C: 156.0 ml LV vol d, MOD A4C: 164.0 ml LV vol s, MOD A2C: 76.4 ml LV vol s, MOD A4C: 91.1 ml LV SV  MOD A2C:     79.6 ml LV SV MOD A4C:     164.0 ml LV SV MOD BP:      76.3 ml RIGHT VENTRICLE RV S prime:     7.65 cm/s LEFT ATRIUM             Index LA diam:        5.10 cm 2.53 cm/m LA Vol (A2C):   64.0 ml 31.69 ml/m LA Vol (A4C):   73.6 ml 36.45 ml/m LA Biplane Vol: 72.5 ml 35.90 ml/m  AORTIC VALVE                    PULMONIC VALVE AV Area (Vmax):    2.19 cm     PV Vmax:       1.00 m/s AV Area (Vmean):   2.19 cm     PV Peak grad:  4.0 mmHg AV Area (VTI):     2.32 cm AV Vmax:           137.00 cm/s AV Vmean:          89.900 cm/s AV VTI:            0.246 m AV Peak Grad:      7.5 mmHg AV Mean Grad:      4.0 mmHg LVOT Vmax:         95.30 cm/s LVOT Vmean:        62.700 cm/s LVOT VTI:          0.182 m LVOT/AV VTI  ratio: 0.74  AORTA Ao Root diam: 3.50 cm Ao Asc diam:  3.40 cm MITRAL VALVE               TRICUSPID VALVE MV Area (PHT): 4.31 cm    TR Peak grad:   30.9 mmHg MV Area VTI:   2.13 cm    TR Vmax:        278.00 cm/s MV Peak grad:  2.9 mmHg MV Mean grad:  1.0 mmHg    SHUNTS MV Vmax:       0.85 m/s    Systemic VTI:  0.18 m MV Vmean:      48.0 cm/s   Systemic Diam: 2.00 cm MV Decel Time: 176 msec MV E velocity: 76.30 cm/s MV A velocity: 73.70 cm/s MV E/A ratio:  1.04 Mary Scientist, physiological signed by Phineas Inches Signature Date/Time: 04/01/2021/10:20:26 AM    Final     Review of Systems  Constitutional: Negative.   Musculoskeletal:  Positive for joint pain (Great toe right).  Skin:        Ulcer right great toe  All other systems reviewed and are negative. Blood pressure 136/71, pulse 67, temperature 98.2 F (36.8 C), temperature source Oral, resp. rate 14, height 5\' 10"  (1.778 m), weight 83.9 kg, SpO2 97 %.  Vitals:   04/02/21 0425 04/02/21 0739  BP: 135/88 136/71  Pulse: 69 67  Resp: 16 14  Temp: 98.5 F (36.9 C) 98.2 F (36.8 C)  SpO2:  97%    General AA&O x3. Normal mood and affect.  Vascular Dorsalis pedis and posterior tibial pulses  absent right  Capillary refill delayed to hallux Pedal hair growth diminished.  Neurologic Epicritic sensation grossly present.  Dermatologic (Wound) Necrotic ulcer plantar medial hallux  Orthopedic: Motor intact BLE.    Assessment/Plan:  Right hallux ulcer secondary to PAD with infection -Imaging: Studies independently reviewed.  CT scan shows no evidence of osteomyelitis.  MRI would  be preferable but this is difficult with his pacemaker.  I recommend a three-phase bone scan and this has been ordered. -Antibiotics: Continue broad-spectrum antibiotics -WB Status: WBAT RLE -Wound Care: Can leave open to air at this point still, likely will change post procedure -Surgical Plan: Pending results of bone scan.  If no osteomyelitis would favor local  debridement and wound care  Criselda Peaches 04/02/2021, 10:23 AM   Best available via secure chat for questions or concerns.

## 2021-04-03 DIAGNOSIS — I70261 Atherosclerosis of native arteries of extremities with gangrene, right leg: Secondary | ICD-10-CM

## 2021-04-03 DIAGNOSIS — I5022 Chronic systolic (congestive) heart failure: Secondary | ICD-10-CM | POA: Diagnosis not present

## 2021-04-03 DIAGNOSIS — S91301A Unspecified open wound, right foot, initial encounter: Secondary | ICD-10-CM

## 2021-04-03 DIAGNOSIS — L02619 Cutaneous abscess of unspecified foot: Secondary | ICD-10-CM | POA: Diagnosis not present

## 2021-04-03 DIAGNOSIS — L03115 Cellulitis of right lower limb: Secondary | ICD-10-CM

## 2021-04-03 DIAGNOSIS — L03119 Cellulitis of unspecified part of limb: Secondary | ICD-10-CM | POA: Diagnosis not present

## 2021-04-03 LAB — CBC
HCT: 44.1 % (ref 39.0–52.0)
Hemoglobin: 14.6 g/dL (ref 13.0–17.0)
MCH: 29.8 pg (ref 26.0–34.0)
MCHC: 33.1 g/dL (ref 30.0–36.0)
MCV: 90 fL (ref 80.0–100.0)
Platelets: 167 10*3/uL (ref 150–400)
RBC: 4.9 MIL/uL (ref 4.22–5.81)
RDW: 11.6 % (ref 11.5–15.5)
WBC: 7.6 10*3/uL (ref 4.0–10.5)
nRBC: 0 % (ref 0.0–0.2)

## 2021-04-03 LAB — BASIC METABOLIC PANEL
Anion gap: 11 (ref 5–15)
BUN: 16 mg/dL (ref 8–23)
CO2: 18 mmol/L — ABNORMAL LOW (ref 22–32)
Calcium: 8.3 mg/dL — ABNORMAL LOW (ref 8.9–10.3)
Chloride: 106 mmol/L (ref 98–111)
Creatinine, Ser: 1.01 mg/dL (ref 0.61–1.24)
GFR, Estimated: >60 mL/min (ref >60)
Glucose, Bld: 110 mg/dL — ABNORMAL HIGH (ref 70–99)
Potassium: 3.8 mmol/L (ref 3.5–5.1)
Sodium: 135 mmol/L (ref 135–145)

## 2021-04-03 MED ORDER — VANCOMYCIN HCL 1250 MG/250ML IV SOLN
1250.0000 mg | INTRAVENOUS | Status: DC
  Administered 2021-04-03 – 2021-04-06 (×4): 1250 mg via INTRAVENOUS
  Filled 2021-04-03 (×4): qty 250

## 2021-04-03 NOTE — Progress Notes (Signed)
PROGRESS NOTE  [name] [id number] DOB: 1933-05-12 DOA: 03/31/2021 PCP: Raina Mina., MD   LOS: 2 days   Brief Narrative / Interim history:  88 y.o. male with medical history significant of coronary artery disease, ischemic cardiomyopathy with chronic systolic CHF with prior EF of 25%, history of CABG in 2006, a flutter status post ablation in 2014, VT, also sick sinus syndrome status post pacemaker.  He has been dealing with a right lower extremity callus for several weeks, seen as an outpatient by podiatry.  Over the last 3 to 4 days, he has noticed increased pain to the right lower extremity, increased swelling and redness.  He has a degree of neuropathy and does not feel much.  He was seen in podiatrist office today, was felt to have significant worsening, cellulitis, and was sent to the emergency room.  Subjective / 24h Interval events: -Crying this morning, says that he just wants to go home  Assessment & Plan: * Cellulitis and abscess of foot- (present on admission) - Patient admitted with right foot cellulitis and abscess.  He has pus draining from right greater toe.  Cellulitis extends up to mid upper shins.  Placed on antibiotics.  There is concern for gas-forming organisms and add clindamycin along to Vanco and Zosyn.  Blood cultures pending.  Vascular surgery consulted, with plans for arteriogram tomorrow and meanwhile continue antibiotics.  Patient has a pacemaker, unable to obtain an MRI.  Podiatry consulted as well, ordered a bone scan to see whether there is any concern for osteomyelitis.  Bone scan still pending this morning.  Chronic systolic CHF (congestive heart failure) ([place]) -Last seen cardiology at [facility], Dr. Ola Spurr, in 2018.  It was mentioned that the EF at that time was 40 to 45%, and recently had a stress test which was negative.  2D echo done here shows EF 40-45% with mildly decreased LV function.  Suspect unchanged.  He has no heart failure symptoms, no  angina.  Currently he is not on goal-directed therapy, not clear when he stopped the medications but back in 2018 he was on metoprolol 12.5 twice daily as well as aspirin.  Discussed with the daughter, he has self discontinued the medication as he just does not like to take pills  CAD (coronary artery disease) with history of CABG -noted, no chest pain or apparent active issues  Paroxysmal atrial flutter ([place]) - With history of ablation, now has pacemaker.  Apparently he was on Coumadin in the past, and per care everywhere notes, in 2018, his Coumadin was stopped as he had no recurrence for A. fib or flutter for 1.5 years at that time.  Urinary hesitancy - Possibly due to BPH.  This is been going on for quite some time, but most recently has noticed that he needs to urinate more often during the nighttime.  He is supposed to see urology next week.  Continue Flomax, apparently no further issues  PAD (peripheral artery disease) ([place]) -vascular consulted.  Arteriogram tomorrow  Sick sinus syndrome (HCC) status post pacemaker- (present on admission) - Keep on medical telemetry  Dizziness -Chronic   Essential hypertension- (present on admission) -Monitor blood pressure, currently not taking any medications    Scheduled Meds:  enoxaparin (LOVENOX) injection  40 mg Subcutaneous Q24H   pantoprazole  40 mg Oral Daily   tamsulosin  0.4 mg Oral QPC supper   Continuous Infusions:  clindamycin (CLEOCIN) IV 600 mg (04/03/21 0544)   piperacillin-tazobactam (ZOSYN)  IV 3.375  g (04/03/21 [id number])   vancomycin 1,750 mg (04/01/21 1814)   PRN Meds:.acetaminophen, alum & mag hydroxide-simeth, bisacodyl, oxyCODONE  Diet Orders (From admission, onward)     Start     Ordered   04/04/21 0001  Diet NPO time specified Except for: Sips with Meds  Diet effective midnight       Comments: Hold hypoglycemics and diuretic medications  Question:  Except for  Answer:  Ferrel Logan with Meds   04/03/21 0745   03/31/21  1758  Diet regular Room service appropriate? Yes; Fluid consistency: Thin  Diet effective now       Question Answer Comment  Room service appropriate? Yes   Fluid consistency: Thin      03/31/21 1757            DVT prophylaxis: enoxaparin (LOVENOX) injection 40 mg Start: 03/31/21 2000   Lab Results  Component Value Date   PLT 167 04/03/2021      Code Status: Full Code  Family Communication: Verlin Dike, daughter [phone]  Status is: Inpatient  Level of care: Progressive  Consultants:  Vascular Podiatry   Procedures:  2D echo  Microbiology  Blood cultures - pending  Antimicrobials: Vancomycin / Zosyn / Clindamycin    Objective: Vitals:   04/02/21 1918 04/02/21 2342 04/03/21 0425 04/03/21 0826  BP: 126/70 129/67 128/67 135/64  Pulse: 74 93 70 90  Resp: 19 (!) 21 20 17   Temp: 98.7 F (37.1 C) 98 F (36.7 C) 98.9 F (37.2 C) 98.7 F (37.1 C)  TempSrc: Oral Axillary Oral Oral  SpO2:    96%  Weight:      Height:        Intake/Output Summary (Last 24 hours) at 04/03/2021 1059 Last data filed at 04/02/2021 1843 Gross per 24 hour  Intake 1161.8 ml  Output 500 ml  Net 661.8 ml    Wt Readings from Last 3 Encounters:  03/31/21 83.9 kg  12/27/20 87.2 kg  07/08/19 88.2 kg    Examination:  Constitutional: NAD Eyes: lids and conjunctivae normal, no scleral icterus ENMT: mmm Neck: normal, supple Respiratory: clear to auscultation bilaterally, no wheezing, no crackles. Normal respiratory effort.  Cardiovascular: Regular rate and rhythm, no murmurs / rubs / gallops. No LE edema. Abdomen: soft, no distention, no tenderness. Bowel sounds positive.  Skin: no new rashes Neurologic: no focal deficits, equal strength   Data Reviewed: I have independently reviewed following labs and imaging studies  CBC Recent Labs  Lab 03/31/21 1351 04/01/21 0314 04/02/21 0224 04/03/21 0426  WBC 8.3 7.3 6.3 7.6  HGB 14.7 14.8 14.2 14.6  HCT 43.9 44.9 42.4 44.1   PLT 177 172 158 167  MCV 90.9 91.6 89.3 90.0  MCH 30.4 30.2 29.9 29.8  MCHC 33.5 33.0 33.5 33.1  RDW 11.6 11.7 11.5 11.6  LYMPHSABS 0.9  --   --   --   MONOABS 0.4  --   --   --   EOSABS 0.0  --   --   --   BASOSABS 0.0  --   --   --      Recent Labs  Lab 03/31/21 1351 03/31/21 1543 04/01/21 0314 04/02/21 0224 04/03/21 0426  NA 141  --  140 136 135  K 3.8  --  4.0 3.8 3.8  CL 103  --  105 107 106  CO2 25  --  25 23 18*  GLUCOSE 137*  --  98 121* 110*  BUN 18  --  15  14 16  CREATININE 0.81  --  0.97 0.88 1.01  CALCIUM 9.3  --  9.0 8.6* 8.3*  AST  --   --  25 27  --   ALT  --   --  18 17  --   ALKPHOS  --   --  69 59  --   BILITOT  --   --  0.8 0.6  --   ALBUMIN  --   --  3.2* 3.0*  --   MG  --   --   --  2.1  --   LATICACIDVEN  --  1.5  --   --   --   HGBA1C  --   --  5.5  --   --      ------------------------------------------------------------------------------------------------------------------ No results for input(s): CHOL, HDL, LDLCALC, TRIG, CHOLHDL, LDLDIRECT in the last 72 hours.  Lab Results  Component Value Date   HGBA1C 5.5 04/01/2021   ------------------------------------------------------------------------------------------------------------------ No results for input(s): TSH, T4TOTAL, T3FREE, THYROIDAB in the last 72 hours.  Invalid input(s): FREET3  Cardiac Enzymes No results for input(s): CKMB, TROPONINI, MYOGLOBIN in the last 168 hours.  Invalid input(s): CK ------------------------------------------------------------------------------------------------------------------ No results found for: BNP  CBG: No results for input(s): GLUCAP in the last 168 hours.  Recent Results (from the past 240 hour(s))  Resp Panel by RT-PCR (Flu A&B, Covid) Nasopharyngeal Swab     Status: None   Collection Time: 03/31/21  1:46 PM   Specimen: Nasopharyngeal Swab; Nasopharyngeal(NP) swabs in vial transport medium  Result Value Ref Range Status   SARS  Coronavirus 2 by RT PCR NEGATIVE NEGATIVE Final    Comment: (NOTE) SARS-CoV-2 target nucleic acids are NOT DETECTED.  The SARS-CoV-2 RNA is generally detectable in upper respiratory specimens during the acute phase of infection. The lowest concentration of SARS-CoV-2 viral copies this assay can detect is 138 copies/mL. A negative result does not preclude SARS-Cov-2 infection and should not be used as the sole basis for treatment or other patient management decisions. A negative result may occur with  improper specimen collection/handling, submission of specimen other than nasopharyngeal swab, presence of viral mutation(s) within the areas targeted by this assay, and inadequate number of viral copies(<138 copies/mL). A negative result must be combined with clinical observations, patient history, and epidemiological information. The expected result is Negative.  Fact Sheet for Patients:  EntrepreneurPulse.com.au  Fact Sheet for Healthcare Providers:  IncredibleEmployment.be  This test is no t yet approved or cleared by the [place] FDA and  has been authorized for detection and/or diagnosis of SARS-CoV-2 by FDA under an Emergency Use Authorization (EUA). This EUA will remain  in effect (meaning this test can be used) for the duration of the COVID-19 declaration under Section 564(b)(1) of the Act, 21 U.S.C.section 360bbb-3(b)(1), unless the authorization is terminated  or revoked sooner.       Influenza A by PCR NEGATIVE NEGATIVE Final   Influenza B by PCR NEGATIVE NEGATIVE Final    Comment: (NOTE) The Xpert Xpress SARS-CoV-2/FLU/RSV plus assay is intended as an aid in the diagnosis of influenza from Nasopharyngeal swab specimens and should not be used as a sole basis for treatment. Nasal washings and aspirates are unacceptable for Xpert Xpress SARS-CoV-2/FLU/RSV testing.  Fact Sheet for  Patients: EntrepreneurPulse.com.au  Fact Sheet for Healthcare Providers: IncredibleEmployment.be  This test is not yet approved or cleared by the [place] FDA and has been authorized for detection and/or diagnosis of SARS-CoV-2 by FDA under an Emergency  Use Authorization (EUA). This EUA will remain in effect (meaning this test can be used) for the duration of the COVID-19 declaration under Section 564(b)(1) of the Act, 21 U.S.C. section 360bbb-3(b)(1), unless the authorization is terminated or revoked.  Performed at [facility], [place] [address] [place], [place] [address]   Culture, blood (routine x 2)     Status: None (Preliminary result)   Collection Time: 03/31/21  3:43 PM   Specimen: BLOOD LEFT FOREARM  Result Value Ref Range Status   Specimen Description BLOOD LEFT FOREARM  Final   Special Requests   Final    BOTTLES DRAWN AEROBIC AND ANAEROBIC Blood Culture results may not be optimal due to an inadequate volume of blood received in culture bottles   Culture   Final    NO GROWTH 3 DAYS Performed at [facility], [place] [address], [place], [place] [address]    Report Status PENDING  Incomplete      Radiology Studies: No results found.   Marzetta Board, MD, PhD [facility]  Between 7 am - 7 pm I am available, please contact me via Amion (for emergencies) or Securechat (non urgent messages)  Between 7 pm - 7 am I am not available, please contact night coverage MD/APP via Amion

## 2021-04-03 NOTE — Progress Notes (Signed)
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  He is scheduled for an arteriogram tomorrow.  I have answered all his questions and he is agreeable to proceed.  I have written preop orders.  His renal function is normal.  He has palpable femoral pulses.  SUBJECTIVE:   No specific complaints this morning.  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   LABS:   Lab Results  Component Value Date   WBC 7.6 04/03/2021   HGB 14.6 04/03/2021   HCT 44.1 04/03/2021   MCV 90.0 04/03/2021   PLT 167 04/03/2021   Lab Results  Component Value Date   CREATININE 1.01 04/03/2021     PROBLEM LIST:    Principal Problem:   Cellulitis and abscess of foot Active Problems:   CAD (coronary artery disease) with history of CABG   Essential hypertension   Hx of CABG   Hyperlipidemia   Dizziness   Presence of cardiac pacemaker   Sick sinus syndrome (HCC) status post pacemaker   Chronic systolic CHF (congestive heart failure) (HCC)   Macular degeneration   PAD (peripheral artery disease) (HCC)   Paroxysmal atrial flutter (HCC)   Urinary hesitancy   CURRENT MEDS:    enoxaparin (LOVENOX) injection  40 mg Subcutaneous Q24H   pantoprazole  40 mg Oral Daily   tamsulosin  0.4 mg Oral QPC supper    Deitra Mayo Office: [phone] 04/03/2021

## 2021-04-03 NOTE — Evaluation (Signed)
Physical Therapy Evaluation Patient Details Name: [name] MRN: [id number] DOB: 11/10/1933 Today's Date: 04/03/2021  History of Present Illness  88 y.o. male presents to ED with RLE wound being followed by outpatient podiatry. Increased pain over last 3-4 days, and outpatient podiatrist sent to ED 03/31/21. Foot x-ray shows concern for soft tissue gas/infection with a gas-forming organism. 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 Scheduled for arteriogram 04/04/21. PMH: coronary artery disease, ischemic cardiomyopathy with chronic systolic CHF with prior EF of 25%, history of CABG in 2006, a flutter status post ablation in 2014, VT, also sick sinus syndrome status post pacemaker.  Clinical Impression  PTA pt living alone in single story home with 8 steps to enter. Pt has been having increasing difficulty with mobility due to increased pain in R great toe and LE. Pt was ambulating around his home with cane for stability, independent in bADLs. Daughters live 1/4 mile away and have groceries delivered and provide transportation and assist with bill pay. Pt is currently limited in safe mobility by increased R foot pain, he reports the house phone fell on it when he was trying to get to the bathroom this morning. Pt is also experiencing decreased balance and endurance. Pt is mod I for bed mobility and min A for transfer from bed to recliner. PT recommending pt return home with help of his family and HHPT to regain independence with his mobility. PT will continue to follow acutely.         Recommendations for follow up therapy are one component of a multi-disciplinary discharge planning process, led by the attending physician.  Recommendations may be updated based on patient status, additional functional criteria and insurance authorization.  Follow Up Recommendations Home health PT    Assistance Recommended at  Discharge Frequent or constant Supervision/Assistance  Patient can return home with the following  A little help with walking and/or transfers;A little help with bathing/dressing/bathroom;Assistance with cooking/housework;Assist for transportation;Help with stairs or ramp for entrance    Equipment Recommendations Rolling walker (2 wheels);BSC/3in1  Recommendations for Other Services  OT consult    Functional Status Assessment Patient has had a recent decline in their functional status and demonstrates the ability to make significant improvements in function in a reasonable and predictable amount of time.     Precautions / Restrictions Precautions Precautions: Fall Restrictions Weight Bearing Restrictions: No      Mobility  Bed Mobility Overal bed mobility: Modified Independent             General bed mobility comments: HoB elevated with increased time and effort to scoot hip to EoB    Transfers Overall transfer level: Needs assistance Equipment used: Rolling walker (2 wheels) Transfers: Sit to/from Stand, Bed to chair/wheelchair/BSC Sit to Stand: Min assist, From elevated surface   Step pivot transfers: Min assist       General transfer comment: vc for hand placement for power up, good power up, light min A for steadying when weightbearing through R LE and with stepping to recliner on R. Pt refuses further ambulation due to increased R foot pain in dependent position    Ambulation/Gait               General Gait Details: deferred due to pain         Balance Overall balance assessment: Needs assistance Sitting-balance support: Feet supported, No upper extremity supported Sitting  balance-Leahy Scale: Good     Standing balance support: Single extremity supported, Bilateral upper extremity supported Standing balance-Leahy Scale: Poor Standing balance comment: requires at least single UE support                             Pertinent  Vitals/Pain Pain Assessment Pain Assessment: 0-10 Pain Score: 4  Pain Location: R big toe and foot, pain increased this am as phone fell on R foot when he was attempting to go to bathroom Pain Descriptors / Indicators: Throbbing, Sharp, Aching, Discomfort, Guarding, Grimacing Pain Intervention(s): Limited activity within patient's tolerance, Monitored during session, Premedicated before session, Repositioned    Home Living Family/patient expects to be discharged to:: Private residence Living Arrangements: Alone Available Help at Discharge: Family;Available PRN/intermittently Type of Home: House Home Access: Stairs to enter Entrance Stairs-Rails: Can reach both Entrance Stairs-Number of Steps: 8   Home Layout: One level Home Equipment: Cane - single point;Standard Walker;Grab bars - tub/shower      Prior Function Prior Level of Function : Needs assist             Mobility Comments: pt reports using cane for stability, requires increased time and use of handrails to climb stairs into his home ADLs Comments: family orders groceries for delivery, and provide transportation to doctor's appointments, and assist for bill paying     Hand Dominance   Dominant Hand: Right    Extremity/Trunk Assessment   Upper Extremity Assessment Upper Extremity Assessment: Overall WFL for tasks assessed    Lower Extremity Assessment Lower Extremity Assessment: RLE deficits/detail RLE Deficits / Details: ROM WFL, strength assessed during mobilization to be grossly 3+/5, foot and calf noted to be errythematous with increased edema, ulcer on R big toe       Communication   Communication: HOH  Cognition Arousal/Alertness: Awake/alert Behavior During Therapy: Flat affect Overall Cognitive Status: Within Functional Limits for tasks assessed                                 General Comments: tearful at end of session, "I want to die at home"        General Comments General  comments (skin integrity, edema, etc.): VSS on RA, R foot and calf noted to be errythematous with increased edema, ulcer on R big toe        Assessment/Plan    PT Assessment Patient needs continued PT services  PT Problem List Decreased activity tolerance;Decreased balance;Decreased mobility;Cardiopulmonary status limiting activity;Decreased skin integrity;Pain       PT Treatment Interventions DME instruction;Gait training;Stair training;Functional mobility training;Therapeutic activities;Therapeutic exercise;Balance training;Cognitive remediation;Patient/family education    PT Goals (Current goals can be found in the Care Plan section)  Acute Rehab PT Goals Patient Stated Goal: go home PT Goal Formulation: With patient Time For Goal Achievement: 04/17/21 Potential to Achieve Goals: Good    Frequency Min 3X/week        AM-PAC PT "6 Clicks" Mobility  Outcome Measure Help needed turning from your back to your side while in a flat bed without using bedrails?: None Help needed moving from lying on your back to sitting on the side of a flat bed without using bedrails?: None Help needed moving to and from a bed to a chair (including a wheelchair)?: A Little Help needed standing up from a chair using your arms (e.g., wheelchair or  bedside chair)?: A Little Help needed to walk in hospital room?: A Lot Help needed climbing 3-5 steps with a railing? : Total 6 Click Score: 17    End of Session Equipment Utilized During Treatment: Gait belt Activity Tolerance: Patient limited by pain Patient left: in chair;with call bell/phone within reach;with chair alarm set;Other (comment) (MD in room) Nurse Communication: Mobility status PT Visit Diagnosis: Unsteadiness on feet (R26.81);Other abnormalities of gait and mobility (R26.89);Muscle weakness (generalized) (M62.81);Difficulty in walking, not elsewhere classified (R26.2);Pain    Time: [id number] PT Time Calculation (min) (ACUTE ONLY): 29  min   Charges:   PT Evaluation $PT Eval Moderate Complexity: 1 Mod PT Treatments $Therapeutic Activity: 8-22 mins        Adrick Kestler B. Migdalia Dk PT, DPT Acute Rehabilitation Services Pager [phone] Office [phone]   [place] 04/03/2021, 9:44 AM

## 2021-04-03 NOTE — Progress Notes (Signed)
Pharmacy Antibiotic Note  [name] is a 88 y.o. male admitted on 03/31/2021 with cellulitis.  Pharmacy has been consulted for Vancomycin and Zosyn dosing.  Plan: Change to Vancomycin IV 1250mg  q24h (estimated AUC 412, goal 400-550) Zosyn 3.375g Q8h Clindamycin 600mg  Q8h Monitor renal function and signs of clinical improvement Follow-up cultures, plans for OR   Height: 5\' 10"  (177.8 cm) Weight: 83.9 kg (185 lb) IBW/kg (Calculated) : 73  Temp (24hrs), Avg:98.6 F (37 C), Min:98 F (36.7 C), Max:98.9 F (37.2 C)  Recent Labs  Lab 03/31/21 1351 03/31/21 1543 04/01/21 0314 04/02/21 0224 04/03/21 0426  WBC 8.3  --  7.3 6.3 7.6  CREATININE 0.81  --  0.97 0.88 1.01  LATICACIDVEN  --  1.5  --   --   --     Estimated Creatinine Clearance: 55.2 mL/min (by C-G formula based on SCr of 1.01 mg/dL).    Allergies  Allergen Reactions   Amiodarone Nausea And Vomiting    vomiting    Gabapentin     dizziness     Metoprolol Other (See Comments)    Drop in heart rate, dizziness.     Statins     myalgias   Doxycycline     Unknown to patient    Neomycin     Unknown to patient     Niacin     Unknown to patient    Ramipril     Unknown to patient      Antimicrobials this admission: 1/19 vancomycin >>  1/19 Zosyn >>  1/20 Clindamycin >>    Microbiology results: 1/19 BCx: NGTD (prelim)   Thank you for allowing pharmacy to be a part of this patients care.  2/19, PharmD Clinical Pharmacist

## 2021-04-03 NOTE — Assessment & Plan Note (Addendum)
-   history of ablation, now has pacemaker.  Apparently he was on Coumadin in the past, and per care everywhere notes, in 2018, his Coumadin was stopped as he had no recurrence for A. fib or flutter for 1.5 years at that time.

## 2021-04-04 ENCOUNTER — Encounter (HOSPITAL_COMMUNITY): Payer: Medicare Other

## 2021-04-04 ENCOUNTER — Encounter (HOSPITAL_COMMUNITY)
Admission: EM | Disposition: A | Discharge status: Skilled Nursing Facility | Payer: Self-pay | Source: Home / Self Care | Attending: Internal Medicine

## 2021-04-04 DIAGNOSIS — I70291 Other atherosclerosis of native arteries of extremities, right leg: Secondary | ICD-10-CM | POA: Diagnosis not present

## 2021-04-04 DIAGNOSIS — L02619 Cutaneous abscess of unspecified foot: Secondary | ICD-10-CM | POA: Diagnosis not present

## 2021-04-04 DIAGNOSIS — I771 Stricture of artery: Secondary | ICD-10-CM

## 2021-04-04 DIAGNOSIS — L97519 Non-pressure chronic ulcer of other part of right foot with unspecified severity: Secondary | ICD-10-CM

## 2021-04-04 DIAGNOSIS — L03119 Cellulitis of unspecified part of limb: Secondary | ICD-10-CM | POA: Diagnosis not present

## 2021-04-04 DIAGNOSIS — I70261 Atherosclerosis of native arteries of extremities with gangrene, right leg: Secondary | ICD-10-CM

## 2021-04-04 DIAGNOSIS — I5022 Chronic systolic (congestive) heart failure: Secondary | ICD-10-CM | POA: Diagnosis not present

## 2021-04-04 DIAGNOSIS — I70235 Atherosclerosis of native arteries of right leg with ulceration of other part of foot: Secondary | ICD-10-CM

## 2021-04-04 DIAGNOSIS — L03115 Cellulitis of right lower limb: Secondary | ICD-10-CM

## 2021-04-04 DIAGNOSIS — S81801A Unspecified open wound, right lower leg, initial encounter: Secondary | ICD-10-CM

## 2021-04-04 HISTORY — PX: ABDOMINAL AORTOGRAM W/LOWER EXTREMITY: CATH118223

## 2021-04-04 SURGERY — ABDOMINAL AORTOGRAM W/LOWER EXTREMITY
Anesthesia: LOCAL | Laterality: Bilateral

## 2021-04-04 MED ORDER — FENTANYL CITRATE (PF) 100 MCG/2ML IJ SOLN
INTRAMUSCULAR | Status: AC
  Filled 2021-04-04: qty 2

## 2021-04-04 MED ORDER — IODIXANOL 320 MG/ML IV SOLN
INTRAVENOUS | Status: DC | PRN
  Administered 2021-04-04: 90 mL

## 2021-04-04 MED ORDER — LIDOCAINE HCL (PF) 1 % IJ SOLN
INTRAMUSCULAR | Status: DC | PRN
  Administered 2021-04-04: 15 mL

## 2021-04-04 MED ORDER — MIDAZOLAM HCL 2 MG/2ML IJ SOLN
INTRAMUSCULAR | Status: AC
  Filled 2021-04-04: qty 2

## 2021-04-04 MED ORDER — SODIUM CHLORIDE 0.9% FLUSH
3.0000 mL | Freq: Two times a day (BID) | INTRAVENOUS | Status: DC
  Administered 2021-04-04 – 2021-04-09 (×10): 3 mL via INTRAVENOUS

## 2021-04-04 MED ORDER — ACETAMINOPHEN 325 MG PO TABS
650.0000 mg | ORAL_TABLET | ORAL | Status: DC | PRN
  Administered 2021-04-04 – 2021-04-12 (×8): 650 mg via ORAL
  Filled 2021-04-04 (×8): qty 2

## 2021-04-04 MED ORDER — LIDOCAINE HCL (PF) 1 % IJ SOLN
INTRAMUSCULAR | Status: AC
  Filled 2021-04-04: qty 30

## 2021-04-04 MED ORDER — HEPARIN (PORCINE) IN NACL 1000-0.9 UT/500ML-% IV SOLN
INTRAVENOUS | Status: DC | PRN
  Administered 2021-04-04 (×2): 500 mL

## 2021-04-04 MED ORDER — ONDANSETRON HCL 4 MG/2ML IJ SOLN: 4.0000 mg | Freq: Four times a day (QID) | INTRAMUSCULAR | Status: DC | PRN

## 2021-04-04 MED ORDER — ASPIRIN EC 81 MG PO TBEC
81.0000 mg | DELAYED_RELEASE_TABLET | Freq: Every day | ORAL | Status: DC
  Administered 2021-04-04 – 2021-04-16 (×12): 81 mg via ORAL
  Filled 2021-04-04 (×12): qty 1

## 2021-04-04 MED ORDER — SODIUM CHLORIDE 0.9 % IV SOLN: INTRAVENOUS | Status: AC

## 2021-04-04 MED ORDER — SODIUM CHLORIDE 0.9 % IV SOLN: 250.0000 mL | INTRAVENOUS | Status: DC | PRN

## 2021-04-04 MED ORDER — SODIUM CHLORIDE 0.9 % IV SOLN: INTRAVENOUS | Status: DC

## 2021-04-04 MED ORDER — HYDRALAZINE HCL 20 MG/ML IJ SOLN: 5.0000 mg | INTRAMUSCULAR | Status: DC | PRN

## 2021-04-04 MED ORDER — FENTANYL CITRATE (PF) 100 MCG/2ML IJ SOLN
INTRAMUSCULAR | Status: DC | PRN
  Administered 2021-04-04: 50 ug via INTRAVENOUS

## 2021-04-04 MED ORDER — OXYCODONE HCL 5 MG PO TABS
5.0000 mg | ORAL_TABLET | ORAL | Status: DC | PRN
  Administered 2021-04-05 – 2021-04-08 (×2): 5 mg via ORAL
  Administered 2021-04-10: 10 mg via ORAL
  Administered 2021-04-10 – 2021-04-13 (×5): 5 mg via ORAL
  Filled 2021-04-04 (×5): qty 1
  Filled 2021-04-04 (×2): qty 2
  Filled 2021-04-04: qty 1

## 2021-04-04 MED ORDER — MIDAZOLAM HCL 2 MG/2ML IJ SOLN
INTRAMUSCULAR | Status: DC | PRN
  Administered 2021-04-04: 1 mg via INTRAVENOUS

## 2021-04-04 MED ORDER — HEPARIN (PORCINE) IN NACL 1000-0.9 UT/500ML-% IV SOLN
INTRAVENOUS | Status: AC
  Filled 2021-04-04: qty 1000

## 2021-04-04 MED ORDER — MORPHINE SULFATE (PF) 2 MG/ML IV SOLN: 2.0000 mg | INTRAVENOUS | Status: DC | PRN

## 2021-04-04 MED ORDER — SODIUM CHLORIDE 0.9% FLUSH: 3.0000 mL | INTRAVENOUS | Status: DC | PRN

## 2021-04-04 SURGICAL SUPPLY — 12 items
CATH CROSS OVER TEMPO 5F (CATHETERS) ×1 IMPLANT
CATH OMNI FLUSH 5F 65CM (CATHETERS) ×1 IMPLANT
CLOSURE MYNX CONTROL 5F (Vascular Products) ×1 IMPLANT
KIT MICROPUNCTURE NIT STIFF (SHEATH) ×1 IMPLANT
KIT PV (KITS) ×2 IMPLANT
MAT PREVALON FULL STRYKER (MISCELLANEOUS) ×1 IMPLANT
SHEATH PINNACLE 5F 10CM (SHEATH) ×1 IMPLANT
SHEATH PROBE COVER 6X72 (BAG) ×1 IMPLANT
SYR MEDRAD MARK V 150ML (SYRINGE) ×1 IMPLANT
TRANSDUCER W/STOPCOCK (MISCELLANEOUS) ×2 IMPLANT
TRAY PV CATH (CUSTOM PROCEDURE TRAY) ×2 IMPLANT
WIRE BENTSON .035X145CM (WIRE) ×1 IMPLANT

## 2021-04-04 NOTE — Care Management Important Message (Signed)
Important Message  Patient Details  Name: TAIMUR FIER MRN: [id number] Date of Birth: 06/13/1933   Medicare Important Message Given:  Yes     Stephens Shreve Stefan Church 04/04/2021, 3:27 PM

## 2021-04-04 NOTE — Op Note (Signed)
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 Randie Heinz, MD Procedure Performed: 1.  Ultrasound-guided cannulation left common femoral artery 2.  Aortogram 3.  Selection of right common femoral artery and right lower extremity angiogram 4.  Left lower extremity angiography 5.  Moderate sedation with fentanyl and Versed for 37 minutes 6.  Mynx device closure left common femoral artery   Indications: 88 year old male without previous vascular disease now has right great toe ulceration with severely decreased ABIs and toe pressures of 0.  He is indicated for aortogram with right lower extremity angiography possible intervention.  Findings: Patient has tortuous external iliac arteries.  The common iliac and aorta are calcified.  Renal arteries without any stenoses.  Right SFA is flush occluded there is a large profunda giving many collaterals.  There is an [place] of distal SFA.  He then reconstitutes intertibial artery which flows to the foot.  Posterior tibial artery appears to reconstitute but it occludes above the ankle.  On the left side his common femoral artery is calcified without any flow-limiting stenosis.  SFA is heavily calcified but patent down to the popliteal.  Trifurcation is patent.  All tibial vessels are heavily diseased with posterior tibial being the dominant runoff.  Patient will be considered for right femoral to anterior tibial artery bypass and vein mapping will be ordered.   Procedure:  The patient was identified in the holding area and taken to room 8.  The patient was then placed supine on the table and prepped and draped in the usual sterile fashion.  A time out was called.  Ultrasound was used to evaluate the left common femoral artery which was noted be patent.  The areas anesthetized  1% lidocaine cannulated micropuncture needle followed by wire and sheath.  Images saved to the permanent record.  Moderate sedation was administered with fentanyl and Versed and his vital signs were monitored throughout the case.  A Bentson wire was placed followed by 5 [place] sheath.  Omni catheter was placed at the level of L1 and aortogram was performed.  Crossover catheter was used to cross the bifurcation with Bentson wire and this was placed into the right common femoral artery and right lower extremity angiography was performed.  With the above findings we remove the catheter over the wire.  We then performed left lower extremity angiography through the existing sheath.  Minx device was then used to close the common femoral access site.  He tolerated procedure without any complication.   Contrast: 90cc  Makaelyn Aponte C. Randie Heinz, MD [facility] Office: [phone] Pager: [phone]

## 2021-04-04 NOTE — Progress Notes (Signed)
Occupational Therapy Evaluation Patient Details Name: [name] MRN: [id number] DOB: 04/19/1933 Today's Date: 04/04/2021   History of Present Illness 88 y.o. male presents to ED with RLE wound being followed by outpatient podiatry. Increased pain over last 3-4 days, and outpatient podiatrist sent to ED 03/31/21. Foot x-ray shows concern for soft tissue gas/infection with a gas-forming organism. 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 Scheduled for arteriogram 04/04/21. PMH: coronary artery disease, ischemic cardiomyopathy with chronic systolic CHF with prior EF of 40%25%, history of CABG in 2006, a flutter status post ablation in 2014, VT, also sick sinus syndrome status post pacemaker.   Clinical Impression   Pt is typically mod I for ADL/mobility with SPC, grab bars/DME. Pt's daughters provide transportation and food/groceries. They also assist with financial management. Today Pt is limited by pain in RLE, anxious about upcoming sx today. Pt was able to come EOB with mod I and increased time, decreased access to LB for ADL due to pain in RLE and unable to perform figure 4 at this time (may benefit from AE education), min guard for seated grooming tasks EOB, and min A for stand with RW with steps up the bed to return supine due to pending sx. OT will continue to follow acutely and post-op. At this time recommending HHOT post-acute to maximize safety and independence in ADL with assist from daughters post-acute.      Recommendations for follow up therapy are one component of a multi-disciplinary discharge planning process, led by the attending physician.  Recommendations may be updated based on patient status, additional functional criteria and insurance authorization.   Follow Up Recommendations  Home health OT    Assistance Recommended at Discharge Intermittent Supervision/Assistance  Patient can return home  with the following A little help with walking and/or transfers;A little help with bathing/dressing/bathroom;Assistance with feeding;Assistance with cooking/housework;Assist for transportation;Help with stairs or ramp for entrance    Functional Status Assessment  Patient has had a recent decline in their functional status and demonstrates the ability to make significant improvements in function in a reasonable and predictable amount of time.  Equipment Recommendations  Tub/shower seat    Recommendations for Other Services       Precautions / Restrictions Precautions Precautions: Fall      Mobility Bed Mobility Overal bed mobility: Modified Independent             General bed mobility comments: HoB elevated with increased time and effort to scoot hip to EoB    Transfers Overall transfer level: Needs assistance Equipment used: Rolling walker (2 wheels) Transfers: Sit to/from Stand, Bed to chair/wheelchair/BSC Sit to Stand: Min assist, From elevated surface     Step pivot transfers: Min assist     General transfer comment: vc for hand placement for power up, good power up, light min A for steadying when weightbearing through R LE, small steps up the bed as Pt pending sx      Balance Overall balance assessment: Needs assistance Sitting-balance support: Feet supported, No upper extremity supported Sitting balance-Leahy Scale: Good     Standing balance support: Single extremity supported, Bilateral upper extremity supported Standing balance-Leahy Scale: Poor Standing balance comment: requires at least single UE support                           ADL either performed or assessed  with clinical judgement   ADL Overall ADL's : Needs assistance/impaired Eating/Feeding: NPO Eating/Feeding Details (indicate cue type and reason): pending sx Grooming: Set up;Oral care;Wash/dry face;Sitting Grooming Details (indicate cue type and reason): EOB Upper Body Bathing:  Set up;Sitting   Lower Body Bathing: Sitting/lateral leans;Minimal assistance   Upper Body Dressing : Set up;Sitting   Lower Body Dressing: Sit to/from stand;Moderate assistance   Toilet Transfer: Minimal assistance;BSC/3in1;Rolling walker (2 wheels)   Toileting- Clothing Manipulation and Hygiene: Min guard;Sitting/lateral lean       Functional mobility during ADLs: Minimal assistance;Rolling walker (2 wheels) General ADL Comments: Pt limited by pain and swelling in RLE     Vision Patient Visual Report: No change from baseline       Perception     Praxis      Pertinent Vitals/Pain Pain Assessment Pain Assessment: 0-10 Pain Score: 5  Pain Location: R big toe and foot, pain increased this am as phone fell on R foot when he was attempting to go to bathroom Pain Descriptors / Indicators: Throbbing, Sharp, Aching, Discomfort, Guarding, Grimacing Pain Intervention(s): Limited activity within patient's tolerance, Monitored during session, Repositioned, Other (comment) (elevation)     Hand Dominance Right   Extremity/Trunk Assessment Upper Extremity Assessment Upper Extremity Assessment: Overall WFL for tasks assessed   Lower Extremity Assessment Lower Extremity Assessment: Defer to PT evaluation       Communication Communication Communication: HOH   Cognition Arousal/Alertness: Awake/alert Behavior During Therapy: Flat affect Overall Cognitive Status: Within Functional Limits for tasks assessed                                       General Comments  VSS on RA, R foot and calf noted to be errythematous with increased edema, ulcer on R big toe    Exercises     Shoulder Instructions      Home Living Family/patient expects to be discharged to:: Private residence Living Arrangements: Alone Available Help at Discharge: Family;Available PRN/intermittently Type of Home: House Home Access: Stairs to enter [employer] of Steps: 8 Entrance  Stairs-Rails: Can reach both Home Layout: One level     Bathroom Shower/Tub: Chief Strategy Officer: Standard Bathroom Accessibility: Yes   Home Equipment: Cane - single point;Standard Walker;Grab bars - tub/shower          Prior Functioning/Environment Prior Level of Function : Needs assist             Mobility Comments: pt reports using cane for stability, requires increased time and use of handrails to climb stairs into his home ADLs Comments: family orders groceries for delivery, and provide transportation to doctor's appointments, and assist for bill paying        OT Problem List: Decreased activity tolerance;Impaired balance (sitting and/or standing);Decreased knowledge of use of DME or AE;Pain;Increased edema      OT Treatment/Interventions: Self-care/ADL training;DME and/or AE instruction;Therapeutic activities;Patient/family education;Balance training    OT Goals(Current goals can be found in the care plan section) Acute Rehab OT Goals Patient Stated Goal: for surgery to go well OT Goal Formulation: With patient Time For Goal Achievement: 04/18/21 Potential to Achieve Goals: Good  OT Frequency: Min 2X/week    Co-evaluation              AM-PAC OT "6 Clicks" Daily Activity     Outcome Measure Help from another person eating meals?: None  Help from another person taking care of personal grooming?: A Little Help from another person toileting, which includes using toliet, bedpan, or urinal?: A Little Help from another person bathing (including washing, rinsing, drying)?: A Little Help from another person to put on and taking off regular upper body clothing?: A Little Help from another person to put on and taking off regular lower body clothing?: A Lot 6 Click Score: 18   End of Session Equipment Utilized During Treatment: Rolling walker (2 wheels) Nurse Communication: Mobility status  Activity Tolerance: Patient tolerated treatment  well;Patient limited by pain (mobility limited by pain) Patient left: in bed;with call bell/phone within reach;with bed alarm set  OT Visit Diagnosis: Unsteadiness on feet (R26.81);Muscle weakness (generalized) (M62.81);History of falling (Z91.81);Pain Pain - Right/Left: Right Pain - part of body: Leg                Time: 4235-3614 OT Time Calculation (min): 21 min Charges:  OT General Charges $OT Visit: 1 Visit OT Evaluation $OT Eval Moderate Complexity: 1 Mod  [name] OTR/L Acute Rehabilitation Services Pager: [phone] Office: [phone]  [name] 04/04/2021, 12:18 PM

## 2021-04-04 NOTE — Interval H&P Note (Signed)
History and Physical Interval Note:  04/04/2021 11:55 AM  [name]  has presented today for surgery, with the diagnosis of right lower extremity wound.  The various methods of treatment have been discussed with the patient and family. After consideration of risks, benefits and other options for treatment, the patient has consented to  Procedure(s): ABDOMINAL AORTOGRAM W/LOWER EXTREMITY (N/A) as a surgical intervention.  The patient's history has been reviewed, patient examined, no change in status, stable for surgery.  I have reviewed the patient's chart and labs.  Questions were answered to the patient's satisfaction.     Lemar Livings

## 2021-04-04 NOTE — Progress Notes (Addendum)
Patient complaining of rash on buttocks and back burning after completing CHG bath and linen change.  RN assessed and noted redness and applied lotion.

## 2021-04-04 NOTE — Progress Notes (Signed)
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  He is scheduled for an arteriogram today.  I have answered all his questions and he is agreeable to proceed. His renal function is normal.  He has palpable femoral pulses.  SUBJECTIVE:   No specific complaints this morning.  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   LABS:   Lab Results  Component Value Date   WBC 7.6 04/03/2021   HGB 14.6 04/03/2021   HCT 44.1 04/03/2021   MCV 90.0 04/03/2021   PLT 167 04/03/2021   Lab Results  Component Value Date   CREATININE 1.01 04/03/2021     PROBLEM LIST:    Principal Problem:   Cellulitis and abscess of foot Active Problems:   CAD (coronary artery disease) with history of CABG   Essential hypertension   Hx of CABG   Hyperlipidemia   Dizziness   Presence of cardiac pacemaker   Sick sinus syndrome (HCC) status post pacemaker   Chronic systolic CHF (congestive heart failure) (HCC)   Macular degeneration   PAD (peripheral artery disease) (HCC)   Paroxysmal atrial flutter (HCC)   Urinary hesitancy   CURRENT MEDS:    enoxaparin (LOVENOX) injection  40 mg Subcutaneous Q24H   pantoprazole  40 mg Oral Daily   tamsulosin  0.4 mg Oral QPC supper    04/05/2021  Office: Victorino Sparrow 04/04/2021

## 2021-04-04 NOTE — Progress Notes (Signed)
PROGRESS NOTE  [name] [id number] DOB: [birth year] DOA: 03/31/2021 PCP: Raina Mina., MD   LOS: 3 days   Brief Narrative / Interim history:  88 y.o. male with medical history significant of coronary artery disease, ischemic cardiomyopathy with chronic systolic CHF with prior EF of 25%, history of CABG in 2006, a flutter status post ablation in 2014, VT, also sick sinus syndrome status post pacemaker.  He has been dealing with a right lower extremity callus for several weeks, seen as an outpatient by podiatry.  Over the last 3 to 4 days, he has noticed increased pain to the right lower extremity, increased swelling and redness.  He has a degree of neuropathy and does not feel much.  He was seen in podiatrist office today, was felt to have significant worsening, cellulitis, and was sent to the emergency room.  Subjective / 24h Interval events: -Doing well this morning, awaiting procedure later on  Assessment & Plan: * Cellulitis and abscess of foot- (present on admission) - Patient admitted with right foot cellulitis and abscess.  He has pus draining from right greater toe.  Cellulitis extends up to mid upper shins.  Placed on antibiotics.  There is concern for gas-forming organisms and add clindamycin along to Vanco and Zosyn.  Blood cultures pending.  Vascular surgery consulted, with plans for arteriogram today at noon, continue antibiotics.  Patient has a pacemaker, unable to obtain an MRI.  Podiatry consulted as well, ordered a bone scan to see whether there is any concern for osteomyelitis.  Bone scan still pending  Chronic systolic CHF (congestive heart failure) ([place]) -Last seen cardiology at [facility], Dr. Ola Spurr, in 2018.  It was mentioned that the EF at that time was 40 to 45%, and recently had a stress test which was negative.  2D echo done here shows EF 40-45% with mildly decreased LV function.  Suspect unchanged.  He has no heart failure symptoms, no angina.  Currently he is not  on goal-directed therapy, not clear when he stopped the medications but back in 2018 he was on metoprolol 12.5 twice daily as well as aspirin.  Discussed with the daughter, he has self discontinued the medication as he just does not like to take pills.  Currently appears stable  CAD (coronary artery disease) with history of CABG -noted, no chest pain or apparent active issues  Paroxysmal atrial flutter ([place]) - With history of ablation, now has pacemaker.  Apparently he was on Coumadin in the past, and per care everywhere notes, in 2018, his Coumadin was stopped as he had no recurrence for A. fib or flutter for 1.5 years at that time.  Urinary hesitancy - Possibly due to BPH.  This is been going on for quite some time, but most recently has noticed that he needs to urinate more often during the nighttime.  He is supposed to see urology next week.  Continue Flomax, apparently no further issues  PAD (peripheral artery disease) ([place]) -vascular consulted.  Arteriogram today  Sick sinus syndrome ([place]) status post pacemaker- (present on admission) - Keep on medical telemetry.  He is paced  Dizziness -Chronic   Essential hypertension- (present on admission) -Monitor blood pressure, currently not taking any medications, has remained within acceptable parameters    Scheduled Meds:  enoxaparin (LOVENOX) injection  40 mg Subcutaneous Q24H   pantoprazole  40 mg Oral Daily   tamsulosin  0.4 mg Oral QPC supper   Continuous Infusions:  sodium chloride 100 mL/hr at 04/04/21  [id number]   clindamycin (CLEOCIN) IV 600 mg (04/04/21 0547)   piperacillin-tazobactam (ZOSYN)  IV 3.375 g (04/04/21 0837)   vancomycin 1,250 mg (04/03/21 1905)   PRN Meds:.acetaminophen, alum & mag hydroxide-simeth, bisacodyl, oxyCODONE  Diet Orders (From admission, onward)     Start     Ordered   04/04/21 0001  Diet NPO time specified Except for: Sips with Meds  Diet effective midnight       Comments: Hold hypoglycemics and  diuretic medications  Question:  Except for  Answer:  Sips with Meds   04/03/21 0745            DVT prophylaxis: enoxaparin (LOVENOX) injection 40 mg Start: 03/31/21 2000   Lab Results  Component Value Date   PLT 167 04/03/2021      Code Status: Full Code  Family Communication: Verlin Dike, daughter [phone]  Status is: Inpatient  Level of care: Progressive  Consultants:  Vascular Podiatry   Procedures:  2D echo  Microbiology  Blood cultures - pending  Antimicrobials: Vancomycin / Zosyn / Clindamycin    Objective: Vitals:   04/03/21 1906 04/03/21 2257 04/04/21 0332 04/04/21 0743  BP: 137/77 121/77 131/68 119/73  Pulse: 86 76 63 62  Resp: 13 19 18  (!) 22  Temp: 97.8 F (36.6 C) 97.8 F (36.6 C) 97.8 F (36.6 C) 98.7 F (37.1 C)  TempSrc: Oral Oral Oral Oral  SpO2: 94% 98% 96% 99%  Weight:      Height:        Intake/Output Summary (Last 24 hours) at 04/04/2021 0940 Last data filed at 04/04/2021 0800 Gross per 24 hour  Intake 400 ml  Output 1350 ml  Net -950 ml    Wt Readings from Last 3 Encounters:  03/31/21 83.9 kg  12/27/20 87.2 kg  07/08/19 88.2 kg    Examination: Constitutional: NAD Eyes: lids and conjunctivae normal, no scleral icterus ENMT: mmm Neck: normal, supple Respiratory: clear to auscultation bilaterally, no wheezing, no crackles. Normal respiratory effort.  Cardiovascular: Regular rate and rhythm, no murmurs / rubs / gallops. No LE edema. Abdomen: soft, no distention, no tenderness. Bowel sounds positive.  Skin: no new rashes Neurologic: no focal deficits, equal strength   Data Reviewed: I have independently reviewed following labs and imaging studies  CBC Recent Labs  Lab 03/31/21 1351 04/01/21 0314 04/02/21 0224 04/03/21 0426  WBC 8.3 7.3 6.3 7.6  HGB 14.7 14.8 14.2 14.6  HCT 43.9 44.9 42.4 44.1  PLT 177 172 158 167  MCV 90.9 91.6 89.3 90.0  MCH 30.4 30.2 29.9 29.8  MCHC 33.5 33.0 33.5 33.1  RDW 11.6 11.7  11.5 11.6  LYMPHSABS 0.9  --   --   --   MONOABS 0.4  --   --   --   EOSABS 0.0  --   --   --   BASOSABS 0.0  --   --   --      Recent Labs  Lab 03/31/21 1351 03/31/21 1543 04/01/21 0314 04/02/21 0224 04/03/21 0426  NA 141  --  140 136 135  K 3.8  --  4.0 3.8 3.8  CL 103  --  105 107 106  CO2 25  --  25 23 18*  GLUCOSE 137*  --  98 121* 110*  BUN 18  --  15 14 16   CREATININE 0.81  --  0.97 0.88 1.01  CALCIUM 9.3  --  9.0 8.6* 8.3*  AST  --   --  25 27  --   ALT  --   --  18 17  --   ALKPHOS  --   --  69 59  --   BILITOT  --   --  0.8 0.6  --   ALBUMIN  --   --  3.2* 3.0*  --   MG  --   --   --  2.1  --   LATICACIDVEN  --  1.5  --   --   --   HGBA1C  --   --  5.5  --   --      ------------------------------------------------------------------------------------------------------------------ No results for input(s): CHOL, HDL, LDLCALC, TRIG, CHOLHDL, LDLDIRECT in the last 72 hours.  Lab Results  Component Value Date   HGBA1C 5.5 04/01/2021   ------------------------------------------------------------------------------------------------------------------ No results for input(s): TSH, T4TOTAL, T3FREE, THYROIDAB in the last 72 hours.  Invalid input(s): FREET3  Cardiac Enzymes No results for input(s): CKMB, TROPONINI, MYOGLOBIN in the last 168 hours.  Invalid input(s): CK ------------------------------------------------------------------------------------------------------------------ No results found for: BNP  CBG: No results for input(s): GLUCAP in the last 168 hours.  Recent Results (from the past 240 hour(s))  Resp Panel by RT-PCR (Flu A&B, Covid) Nasopharyngeal Swab     Status: None   Collection Time: 03/31/21  1:46 PM   Specimen: Nasopharyngeal Swab; Nasopharyngeal(NP) swabs in vial transport medium  Result Value Ref Range Status   SARS Coronavirus 2 by RT PCR NEGATIVE NEGATIVE Final    Comment: (NOTE) SARS-CoV-2 target nucleic acids are NOT  DETECTED.  The SARS-CoV-2 RNA is generally detectable in upper respiratory specimens during the acute phase of infection. The lowest concentration of SARS-CoV-2 viral copies this assay can detect is 138 copies/mL. A negative result does not preclude SARS-Cov-2 infection and should not be used as the sole basis for treatment or other patient management decisions. A negative result may occur with  improper specimen collection/handling, submission of specimen other than nasopharyngeal swab, presence of viral mutation(s) within the areas targeted by this assay, and inadequate number of viral copies(<138 copies/mL). A negative result must be combined with clinical observations, patient history, and epidemiological information. The expected result is Negative.  Fact Sheet for Patients:  EntrepreneurPulse.com.au  Fact Sheet for Healthcare Providers:  IncredibleEmployment.be  This test is no t yet approved or cleared by the [place] FDA and  has been authorized for detection and/or diagnosis of SARS-CoV-2 by FDA under an Emergency Use Authorization (EUA). This EUA will remain  in effect (meaning this test can be used) for the duration of the COVID-19 declaration under Section 564(b)(1) of the Act, 21 U.S.C.section 360bbb-3(b)(1), unless the authorization is terminated  or revoked sooner.       Influenza A by PCR NEGATIVE NEGATIVE Final   Influenza B by PCR NEGATIVE NEGATIVE Final    Comment: (NOTE) The Xpert Xpress SARS-CoV-2/FLU/RSV plus assay is intended as an aid in the diagnosis of influenza from Nasopharyngeal swab specimens and should not be used as a sole basis for treatment. Nasal washings and aspirates are unacceptable for Xpert Xpress SARS-CoV-2/FLU/RSV testing.  Fact Sheet for Patients: EntrepreneurPulse.com.au  Fact Sheet for Healthcare Providers: IncredibleEmployment.be  This test is not yet  approved or cleared by the [place] FDA and has been authorized for detection and/or diagnosis of SARS-CoV-2 by FDA under an Emergency Use Authorization (EUA). This EUA will remain in effect (meaning this test can be used) for the duration of the COVID-19 declaration under Section 564(b)(1) of the Act,  21 U.S.C. section 360bbb-3(b)(1), unless the authorization is terminated or revoked.  Performed at [facility], [place] [address], [place], [place] [address]   Culture, blood (routine x 2)     Status: None (Preliminary result)   Collection Time: 03/31/21  3:43 PM   Specimen: BLOOD LEFT FOREARM  Result Value Ref Range Status   Specimen Description BLOOD LEFT FOREARM  Final   Special Requests   Final    BOTTLES DRAWN AEROBIC AND ANAEROBIC Blood Culture results may not be optimal due to an inadequate volume of blood received in culture bottles   Culture   Final    NO GROWTH 4 DAYS Performed at [facility], [place] [address] [place], [place] [address]    Report Status PENDING  Incomplete      Radiology Studies: No results found.   Marzetta Board, MD, PhD [facility]  Between 7 am - 7 pm I am available, please contact me via Amion (for emergencies) or Securechat (non urgent messages)  Between 7 pm - 7 am I am not available, please contact night coverage MD/APP via Amion

## 2021-04-05 ENCOUNTER — Inpatient Hospital Stay (HOSPITAL_COMMUNITY): Payer: Medicare Other

## 2021-04-05 ENCOUNTER — Encounter (HOSPITAL_COMMUNITY): Payer: Self-pay | Admitting: Vascular Surgery

## 2021-04-05 DIAGNOSIS — I2510 Atherosclerotic heart disease of native coronary artery without angina pectoris: Secondary | ICD-10-CM

## 2021-04-05 DIAGNOSIS — I10 Essential (primary) hypertension: Secondary | ICD-10-CM

## 2021-04-05 DIAGNOSIS — Z01810 Encounter for preprocedural cardiovascular examination: Secondary | ICD-10-CM

## 2021-04-05 DIAGNOSIS — L03119 Cellulitis of unspecified part of limb: Secondary | ICD-10-CM | POA: Diagnosis not present

## 2021-04-05 DIAGNOSIS — I70223 Atherosclerosis of native arteries of extremities with rest pain, bilateral legs: Secondary | ICD-10-CM

## 2021-04-05 DIAGNOSIS — Z951 Presence of aortocoronary bypass graft: Secondary | ICD-10-CM

## 2021-04-05 DIAGNOSIS — E782 Mixed hyperlipidemia: Secondary | ICD-10-CM

## 2021-04-05 LAB — BASIC METABOLIC PANEL
Anion gap: 9 (ref 5–15)
BUN: 13 mg/dL (ref 8–23)
CO2: 21 mmol/L — ABNORMAL LOW (ref 22–32)
Calcium: 8.1 mg/dL — ABNORMAL LOW (ref 8.9–10.3)
Chloride: 106 mmol/L (ref 98–111)
Creatinine, Ser: 0.88 mg/dL (ref 0.61–1.24)
GFR, Estimated: >60 mL/min (ref >60)
Glucose, Bld: 96 mg/dL (ref 70–99)
Potassium: 3.6 mmol/L (ref 3.5–5.1)
Sodium: 136 mmol/L (ref 135–145)

## 2021-04-05 LAB — LIPID PANEL
Cholesterol: 111 mg/dL (ref 0–200)
HDL: 31 mg/dL — ABNORMAL LOW (ref >40)
LDL Cholesterol: 71 mg/dL (ref 0–99)
Total CHOL/HDL Ratio: 3.6 RATIO
Triglycerides: 44 mg/dL (ref <150)
VLDL: 9 mg/dL (ref 0–40)

## 2021-04-05 LAB — CULTURE, BLOOD (ROUTINE X 2): Culture: NO GROWTH

## 2021-04-05 LAB — CBC
HCT: 41.5 % (ref 39.0–52.0)
Hemoglobin: 14 g/dL (ref 13.0–17.0)
MCH: 30.2 pg (ref 26.0–34.0)
MCHC: 33.7 g/dL (ref 30.0–36.0)
MCV: 89.4 fL (ref 80.0–100.0)
Platelets: 168 10*3/uL (ref 150–400)
RBC: 4.64 MIL/uL (ref 4.22–5.81)
RDW: 11.6 % (ref 11.5–15.5)
WBC: 7.7 10*3/uL (ref 4.0–10.5)
nRBC: 0 % (ref 0.0–0.2)

## 2021-04-05 MED ORDER — POTASSIUM CHLORIDE CRYS ER 20 MEQ PO TBCR
40.0000 meq | EXTENDED_RELEASE_TABLET | Freq: Once | ORAL | Status: AC
  Administered 2021-04-05: 16:00:00 40 meq via ORAL
  Filled 2021-04-05: qty 2

## 2021-04-05 MED ORDER — TECHNETIUM TC 99M MEDRONATE IV KIT
20.0000 | PACK | Freq: Once | INTRAVENOUS | Status: AC | PRN
  Administered 2021-04-05: 12:00:00 21.3 via INTRAVENOUS

## 2021-04-05 NOTE — Progress Notes (Signed)
Right lower extremity vein mapping completed. Refer to "CV Proc" under chart review to view preliminary results.  04/05/2021 11:23 AM Kelby Aline., MHA, RVT, RDCS, RDMS

## 2021-04-05 NOTE — Progress Notes (Signed)
°  Progress Note    04/05/2021 8:09 AM 1 Day Post-Op  Subjective:  no complaints this morning.  Minimal soreness L groin cath site   Vitals:   04/04/21 2348 04/05/21 0406  BP: 128/78 122/71  Pulse: 72 72  Resp:    Temp: 98.3 F (36.8 C) 97.7 F (36.5 C)  SpO2: 95% 94%   Physical Exam: Lungs:  non labored Incisions:  L groin cath site without hematoma Extremities:  R GT necrotic with erythematous R foot Neurologic: a&O  CBC    Component Value Date/Time   WBC 7.7 04/05/2021 0401   RBC 4.64 04/05/2021 0401   HGB 14.0 04/05/2021 0401   HCT 41.5 04/05/2021 0401   PLT 168 04/05/2021 0401   MCV 89.4 04/05/2021 0401   MCH 30.2 04/05/2021 0401   MCHC 33.7 04/05/2021 0401   RDW 11.6 04/05/2021 0401   LYMPHSABS 0.9 03/31/2021 1351   MONOABS 0.4 03/31/2021 1351   EOSABS 0.0 03/31/2021 1351   BASOSABS 0.0 03/31/2021 1351    BMET    Component Value Date/Time   NA 136 04/05/2021 0401   NA 140 07/08/2019 0932   K 3.6 04/05/2021 0401   CL 106 04/05/2021 0401   CO2 21 (L) 04/05/2021 0401   GLUCOSE 96 04/05/2021 0401   BUN 13 04/05/2021 0401   BUN 21 07/08/2019 0932   CREATININE 0.88 04/05/2021 0401   CALCIUM 8.1 (L) 04/05/2021 0401   GFRNONAA >60 04/05/2021 0401   GFRAA 89 07/08/2019 0932    INR No results found for: INR   Intake/Output Summary (Last 24 hours) at 04/05/2021 0809 Last data filed at 04/05/2021 0500 Gross per 24 hour  Intake 1340 ml  Output 1950 ml  Net -610 ml     Assessment/Plan:  88 y.o. male is s/p diagnostic angiography 1 Day Post-Op   L groin cath site without hematoma No endovascular options based on angiography; he will be considered for R femoral to ATA bypass.  Vein mapping ordered although he believes RLE saphenous was harvested for CABG in 2006.  He will also likely need to be evaluated by Cardiology for risk stratification.   Dagoberto Ligas, PA-C [facility] [phone] 04/05/2021 8:09 AM

## 2021-04-05 NOTE — Progress Notes (Signed)
Physical Therapy Treatment Patient Details Name: [name] MRN: [id number] DOB: [birth year] Today's Date: 04/05/2021   History of Present Illness 88 y.o. male presents to ED with RLE wound being followed by outpatient podiatry. Increased pain over last 3-4 days, and outpatient podiatrist sent to ED 03/31/21. Foot x-ray shows concern for soft tissue gas/infection with a gas-forming organism. 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 s/p R common femoral artery and RLE angiogram, LLE angiography 1/23; pt to be considered for R femoral to anterior tibial artery bypass. PMH: coronary artery disease, ischemic cardiomyopathy with chronic systolic CHF with prior EF of 25%, history of CABG in 2006, a flutter status post ablation in 2014, VT, also sick sinus syndrome status post pacemaker.    PT Comments    Pt reporting moderate pain in RLE especially, but also LLE during dynamic standing. Pt overall requiring min assist for transfer-level mobility at this time, requires max form/safety cues with use of RW. Pt tolerated x2 stand and pivots during session, had to be back in bed for nuclear medicine. Pt with weeping from chronic R toe ulceration, RN called to room to assess/address. PT to continue to follow and will progress mobility as able.    Recommendations for follow up therapy are one component of a multi-disciplinary discharge planning process, led by the attending physician.  Recommendations may be updated based on patient status, additional functional criteria and insurance authorization.  Follow Up Recommendations  Home health PT     Assistance Recommended at Discharge    Patient can return home with the following A little help with walking and/or transfers;A little help with bathing/dressing/bathroom;Assistance with cooking/housework;Assist for transportation;Help with stairs or ramp for entrance   Equipment  Recommendations  Rolling walker (2 wheels);BSC/3in1    Recommendations for Other Services       Precautions / Restrictions Precautions Precautions: Fall Precaution Comments: R toe chronic wound - check for weeping, encouraged more heel WB during mobility Restrictions Weight Bearing Restrictions: No     Mobility  Bed Mobility Overal bed mobility: Needs Assistance Bed Mobility: Supine to Sit, Sit to Supine     Supine to sit: Supervision, HOB elevated Sit to supine: Supervision, HOB elevated   General bed mobility comments: for safety, increased time with use of bedrails to assist    Transfers Overall transfer level: Needs assistance Equipment used: Rolling walker (2 wheels) Transfers: Sit to/from Stand Sit to Stand: Min assist   Step pivot transfers: Min assist       General transfer comment: assist for power up and steadying, pivotal steps. Cues for placement in RW, upright posture, stepping. STS and step pivot x2, to and from recliner as pt had to go back to bed for nuclear medicine.    Ambulation/Gait               General Gait Details: deferred due to pain   Stairs             Wheelchair Mobility    Modified Rankin (Stroke Patients Only)       Balance Overall balance assessment: Needs assistance Sitting-balance support: Feet supported, No upper extremity supported Sitting balance-Leahy Scale: Good     Standing balance support: Single extremity supported, Bilateral upper extremity supported, Reliant on assistive device for balance Standing balance-Leahy Scale: Poor  Cognition Arousal/Alertness: Awake/alert Behavior During Therapy: Flat affect Overall Cognitive Status: Impaired/Different from baseline Area of Impairment: Awareness, Problem solving, Following commands, Safety/judgement                       Following Commands: Follows one step commands with increased  time Safety/Judgement: Decreased awareness of safety, Decreased awareness of deficits Awareness: Intellectual Problem Solving: Difficulty sequencing, Requires verbal cues, Requires tactile cues General Comments: pt states he has not gotten up in days, mobilized with PT and OT 2 and 1 day ago respectively. Pt requires step-by-step cuing for safe mobility with RW today, max cues for keeping RW close to self        Exercises General Exercises - Lower Extremity Long Arc Quad: AROM, Both, 10 reps, Seated    General Comments General comments (skin integrity, edema, etc.): VSS, redness noted bilat LEs R>L, R chronic toe wound weeping and RN called to room to assess and address      Pertinent Vitals/Pain Pain Assessment Pain Assessment: Faces Faces Pain Scale: Hurts even more Pain Location: bilat feet R>L Pain Descriptors / Indicators: Throbbing, Sharp, Aching, Discomfort, Guarding, Grimacing Pain Intervention(s): Limited activity within patient's tolerance, Monitored during session, Repositioned    Home Living                          Prior Function            PT Goals (current goals can now be found in the care plan section) Acute Rehab PT Goals Patient Stated Goal: go home PT Goal Formulation: With patient Time For Goal Achievement: 04/17/21 Potential to Achieve Goals: Good Progress towards PT goals: Progressing toward goals    Frequency    Min 3X/week      PT Plan Current plan remains appropriate    Co-evaluation              AM-PAC PT "6 Clicks" Mobility   Outcome Measure  Help needed turning from your back to your side while in a flat bed without using bedrails?: A Little Help needed moving from lying on your back to sitting on the side of a flat bed without using bedrails?: A Little Help needed moving to and from a bed to a chair (including a wheelchair)?: A Little Help needed standing up from a chair using your arms (e.g., wheelchair or bedside  chair)?: A Little Help needed to walk in hospital room?: A Lot Help needed climbing 3-5 steps with a railing? : A Lot 6 Click Score: 16    End of Session   Activity Tolerance: Patient limited by pain Patient left: in bed;with call bell/phone within reach;with bed alarm set;with nursing/sitter in room Nurse Communication: Mobility status PT Visit Diagnosis: Unsteadiness on feet (R26.81);Other abnormalities of gait and mobility (R26.89);Muscle weakness (generalized) (M62.81);Difficulty in walking, not elsewhere classified (R26.2);Pain     Time: [id number] PT Time Calculation (min) (ACUTE ONLY): 23 min  Charges:  $Therapeutic Activity: 8-22 mins                     Stacie Glaze, PT DPT Acute Rehabilitation Services Pager [phone]  Office [phone]    Roxine Caddy E Ruffin Pyo 04/05/2021, 3:08 PM

## 2021-04-05 NOTE — Assessment & Plan Note (Signed)
-   Has not been taking statin at home.  LDL at 71

## 2021-04-05 NOTE — Assessment & Plan Note (Signed)
Noted  

## 2021-04-05 NOTE — Consult Note (Addendum)
Cardiology Consultation:   Patient ID: MCALLISTER DALESIO MRN: [id number]; DOB: 03/24/1933  Admit date: 03/31/2021 Date of Consult: 04/05/2021  PCP:  Raina Mina., MD   [facility] Providers Cardiologist:  Shirlee More, MD  Electrophysiologist:  Constance Haw, MD       Patient Profile:   [name] is a 88 y.o. male with a hx of CAD with anterior STEMI s/p CABG 2006, ICM/chronic HFrEF, RBBB/LAFB and SSS s/p Biotronik PPM, NSVT, PVCs, HTN, HLD (pt previously declined rx), GERD, paroxysmal atrial flutter s/p ablation 2014, chronic dizziness (felt inner ear in the past) who is being seen 04/05/2021 for the evaluation of pre-operative evaluation at the request of Dagoberto Ligas PA-C.  History of Present Illness:   [name] remotely followed with Dr. Ola Spurr at an outside office and had a successful atrial flutter ablation in 2014 so has not been on anticoagulation. He more recently has followed with Dr. Bettina Gavia (last OV 06/2019) and Dr. Curt Bears (last OV 12/2020) with history outlined above. We do not have a prior echo on file. Notes from 2006 around time of CABG/MI reference EF 35% with akinesis-to-dyskinesis of the distal anterior wall and apex. Dr. Joya Gaskins last note referenced EF 45% and Dr. Macky Lower note referenced EF 25%. There are an echo and nuclear stress test in 2018 in [place] without attached report, however, Dr. Blane Ohara note around that time stated that nuclear stress test had shown large inferior infarct with ejection fraction 37% but echo done around that same time showed higher ejection fraction and similar wall motion abnormality (EF 40-45%). These were done at the time for NSVT/PVCs. He has not been on any significant cardiac medications as he discontinued previous medication as he just does not like to take pills. He declines to take statins. He was previously on Zetia but is no longer on this either. He states he just doesn't want to take anything unless he  has to (exception being oxycodone because he states it helps with his chronic dizziness).  He has been dealing with a right lower extremity callus for several weeks and has been managed by podiatry. However, in the days leading to admission he began to have increased right foot pain, swelling, and redness. He saw his podiatrist back who sent him to the ER. He was subsequently admitted for right foot cellulitis and abscess and was placed on antibiotics. ABIs were suggestive of critical limb ischemia. He underwent PV angio with full results with occlusive PAD with occlded right SFA felt to benefit from right femoral to anterior tibial artery bypass. Labs are notable for mild hyperglycemia (normal A1C), normal Cr, slightly decreased albumin, LDL 71, normal CBC. 2D echo 04/01/21 showed mild-moderate hypokinesis with akinesis of the anterior, anteroseptal wall and apex c/w LAD disease, EF 40-45%, grade 1 DD, dilated RA/LA, mild LVH, mild AI. Cardiology asked to see for preop clearance. [name] states he "thought my heart's been doing good recently." He has not had any CP, SOB, palpitations, orthopnea, or lasting fluid gain. He recalls seeing some swelling in his legs a few weeks ago but this resolved without intervention. He is unable to achieve 4 METS as his activity has been limited by his foot pain.   Past Medical History:  Diagnosis Date   Aortic atherosclerosis ([place]) 02/16/2016   BMI 31.0-31.9,adult 05/05/2015   CAD in native artery 02/14/2015   Chronic rhinitis 05/05/2015   Elevated PSA 05/05/2015   [facility]   Essential hypertension 02/14/2015  GERD (gastroesophageal reflux disease) 05/05/2015   History of cardiac radiofrequency ablation 09/15/2016   Hx of CABG 09/15/2016   Hyperlipidemia 02/14/2015   Ischemic cardiomyopathy    Lumbar degenerative disc disease 05/05/2015   Partial small bowel obstruction (HCC) 03/05/2017   Prediabetes 05/05/2015   Presence of cardiac pacemaker    Biotronik    Primary osteoarthritis involving multiple joints 05/05/2015   Last Assessment & Plan:  Relevant Hx: Course: Daily Update: Today's Plan:he has known OA to his joints and he has seen chiropractor in the past for this and he states that he was told he had degenerative changes to his back several times. 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 fib for more than 1-1/2 years   Ventral hernia without obstruction or gangrene 05/05/2015    Past Surgical History:  Procedure Laterality Date   ABDOMINAL AORTOGRAM W/LOWER EXTREMITY Bilateral 04/04/2021   Procedure: ABDOMINAL AORTOGRAM W/LOWER EXTREMITY;  Surgeon: Maeola Harman, MD;  Location: [facility] INVASIVE CV LAB;  Service: Cardiovascular;  Laterality: Bilateral;   ATRIAL FLUTTER ABLATION     COLON SURGERY     CORONARY ARTERY BYPASS GRAFT     INSERT / REPLACE / REMOVE PACEMAKER       Home Medications:  Prior to Admission medications   Medication Sig Start Date End Date Taking? Authorizing Provider  acetaminophen (TYLENOL) 500 MG tablet Take 500 mg by mouth every 6 (six) hours as needed for mild pain.   Yes [provider]  hydroxypropyl methylcellulose / hypromellose (ISOPTO TEARS / GONIOVISC) 2.5 % ophthalmic solution Place 1 drop into both eyes daily as needed for dry eyes.   Yes [provider]  ibuprofen (ADVIL) 200 MG tablet Take 200 mg by mouth every 6 (six) hours as needed for mild pain.   Yes [provider]   Nutritional Supplements (PROSTATE PO) Take 1 tablet by mouth daily.   Yes [provider]    Inpatient Medications: Scheduled Meds:  aspirin EC  81 mg Oral Daily   enoxaparin (LOVENOX) injection  40 mg Subcutaneous Q24H   pantoprazole  40 mg Oral Daily   sodium chloride flush  3 mL Intravenous Q12H   tamsulosin  0.4 mg Oral QPC supper   Continuous Infusions:  sodium chloride 100 mL/hr at 04/04/21 2209   sodium chloride     piperacillin-tazobactam (ZOSYN)  IV 3.375 g (04/05/21 0803)   vancomycin 1,250 mg (04/04/21 1800)   PRN Meds: sodium chloride, acetaminophen, alum & mag hydroxide-simeth, bisacodyl, hydrALAZINE, morphine injection, ondansetron (ZOFRAN) IV, oxyCODONE, sodium chloride flush  Allergies:    Allergies  Allergen Reactions   Amiodarone Nausea And Vomiting    vomiting    Gabapentin     dizziness     Metoprolol Other (See Comments)    Drop in heart rate, dizziness.     Statins     myalgias   Doxycycline     Unknown to patient    Neomycin  Unknown to patient     Niacin     Unknown to patient    Ramipril     Unknown to patient      Social History:   Social History   Socioeconomic History   Marital status: Divorced    Spouse name: Not on file   Number of children: Not on file   Years of education: Not on file   Highest education level: Not on file  Occupational History   Not on file  Tobacco Use   Smoking status: Never   Smokeless tobacco: Never  Vaping Use   Vaping Use: Never used  Substance and Sexual Activity   Alcohol use: Never   Drug use: Never   Sexual activity: Not on file  Other Topics Concern   Not on file  Social History Narrative   Not on file   Social Determinants of Health   Financial Resource Strain: Not on file  Food Insecurity: Not on file  Transportation Needs: Not on file  Physical Activity: Not on file  Stress: Not on file  Social Connections: Not on file  Intimate Partner Violence: Not on  file    Family History:    Family History  Problem Relation Age of Onset   Diabetes Sister    Hyperlipidemia Daughter    Hypertension Daughter    Diabetes Daughter    Rheum arthritis Daughter    Hypertension Daughter    AAA (abdominal aortic aneurysm) Daughter    Migraines Daughter    Heart attack Neg Hx    Heart disease Neg Hx    Heart failure Neg Hx      ROS:  Please see the history of present illness.  All other ROS reviewed and negative.     Physical Exam/Data:   Vitals:   04/04/21 1900 04/04/21 1924 04/04/21 2348 04/05/21 0406  BP: 118/64 136/65 128/78 122/71  Pulse: 66 72 72 72  Resp:      Temp:  98.3 F (36.8 C) 98.3 F (36.8 C) 97.7 F (36.5 C)  TempSrc:  Oral Oral Oral  SpO2: 96% 95% 95% 94%  Weight:      Height:        Intake/Output Summary (Last 24 hours) at 04/05/2021 1237 Last data filed at 04/05/2021 0500 Gross per 24 hour  Intake 1340 ml  Output 1950 ml  Net -610 ml   Last 3 Weights 03/31/2021 12/27/2020 07/08/2019  Weight (lbs) 185 lb 192 lb 3.2 oz 194 lb 6.4 oz  Weight (kg) 83.915 kg 87.181 kg 88.179 kg     Body mass index is 26.54 kg/m.  General: Well developed, well nourished WM, in no acute distress. Head: Normocephalic, atraumatic, sclera non-icteric, no xanthomas, nares are without discharge. Neck: Negative for carotid bruits. JVP not elevated. Lungs: Clear bilaterally to auscultation without wheezes, rales, or rhonchi. Breathing is unlabored. Heart: RRR S1 S2 without murmurs, rubs, or gallops.  Abdomen: Soft, non-tender, non-distended with normoactive bowel sounds. No rebound/guarding. Extremities: No clubbing or cyanosis. Erythema of right foot with escar at side of great toe. Poor distal pulses Neuro: Alert and oriented X 3. Moves all extremities spontaneously. Psych:  Responds to questions appropriately with a normal affect.  EKG:  The EKG was personally reviewed and demonstrates:  atrial paced rhythm with RBBB, prior anterior  infarct, nonspecific TW changes. similar to prior.  Telemetry:  Telemetry was personally reviewed and demonstrates: primarily atrial pacing v sensed, occasionally AV pacing as well. No AF/AFL seen.  Relevant CV Studies:  2D echo 04/01/21   1. Global mild to moderate hypokinesis with akinesis of the anterior,  anteroseptal wall and apex c/w LAD disease. Left ventricular ejection  fraction, by estimation, is 40 to 45%. The left ventricle has mildly  decreased function. The left ventricle  demonstrates regional wall motion abnormalities (see scoring  diagram/findings for description). There is mild concentric left  ventricular hypertrophy. Left ventricular diastolic parameters are  indeterminate.   2. Right ventricular systolic function was not well visualized. The right  ventricular size is not well visualized.   3. Left atrial size was moderately dilated.   4. Right atrium is dilated.   5. The mitral valve is grossly normal. No evidence of mitral valve  regurgitation.   6. The aortic valve is grossly normal. Aortic valve regurgitation is  mild. Aortic valve sclerosis/calcification is present, without any  evidence of aortic stenosis.   7. The inferior vena cava not well visualized.   Comparison(s): No prior Echocardiogram.   See PV study below under Radiology  Laboratory Data:  High Sensitivity Troponin:  No results for input(s): TROPONINIHS in the last 720 hours.   Chemistry Recent Labs  Lab 04/02/21 0224 04/03/21 0426 04/05/21 0401  NA 136 135 136  K 3.8 3.8 3.6  CL 107 106 106  CO2 23 18* 21*  GLUCOSE 121* 110* 96  BUN 14 16 13   CREATININE 0.88 1.01 0.88  CALCIUM 8.6* 8.3* 8.1*  MG 2.1  --   --   GFRNONAA >60 >60 >60  ANIONGAP 6 11 9     Recent Labs  Lab 04/01/21 0314 04/02/21 0224  PROT 6.2* 5.9*  ALBUMIN 3.2* 3.0*  AST 25 27  ALT 18 17  ALKPHOS 69 59  BILITOT 0.8 0.6   Lipids  Recent Labs  Lab 04/05/21 0401  CHOL 111  TRIG 44  HDL 31*  LDLCALC  71  CHOLHDL 3.6    Hematology Recent Labs  Lab 04/02/21 0224 04/03/21 0426 04/05/21 0401  WBC 6.3 7.6 7.7  RBC 4.75 4.90 4.64  HGB 14.2 14.6 14.0  HCT 42.4 44.1 41.5  MCV 89.3 90.0 89.4  MCH 29.9 29.8 30.2  MCHC 33.5 33.1 33.7  RDW 11.5 11.6 11.6  PLT 158 167 168   Thyroid No results for input(s): TSH, FREET4 in the last 168 hours.  BNPNo results for input(s): BNP, PROBNP in the last 168 hours.  DDimer No results for input(s): DDIMER in the last 168 hours.   Radiology/Studies:  PERIPHERAL VASCULAR CATHETERIZATION  Result Date: 04/04/2021 Images from the original result were not included. Patient name: AVERELL BRINTNALL MRN: [id number] DOB: 08-19-1933 Sex: male 04/04/2021 Pre-operative Diagnosis: Chronic right lower extremity limb threatening ischemia with toe ulceration Post-operative diagnosis:  Same Surgeon:  Erlene Quan C. Donzetta Matters, MD Procedure Performed: 1.  Ultrasound-guided cannulation left common femoral artery 2.  Aortogram 3.  Selection of right common femoral artery and right lower extremity angiogram 4.  Left lower extremity angiography 5.  Moderate sedation with fentanyl and Versed for 37 minutes 6.  Mynx device closure left common femoral artery Indications: 88 year old male without previous vascular disease now has right great toe ulceration with severely decreased ABIs and toe pressures of 0.  He is indicated for aortogram with right lower extremity angiography possible intervention. Findings: Patient has tortuous external iliac arteries.  The common iliac and aorta are calcified.  Renal arteries without any stenoses.  Right SFA is flush occluded there is a large profunda  giving many collaterals.  There is an [place] of distal SFA.  He then reconstitutes intertibial artery which flows to the foot.  Posterior tibial artery appears to reconstitute but it occludes above the ankle.  On the left side his common femoral artery is calcified without any flow-limiting stenosis.  SFA is heavily  calcified but patent down to the popliteal.  Trifurcation is patent.  All tibial vessels are heavily diseased with posterior tibial being the dominant runoff. Patient will be considered for right femoral to anterior tibial artery bypass and vein mapping will be ordered.  Procedure:  The patient was identified in the holding area and taken to room 8.  The patient was then placed supine on the table and prepped and draped in the usual sterile fashion.  A time out was called.  Ultrasound was used to evaluate the left common femoral artery which was noted be patent.  The areas anesthetized 1% lidocaine cannulated micropuncture needle followed by wire and sheath.  Images saved to the permanent record.  Moderate sedation was administered with fentanyl and Versed and his vital signs were monitored throughout the case.  A Bentson wire was placed followed by 5 [place] sheath.  Omni catheter was placed at the level of L1 and aortogram was performed.  Crossover catheter was used to cross the bifurcation with Bentson wire and this was placed into the right common femoral artery and right lower extremity angiography was performed.  With the above findings we remove the catheter over the wire.  We then performed left lower extremity angiography through the existing sheath.  Minx device was then used to close the common femoral access site.  He tolerated procedure without any complication. Contrast: 90cc Brandon C. Donzetta Matters, MD [facility] Office: [phone] Pager: [phone]   VAS [place] LOWER EXTREMITY SAPHENOUS VEIN MAPPING  Result Date: 04/05/2021 LOWER EXTREMITY VEIN MAPPING Patient Name:  THADDEOUS LORINO  Date of Exam:   04/05/2021 Medical Rec #: [id number]      Accession #:    [id number] Date of Birth: 02-Sep-1933       Patient Gender: M Patient Age:   71 years Exam Location:  [facility] Procedure:      VAS [place] LOWER EXTREMITY SAPHENOUS VEIN MAPPING Referring Phys: Servando Snare  --------------------------------------------------------------------------------  Indications: Pre-op  Comparison Study: No prior study Performing Technologist: Maudry Mayhew MHA, RDMS, RVT, RDCS  Examination Guidelines: A complete evaluation includes B-mode imaging, spectral Doppler, color Doppler, and power Doppler as needed of all accessible portions of each vessel. Bilateral testing is considered an integral part of a complete examination. Limited examinations for reoccurring indications may be performed as noted. +----------------+--------------+-----------------------+  RT Diameter (cm)  RT Findings             GSV            +----------------+--------------+-----------------------+        0.80                      Saphenofemoral Junction  +----------------+--------------+-----------------------+        0.34                          Proximal thigh       +----------------+--------------+-----------------------+        0.04  Mid thigh         +----------------+--------------+-----------------------+                   not visualized      Distal thigh        +----------------+--------------+-----------------------+        0.12                               Knee            +----------------+--------------+-----------------------+        0.16                             Prox calf         +----------------+--------------+-----------------------+        0.21         branching           Mid calf          +----------------+--------------+-----------------------+        0.19                            Distal calf        +----------------+--------------+-----------------------+                   not visualized          Ankle           +----------------+--------------+-----------------------+ +----------------+-----------+---------------+  RT diameter (cm) RT Findings       SSV        +----------------+-----------+---------------+        0.10                   Popliteal fossa   +----------------+-----------+---------------+        0.13                    Proximal calf   +----------------+-----------+---------------+        0.17                      Mid calf      +----------------+-----------+---------------+        0.15        branching    Distal calf    +----------------+-----------+---------------+    Preliminary      Assessment and Plan:   1. Right foot cellulitis/abscess with PAD and critical limb ischemia - per IM/vascular/podiatry  2. Pre-op cardiovascular examination - given nature of surgery and cardiac risk factors, his RCRI is calculated at >11% indicating high CV risk with surgery - he declines any recent symptoms to suggest decompensation or increasing ischemia, however, he is unable to achieve 4 METS as he's been limited by his foot pain - will discuss further recs with MD  3. CAD s/p remote CABG 2006 with HLD (LDL 71)  - no recent angina - currently on ASA 81mg  daily - has declined to take statins in the past - he is currently undecided about ezetimibe but would continue to offer vs f/u pharmD team for [id number] as OP to goal LDL <75 - will discuss medical therapy for CAD/CHF with MD, if patient amenable  4. Ischemic cardiomyopathy with chronic HFrEF - appears euvolemic - echo with findings above - based on prior description in chart suspect EF + WMA are largely stable -  patient self-discontinued his prior regimen as he does not like to take medications - add I/O's and daily weights to track volume  5. Remote atrial flutter s/p ablation, no longer on anticoagulation - without recurrence at this time - follow on telemetry  6. SSS s/p PPM - continue usual EP outpatient follow-up  7. H/o PVCs/NSVT - quiescent, follow on telemetry - keep K 4.0 or greater, Mg 2.0 or greater - Mg OK; last K 3.6 with normal renal function so will give KCl 53meq x 1  Risk Assessment/Risk Scores:        New York Heart Association (NYHA) Functional Class NYHA  Class II  CHA2DS2-VASc Score = 5   This indicates a 7.2% annual risk of stroke. The patient's score is based upon: CHF History: 1 HTN History: 1 Diabetes History: 0 Stroke History: 0 Vascular Disease History: 1 Age Score: 2 Gender Score: 0      For questions or updates, please contact [facility] Please consult [email or web address] for contact info under    Signed, Charlie Pitter, PA-C  04/05/2021 12:37 PM

## 2021-04-05 NOTE — Progress Notes (Signed)
PROGRESS NOTE  [name] ZOX:[id number] DOB: [birth year] DOA: 03/31/2021 PCP: Gordan Payment., MD   LOS: 4 days   Brief Narrative / Interim history:  88 y.o. male with medical history significant of coronary artery disease, ischemic cardiomyopathy with chronic systolic CHF with prior EF of 81%, history of CABG in 2006, a flutter status post ablation in 2014, VT, also sick sinus syndrome status post pacemaker.  He has been dealing with a right lower extremity callus for several weeks, seen as an outpatient by podiatry.  Over the last 3 to 4 days, he has noticed increased pain to the right lower extremity, increased swelling and redness.  He has a degree of neuropathy and does not feel much.  He was seen in podiatrist office today, was felt to have significant worsening, cellulitis, and was sent to the emergency room.  Subjective / 24h Interval events: -No complaints, no chest pain, no shortness of breath.  Assessment & Plan: * Cellulitis and abscess of foot- (present on admission) - Patient admitted with right foot cellulitis and abscess.  Could not get an MRI due to pacemaker.  He has pus draining from right greater toe.  Cellulitis extends up to mid upper shins.  Placed on antibiotics. Blood cultures pending, no growth to date.  Vascular surgery consulted, underwent arteriogram 1/23, no possible intervention and he is to be considered for right femoral to anterior tibial artery bypass.  Vein mapping is pending. Podiatry consulted as well, ordered a bone scan to see whether there is any concern for osteomyelitis.  Bone scan still pending  Chronic systolic CHF (congestive heart failure) (HCC) -Last seen cardiology at [facility], Dr. Sampson Goon, in 2018.  It was mentioned that the EF at that time was 40 to 45%, and recently had a stress test which was negative.  2D echo done here shows EF 40-45% with mildly decreased LV function.  Suspect unchanged.  He has no heart failure symptoms, no angina.  Currently  he is not on goal-directed therapy, not clear when he stopped the medications but back in 2018 he was on metoprolol 12.5 twice daily as well as aspirin.  Discussed with the daughter, he has self discontinued the medication as he just does not like to take pills.  Currently appears stable.  Vascular surgery request cardiology evaluation for restratification pre-bypass surgery.  Cardiology consulted today  CAD (coronary artery disease) with history of CABG -noted, no chest pain or apparent active issues  Paroxysmal atrial flutter (HCC) - With history of ablation, now has pacemaker.  Apparently he was on Coumadin in the past, and per care everywhere notes, in 2018, his Coumadin was stopped as he had no recurrence for A. fib or flutter for 1.5 years at that time.  Urinary hesitancy - Possibly due to BPH.  This is been going on for quite some time, but most recently has noticed that he needs to urinate more often during the nighttime.  He is supposed to see urology next week.  He was started on Flomax while here, no further issues, urinating well  PAD (peripheral artery disease) (HCC) -vascular consulted.  Arteriogram as above  Macular degeneration - Noted  Sick sinus syndrome (HCC) status post pacemaker- (present on admission) - Keep on medical telemetry.  He is paced  Dizziness -Chronic   Hyperlipidemia- (present on admission) - Has not been taking statin at home.  LDL at 71  Hx of CABG - Noted  Essential hypertension- (present on admission) -Monitor blood pressure,  currently not taking any medications, has remained within acceptable parameters    Scheduled Meds:  aspirin EC  81 mg Oral Daily   enoxaparin (LOVENOX) injection  40 mg Subcutaneous Q24H   pantoprazole  40 mg Oral Daily   sodium chloride flush  3 mL Intravenous Q12H   tamsulosin  0.4 mg Oral QPC supper   Continuous Infusions:  sodium chloride 100 mL/hr at 04/04/21 2209   sodium chloride     piperacillin-tazobactam  (ZOSYN)  IV 3.375 g (04/05/21 0803)   vancomycin 1,250 mg (04/04/21 1800)   PRN Meds:.sodium chloride, acetaminophen, alum & mag hydroxide-simeth, bisacodyl, hydrALAZINE, morphine injection, ondansetron (ZOFRAN) IV, oxyCODONE, sodium chloride flush  Diet Orders (From admission, onward)     Start     Ordered   04/04/21 1611  Diet regular Room service appropriate? Yes; Fluid consistency: Thin  Diet effective now       Question Answer Comment  Room service appropriate? Yes   Fluid consistency: Thin      04/04/21 1610            DVT prophylaxis: enoxaparin (LOVENOX) injection 40 mg Start: 03/31/21 2000   Lab Results  Component Value Date   PLT 168 04/05/2021      Code Status: Full Code  Family Communication: Dara HoyerGenney, daughter [phone]  Status is: Inpatient  Level of care: Progressive  Consultants:  Vascular Podiatry   Procedures:  2D echo  Microbiology  Blood cultures - pending  Antimicrobials: Vancomycin / Zosyn / Clindamycin    Objective: Vitals:   04/04/21 1900 04/04/21 1924 04/04/21 2348 04/05/21 0406  BP: 118/64 136/65 128/78 122/71  Pulse: 66 72 72 72  Resp:      Temp:  98.3 F (36.8 C) 98.3 F (36.8 C) 97.7 F (36.5 C)  TempSrc:  Oral Oral Oral  SpO2: 96% 95% 95% 94%  Weight:      Height:        Intake/Output Summary (Last 24 hours) at 04/05/2021 1028 Last data filed at 04/05/2021 0500 Gross per 24 hour  Intake 1340 ml  Output 1950 ml  Net -610 ml    Wt Readings from Last 3 Encounters:  03/31/21 83.9 kg  12/27/20 87.2 kg  07/08/19 88.2 kg   Examination: Constitutional: NAD Eyes: lids and conjunctivae normal, no scleral icterus ENMT: mmm Neck: normal, supple Respiratory: clear to auscultation bilaterally, no wheezing, no crackles. Normal respiratory effort.  Cardiovascular: Regular rate and rhythm, no murmurs / rubs / gallops. No LE edema. Abdomen: soft, no distention, no tenderness. Bowel sounds positive.  Skin:  Erythema/cellulitis right lower extremity, no new rashes Neurologic: no focal deficits, equal strength  Data Reviewed: I have independently reviewed following labs and imaging studies  CBC Recent Labs  Lab 03/31/21 1351 04/01/21 0314 04/02/21 0224 04/03/21 0426 04/05/21 0401  WBC 8.3 7.3 6.3 7.6 7.7  HGB 14.7 14.8 14.2 14.6 14.0  HCT 43.9 44.9 42.4 44.1 41.5  PLT 177 172 158 167 168  MCV 90.9 91.6 89.3 90.0 89.4  MCH 30.4 30.2 29.9 29.8 30.2  MCHC 33.5 33.0 33.5 33.1 33.7  RDW 11.6 11.7 11.5 11.6 11.6  LYMPHSABS 0.9  --   --   --   --   MONOABS 0.4  --   --   --   --   EOSABS 0.0  --   --   --   --   BASOSABS 0.0  --   --   --   --  Recent Labs  Lab 03/31/21 1351 03/31/21 1543 04/01/21 0314 04/02/21 0224 04/03/21 0426 04/05/21 0401  NA 141  --  140 136 135 136  K 3.8  --  4.0 3.8 3.8 3.6  CL 103  --  105 107 106 106  CO2 25  --  25 23 18* 21*  GLUCOSE 137*  --  98 121* 110* 96  BUN 18  --  15 14 16 13   CREATININE 0.81  --  0.97 0.88 1.01 0.88  CALCIUM 9.3  --  9.0 8.6* 8.3* 8.1*  AST  --   --  25 27  --   --   ALT  --   --  18 17  --   --   ALKPHOS  --   --  69 59  --   --   BILITOT  --   --  0.8 0.6  --   --   ALBUMIN  --   --  3.2* 3.0*  --   --   MG  --   --   --  2.1  --   --   LATICACIDVEN  --  1.5  --   --   --   --   HGBA1C  --   --  5.5  --   --   --      ------------------------------------------------------------------------------------------------------------------ Recent Labs    04/05/21 0401  CHOL 111  HDL 31*  LDLCALC 71  TRIG 44  CHOLHDL 3.6    Lab Results  Component Value Date   HGBA1C 5.5 04/01/2021   ------------------------------------------------------------------------------------------------------------------ No results for input(s): TSH, T4TOTAL, T3FREE, THYROIDAB in the last 72 hours.  Invalid input(s): FREET3  Cardiac Enzymes No results for input(s): CKMB, TROPONINI, MYOGLOBIN in the last 168 hours.  Invalid  input(s): CK ------------------------------------------------------------------------------------------------------------------ No results found for: BNP  CBG: No results for input(s): GLUCAP in the last 168 hours.  Recent Results (from the past 240 hour(s))  Resp Panel by RT-PCR (Flu A&B, Covid) Nasopharyngeal Swab     Status: None   Collection Time: 03/31/21  1:46 PM   Specimen: Nasopharyngeal Swab; Nasopharyngeal(NP) swabs in vial transport medium  Result Value Ref Range Status   SARS Coronavirus 2 by RT PCR NEGATIVE NEGATIVE Final    Comment: (NOTE) SARS-CoV-2 target nucleic acids are NOT DETECTED.  The SARS-CoV-2 RNA is generally detectable in upper respiratory specimens during the acute phase of infection. The lowest concentration of SARS-CoV-2 viral copies this assay can detect is 138 copies/mL. A negative result does not preclude SARS-Cov-2 infection and should not be used as the sole basis for treatment or other patient management decisions. A negative result may occur with  improper specimen collection/handling, submission of specimen other than nasopharyngeal swab, presence of viral mutation(s) within the areas targeted by this assay, and inadequate number of viral copies(<138 copies/mL). A negative result must be combined with clinical observations, patient history, and epidemiological information. The expected result is Negative.  Fact Sheet for Patients:  BloggerCourse.comhttps://www.fda.gov/media/152166/download  Fact Sheet for Healthcare Providers:  SeriousBroker.ithttps://www.fda.gov/media/152162/download  This test is no t yet approved or cleared by the [place] FDA and  has been authorized for detection and/or diagnosis of SARS-CoV-2 by FDA under an Emergency Use Authorization (EUA). This EUA will remain  in effect (meaning this test can be used) for the duration of the COVID-19 declaration under Section 564(b)(1) of the Act, 21 U.S.C.section 360bbb-3(b)(1), unless the authorization is  terminated  or revoked sooner.  Influenza A by PCR NEGATIVE NEGATIVE Final   Influenza B by PCR NEGATIVE NEGATIVE Final    Comment: (NOTE) The Xpert Xpress SARS-CoV-2/FLU/RSV plus assay is intended as an aid in the diagnosis of influenza from Nasopharyngeal swab specimens and should not be used as a sole basis for treatment. Nasal washings and aspirates are unacceptable for Xpert Xpress SARS-CoV-2/FLU/RSV testing.  Fact Sheet for Patients: BloggerCourse.com  Fact Sheet for Healthcare Providers: SeriousBroker.it  This test is not yet approved or cleared by the [place] FDA and has been authorized for detection and/or diagnosis of SARS-CoV-2 by FDA under an Emergency Use Authorization (EUA). This EUA will remain in effect (meaning this test can be used) for the duration of the COVID-19 declaration under Section 564(b)(1) of the Act, 21 U.S.C. section 360bbb-3(b)(1), unless the authorization is terminated or revoked.  Performed at [facility], [address], [place], [place] [address]   Culture, blood (routine x 2)     Status: None   Collection Time: 03/31/21  3:43 PM   Specimen: BLOOD LEFT FOREARM  Result Value Ref Range Status   Specimen Description BLOOD LEFT FOREARM  Final   Special Requests   Final    BOTTLES DRAWN AEROBIC AND ANAEROBIC Blood Culture results may not be optimal due to an inadequate volume of blood received in culture bottles   Culture   Final    NO GROWTH 5 DAYS Performed at [facility], [address], [place], [place] [address]    Report Status 04/05/2021 FINAL  Final      Radiology Studies: PERIPHERAL VASCULAR CATHETERIZATION  Result Date: 04/04/2021 Images from the original result were not included. Patient name: [name] MRN: [id number] DOB: 1933-06-09 Sex: male 04/04/2021 Pre-operative Diagnosis: Chronic right lower extremity limb threatening ischemia with toe ulceration  Post-operative diagnosis:  Same Surgeon:  Apolinar Junes C. Randie Heinz, MD Procedure Performed: 1.  Ultrasound-guided cannulation left common femoral artery 2.  Aortogram 3.  Selection of right common femoral artery and right lower extremity angiogram 4.  Left lower extremity angiography 5.  Moderate sedation with fentanyl and Versed for 37 minutes 6.  Mynx device closure left common femoral artery Indications: 88 year old male without previous vascular disease now has right great toe ulceration with severely decreased ABIs and toe pressures of 0.  He is indicated for aortogram with right lower extremity angiography possible intervention. Findings: Patient has tortuous external iliac arteries.  The common iliac and aorta are calcified.  Renal arteries without any stenoses.  Right SFA is flush occluded there is a large profunda giving many collaterals.  There is an [place] of distal SFA.  He then reconstitutes intertibial artery which flows to the foot.  Posterior tibial artery appears to reconstitute but it occludes above the ankle.  On the left side his common femoral artery is calcified without any flow-limiting stenosis.  SFA is heavily calcified but patent down to the popliteal.  Trifurcation is patent.  All tibial vessels are heavily diseased with posterior tibial being the dominant runoff. Patient will be considered for right femoral to anterior tibial artery bypass and vein mapping will be ordered.  Procedure:  The patient was identified in the holding area and taken to room 8.  The patient was then placed supine on the table and prepped and draped in the usual sterile fashion.  A time out was called.  Ultrasound was used to evaluate the left common femoral artery which was noted be patent.  The  areas anesthetized 1% lidocaine cannulated micropuncture needle followed by wire and sheath.  Images saved to the permanent record.  Moderate sedation was administered with fentanyl and Versed and his vital signs were monitored  throughout the case.  A Bentson wire was placed followed by 5 [place] sheath.  Omni catheter was placed at the level of L1 and aortogram was performed.  Crossover catheter was used to cross the bifurcation with Bentson wire and this was placed into the right common femoral artery and right lower extremity angiography was performed.  With the above findings we remove the catheter over the wire.  We then performed left lower extremity angiography through the existing sheath.  Minx device was then used to close the common femoral access site.  He tolerated procedure without any complication. Contrast: 90cc Brandon C. Randie Heinz, MD [facility] [place] Office: [phone] Pager: [phone]     Pamella Pert, MD, PhD [facility]  Between 7 am - 7 pm I am available, please contact me via Amion (for emergencies) or Securechat (non urgent messages)  Between 7 pm - 7 am I am not available, please contact night coverage MD/APP via Amion

## 2021-04-05 NOTE — Progress Notes (Addendum)
PHARMACIST LIPID MONITORING   [name] is a 88 y.o. male admitted on 03/31/2021 with cellulitis.  Pharmacy has been consulted to optimize lipid-lowering therapy with the indication of secondary prevention for clinical ASCVD.  Recent Labs:  Lipid Panel (last 6 months):   Lab Results  Component Value Date   CHOL 111 04/05/2021   TRIG 44 04/05/2021   HDL 31 (L) 04/05/2021   CHOLHDL 3.6 04/05/2021   VLDL 9 04/05/2021   LDLCALC 71 04/05/2021    Hepatic function panel (last 6 months):   Lab Results  Component Value Date   AST 27 04/02/2021   ALT 17 04/02/2021   ALKPHOS 59 04/02/2021   BILITOT 0.6 04/02/2021    SCr (since admission):   Serum creatinine: 0.88 mg/dL 42/68/34 1962 Estimated creatinine clearance: 63.4 mL/min  Current therapy and lipid therapy tolerance Current lipid-lowering therapy: none (was on statin in the past for CAD, but taken off d/t myalgias) Previous lipid-lowering therapies (if applicable): N/A, patient doesn't remember  Assessment:   Patient prefers no changes in lipid-lowering therapy at this time due to intolerance  Plan:    1.Statin intensity (high intensity recommended for all patients regardless of the LDL):  Statin intolerance noted. No statin changes due to serious side effects (ex. Myalgias with at least 2 different statins).  Patient prefers not to take medications; LDL 71 without statin.  2.Add ezetimibe (if any one of the following):   Not indicated at this time.  3.Refer to lipid clinic:   No  4.Follow-up with:  Primary care provider - Gordan Payment., MD  5.Follow-up labs after discharge:  No changes in lipid therapy, repeat a lipid panel in one year.      Laqueena Hinchey D. Laney Potash, PharmD, BCPS, BCCCP 04/05/2021, 10:23 AM

## 2021-04-06 DIAGNOSIS — S91101A Unspecified open wound of right great toe without damage to nail, initial encounter: Secondary | ICD-10-CM | POA: Diagnosis not present

## 2021-04-06 DIAGNOSIS — I70291 Other atherosclerosis of native arteries of extremities, right leg: Secondary | ICD-10-CM | POA: Diagnosis not present

## 2021-04-06 LAB — CBC
HCT: 38.9 % — ABNORMAL LOW (ref 39.0–52.0)
Hemoglobin: 12.9 g/dL — ABNORMAL LOW (ref 13.0–17.0)
MCH: 29.8 pg (ref 26.0–34.0)
MCHC: 33.2 g/dL (ref 30.0–36.0)
MCV: 89.8 fL (ref 80.0–100.0)
Platelets: 174 10*3/uL (ref 150–400)
RBC: 4.33 MIL/uL (ref 4.22–5.81)
RDW: 11.6 % (ref 11.5–15.5)
WBC: 8 10*3/uL (ref 4.0–10.5)
nRBC: 0 % (ref 0.0–0.2)

## 2021-04-06 LAB — BASIC METABOLIC PANEL
Anion gap: 7 (ref 5–15)
BUN: 10 mg/dL (ref 8–23)
CO2: 21 mmol/L — ABNORMAL LOW (ref 22–32)
Calcium: 8.4 mg/dL — ABNORMAL LOW (ref 8.9–10.3)
Chloride: 108 mmol/L (ref 98–111)
Creatinine, Ser: 0.86 mg/dL (ref 0.61–1.24)
GFR, Estimated: >60 mL/min (ref >60)
Glucose, Bld: 103 mg/dL — ABNORMAL HIGH (ref 70–99)
Potassium: 3.7 mmol/L (ref 3.5–5.1)
Sodium: 136 mmol/L (ref 135–145)

## 2021-04-06 LAB — VANCOMYCIN, PEAK: Vancomycin Pk: 28 ug/mL — ABNORMAL LOW (ref 30–40)

## 2021-04-06 NOTE — Progress Notes (Addendum)
°  Progress Note    04/06/2021 7:54 AM 2 Days Post-Op  Subjective:  occasional pain in R GT; no fevers, chills, N/V   Vitals:   04/05/21 2324 04/06/21 0355  BP: (!) 128/59 (!) 152/68  Pulse: 65 66  Resp: 18 17  Temp: 98.3 F (36.8 C) 97.6 F (36.4 C)  SpO2: 90% 100%   Physical Exam: Lungs:  non labored Incisions:  L groin cath site without hematoma Extremities:  R foot erythema and necrotic wound R GT Neurologic: A&O  CBC    Component Value Date/Time   WBC 8.0 04/06/2021 0422   RBC 4.33 04/06/2021 0422   HGB 12.9 (L) 04/06/2021 0422   HCT 38.9 (L) 04/06/2021 0422   PLT 174 04/06/2021 0422   MCV 89.8 04/06/2021 0422   MCH 29.8 04/06/2021 0422   MCHC 33.2 04/06/2021 0422   RDW 11.6 04/06/2021 0422   LYMPHSABS 0.9 03/31/2021 1351   MONOABS 0.4 03/31/2021 1351   EOSABS 0.0 03/31/2021 1351   BASOSABS 0.0 03/31/2021 1351    BMET    Component Value Date/Time   NA 136 04/06/2021 0422   NA 140 07/08/2019 0932   K 3.7 04/06/2021 0422   CL 108 04/06/2021 0422   CO2 21 (L) 04/06/2021 0422   GLUCOSE 103 (H) 04/06/2021 0422   BUN 10 04/06/2021 0422   BUN 21 07/08/2019 0932   CREATININE 0.86 04/06/2021 0422   CALCIUM 8.4 (L) 04/06/2021 0422   GFRNONAA >60 04/06/2021 0422   GFRAA 89 07/08/2019 0932    INR No results found for: INR   Intake/Output Summary (Last 24 hours) at 04/06/2021 0754 Last data filed at 04/06/2021 0403 Gross per 24 hour  Intake --  Output 600 ml  Net -600 ml     Assessment/Plan:  88 y.o. male is s/p RLE arteriogram 2 Days Post-Op   No endovascular options based on angiogram Cardiology consulted for risk stratification for bypass surgery He is being considered for R femoral to ATA bypass R saphenous vein is inadequate for conduit use Dr. Virl Cagey will further discuss bypass surgery with the patient   Dagoberto Ligas, PA-C [facility] [phone] 04/06/2021 7:54 AM  VASCULAR STAFF ADDENDUM: I have independently  interviewed and examined the patient. I agree with the above.   Doing well this morning, no complaints Intermittent pain of the right great toe No osteomyelitis of the right great toe on MRI Patient needs nuclear stress test on 1/26 versus 1/27. I have rescheduled his surgery for Monday, 1/30.  The patient does not have usable vein, and therefore PTFE will be used as the conduit. Being that he does not have osteomyelitis, will discuss plans with podiatry prior to or He is aware this is a high risk surgery, and that plastic conduit has a higher failure rate.  Unfortunately, his only options are amputation or bypass. I will call his daughter today regarding the above.  Cassandria Santee, MD [facility] Phone Number: [phone] 04/06/2021 10:33 AM

## 2021-04-06 NOTE — Progress Notes (Signed)
Progress Note   Patient: [name] OZH:[id number] DOB: 1933/04/04 DOA: 03/31/2021     5 DOS: the patient was seen and examined on 04/06/2021   Brief hospital course:  88 y.o. male with medical history significant of coronary artery disease, ischemic cardiomyopathy with chronic systolic CHF with prior EF of 62%, history of CABG in 2006, a flutter status post ablation in 2014, VT, also sick sinus syndrome status post pacemaker.  He has been dealing with a right lower extremity callus for several weeks, seen as an outpatient by podiatry.  Over the last 3 to 4 days, he has noticed increased pain to the right lower extremity, increased swelling and redness.  He has a degree of neuropathy and does not feel much.  He was seen in podiatrist office and was sent to the hospital.  Vascular surgery as well as podiatry consulted, arteriogram shows significant right lower extremity arterial disease now being considered for bypass surgery   Assessment and Plan * Cellulitis and abscess of foot- (present on admission) - Patient admitted with right foot cellulitis and abscess.  - Could not get an MRI due to pacemaker.  - pus draining from right greater toe/Cellulitis extends up to mid upper shins.  -antibiotics.  -Blood cultures: no growth to date.   -Vascular surgery consulted, underwent arteriogram 1/23, no possible intervention and he is to be considered for right femoral to anterior tibial artery bypass.  Pending cardiac evaluation prior to surgery.  Stress test unable to be done until 1/27 -Podiatry consulted as well, ordered a bone scan to see whether there is any concern for osteomyelitis.  Bone scan does not show osteomyelitis  Critical limb ischemia of both lower extremities [facility]) Per vascular: rescheduled his surgery for Monday, 1/30.  The patient does not have usable vein, and therefore PTFE will be used as the conduit. Being that he does not have osteomyelitis, will discuss plans with podiatry prior  to or He is aware this is a high risk surgery, and that plastic conduit has a higher failure rate.  Unfortunately, his only options are amputation or bypass.  Chronic systolic CHF (congestive heart failure) (HCC) -Last seen cardiology at [facility], Dr. Sampson Goon, in 2018.  It was mentioned that the EF at that time was 40 to 45%, and recently had a stress test which was negative.  2D echo done here shows EF 40-45% with mildly decreased LV function.  Suspect unchanged.  He has no heart failure symptoms, no angina.  Currently he is not on goal-directed therapy, not clear when he stopped the medications but back in 2018 he was on metoprolol 12.5 twice daily as well as aspirin.  Discussed with the daughter, he has self discontinued the medication as he just does not like to take pills.  Currently appears stable.  Vascular surgery request cardiology evaluation for restratification pre-bypass surgery.  -see above re: stress test 1/27  Urinary hesitancy - Possibly due to BPH.  This is been going on for quite some time, but most recently has noticed that he needs to urinate more often during the nighttime.  He is supposed to see urology outpatient - started on Flomax while here, no further issues, urinating well  Paroxysmal atrial flutter (HCC) - With history of ablation, now has pacemaker.  Apparently he was on Coumadin in the past, and per care everywhere notes, in 2018, his Coumadin was stopped as he had no recurrence for A. fib or flutter for 1.5 years at that time.  CAD (coronary artery disease) with history of CABG -noted, no chest pain or apparent active issues -stress test planned for 1/27  Macular degeneration - Noted  Dizziness -Chronic   Hyperlipidemia- (present on admission) - Has not been taking statin at home.  LDL at 71  Hx of CABG - Noted  PAD (peripheral artery disease) (HCC) -vascular consulted.  Arteriogram as above  Sick sinus syndrome (HCC) status post pacemaker- (present on  admission) - Keep on medical telemetry.  He is paced     Subjective: worried about losing his leg  Objective Vitals with BMI 04/06/2021 04/06/2021 04/06/2021  Height - - -  Weight - - 193 lbs 2 oz  BMI - - 27.71  Systolic 114 141 -  Diastolic 54 68 -  Pulse 65 61 -     General: Appearance:     Overweight male in no acute distress     Lungs:     respirations unlabored  Heart:    Normal heart rate.   MS:   All extremities are intact.    Neurologic:   Awake, alert, oriented x 3. No apparent focal neurological           defect.      Data Reviewed: Bone scan does not show osteomyelitis CBC/bmp unremarkable    Disposition: Status is: Inpatient  Remains inpatient appropriate because: stress test then vascular intervention      Time spent: 35 minutes  Author: Joseph Art, DO 04/06/2021 11:58 AM  For on call review [email or web address].

## 2021-04-06 NOTE — Progress Notes (Signed)
Given that Mr. Vences had a nuclear scan late in the day on 1/24, he has to wait 48 hours before getting another nuclear test.  We cannot do stress test in the evening.  Therefore we will arrange for him to have a Lexiscan Myoview in the morning on 1/27.  We will follow peripherally until this has resulted.  Please call if there are questions.  Koven Belinsky C. Duke Salvia, MD, [facility] 04/06/2021 10:55 AM

## 2021-04-06 NOTE — Progress Notes (Signed)
Received notification from nursing that nuc med reviewed plan for stress test and is OK with patient proceeding tomorrow afternoon instead of Fri. Orders adjusted.

## 2021-04-06 NOTE — Progress Notes (Signed)
Occupational Therapy Treatment Patient Details Name: [name] Wasilewski MRN: [id number] DOB: 09/23/1933 Today's Date: 04/06/2021   History of present illness 88 y.o. male presents to ED with RLE wound being followed by outpatient podiatry. Increased pain over last 3-4 days, and outpatient podiatrist sent to ED 03/31/21. Foot x-ray shows concern for soft tissue gas/infection with a gas-forming organism. 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 s/p R common femoral artery and RLE angiogram, LLE angiography 1/23; pt to be considered for R femoral to anterior tibial artery bypass. PMH: coronary artery disease, ischemic cardiomyopathy with chronic systolic CHF with prior EF of 78%25%, history of CABG in 2006, a flutter status post ablation in 2014, VT, also sick sinus syndrome status post pacemaker.   OT comments  OT treatment session with focus on bed mobility, blocked practice for sit to stand transfers and short-distance functional mobility with use of RW. Patient continues to be limited by pain in RLE>LLE and in R knee. Would benefit from continued acute OT services in prep for safe d/c home with family. Continued recommendation for HHOT.    Recommendations for follow up therapy are one component of a multi-disciplinary discharge planning process, led by the attending physician.  Recommendations may be updated based on patient status, additional functional criteria and insurance authorization.    Follow Up Recommendations  Home health OT    Assistance Recommended at Discharge Intermittent Supervision/Assistance  Patient can return home with the following  A little help with walking and/or transfers;A little help with bathing/dressing/bathroom;Assistance with feeding;Assistance with cooking/housework;Assist for transportation;Help with stairs or ramp for entrance   Equipment Recommendations  Tub/shower seat     Recommendations for Other Services      Precautions / Restrictions Precautions Precautions: Fall Precaution Comments: R toe chronic wound - check for weeping, encouraged more heel WB during mobility; HOH Restrictions Weight Bearing Restrictions: No       Mobility Bed Mobility Overal bed mobility: Needs Assistance Bed Mobility: Supine to Sit     Supine to sit: Supervision, HOB elevated Sit to supine: Supervision, HOB elevated   General bed mobility comments: Use of bed features and supervision A for safety.    Transfers Overall transfer level: Needs assistance Equipment used: Rolling walker (2 wheels) Transfers: Sit to/from Stand Sit to Stand: Min assist, Min guard     Step pivot transfers: Min guard     General transfer comment: Min A for sit to stand from low surfaces and Min gaurd from elevated surfaces. Blocked practice for sit to stands from elevated EOB x5 with Min guard progressing to supervision A. Cues for heel WB on RLE.     Balance Overall balance assessment: Needs assistance Sitting-balance support: Feet supported, No upper extremity supported Sitting balance-Leahy Scale: Fair Sitting balance - Comments: Guarding needed with dynamic balance at EOB to prevent LOB.   Standing balance support: Single extremity supported, Bilateral upper extremity supported, Reliant on assistive device for balance Standing balance-Leahy Scale: Poor                             ADL either performed or assessed with clinical judgement   ADL Overall ADL's : Needs assistance/impaired       Grooming Details (indicate cue type and reason): Politely declined             Lower Body Dressing:  Sit to/from stand;Moderate assistance Lower Body Dressing Details (indicate cue type and reason): Assist to don R sock seated EOB. Able to don Cameron sock in figure-4 position seated EOB. Assist for steadying to prevent posterior/lateral LOB.               General ADL  Comments: Pt limited by pain and swelling in RLE    Extremity/Trunk Assessment              Vision       Perception     Praxis      Cognition Arousal/Alertness: Awake/alert Behavior During Therapy: Flat affect Overall Cognitive Status: Impaired/Different from baseline Area of Impairment: Awareness, Problem solving, Following commands, Safety/judgement                       Following Commands: Follows one step commands with increased time Safety/Judgement: Decreased awareness of safety, Decreased awareness of deficits Awareness: Intellectual Problem Solving: Difficulty sequencing, Requires verbal cues, Requires tactile cues General Comments: SMT deficits; follows 1-step verbal commands with good accuracy. HOH.        Exercises Exercises: General Lower Extremity General Exercises - Lower Extremity Straight Leg Raises: Strengthening, Both, 10 reps, Seated Hip Flexion/Marching: Strengthening, Both, 10 reps, Seated Toe Raises: Strengthening, Both, 10 reps, Seated    Shoulder Instructions       General Comments VSS on RA.    Pertinent Vitals/ Pain       Pain Assessment Pain Assessment: Faces Faces Pain Scale: Hurts even more Pain Location: bilat feet R>Cameron Pain Descriptors / Indicators: Throbbing, Sharp, Aching, Discomfort, Guarding, Grimacing Pain Intervention(s): Limited activity within patient's tolerance, Monitored during session, Repositioned  Home Living                                          Prior Functioning/Environment              Frequency  Min 2X/week        Progress Toward Goals  OT Goals(current goals can now be found in the care plan section)  Progress towards OT goals: Progressing toward goals  Acute Rehab OT Goals Patient Stated Goal: To get better. OT Goal Formulation: With patient Time For Goal Achievement: 04/18/21 Potential to Achieve Goals: Good ADL Goals Pt Will Perform Grooming: with  supervision;standing Pt Will Perform Upper Body Dressing: with modified independence;sitting Pt Will Perform Lower Body Dressing: with supervision;sit to/from stand Pt Will Transfer to Toilet: with modified independence;ambulating Pt Will Perform Toileting - Clothing Manipulation and hygiene: with modified independence;sit to/from stand  Plan Discharge plan remains appropriate;Frequency remains appropriate    Co-evaluation                 AM-PAC OT "6 Clicks" Daily Activity     Outcome Measure   Help from another person eating meals?: None Help from another person taking care of personal grooming?: A Little Help from another person toileting, which includes using toliet, bedpan, or urinal?: A Little Help from another person bathing (including washing, rinsing, drying)?: A Little Help from another person to put on and taking off regular upper body clothing?: A Little Help from another person to put on and taking off regular lower body clothing?: A Lot 6 Click Score: 18    End of Session Equipment Utilized During Treatment: Gait belt;Rolling walker (2 wheels)  OT Visit Diagnosis:  Unsteadiness on feet (R26.81);Muscle weakness (generalized) (M62.81);History of falling (Z91.81);Pain Pain - Right/Left: Right Pain - part of body: Leg   Activity Tolerance Patient tolerated treatment well;Patient limited by pain   Patient Left in chair;with call bell/phone within reach;with chair alarm set   Nurse Communication Mobility status        Time: 0932-3557 OT Time Calculation (min): 16 min  Charges: OT General Charges $OT Visit: 1 Visit OT Treatments $Therapeutic Exercise: 8-22 mins  Evona Westra H. OTR/Cameron Supplemental OT, Department of rehab services [phone]  Xia Stohr R H. 04/06/2021, 9:59 AM

## 2021-04-06 NOTE — Assessment & Plan Note (Addendum)
Per vascular: rescheduled his surgery for Monday, 1/30 (as LHC planned for this date suspect this will need to be moved).   The patient does not have usable vein, and therefore PTFE will be used as the conduit. Being that he does not have osteomyelitis, will discuss plans with podiatry prior to or He is aware this is a high risk surgery, and that plastic conduit has a higher failure rate.  Unfortunately, his only options are amputation or bypass.

## 2021-04-06 NOTE — TOC Initial Note (Signed)
Transition of Care [facility]) - Initial/Assessment Note    Patient Details  Name: [name] MRN: [id number] Date of Birth: 1933/03/21  [facility] CM/SW Contact:    Beckie Busing, RN Phone Number:[phone]  04/06/2021, 2:52 PM  Clinical Narrative:                 [facility] consulted for [facility] and DME needs. CM at bedside to assess patient. Patient states that he is from home where he lives in a house alone and functions independently. Patient states that he does not have any home health services. Patient reports that he has a walker but does not have room for any other DME. CM advised patient of current recommendations for rolling walker and tub/shower seat. Patient states that his shower is not big enough to get a seat in it. Patient states that he actually is unsure if he is going to need any DME and wants to wait to discuss options when he is closer to discharge. CM offered patient choice for [facility] services by providing medicare.gov list. Patient states that he does not have a preference but would like for CM to wait before setting up HH. TOC will continue to follow.   Expected Discharge Plan: Home w Home Health Services Barriers to Discharge: Continued Medical Work up   Patient Goals and CMS Choice Patient states their goals for this hospitalization and ongoing recovery are:: Patient says "its too early in his hospital stay to have a goal". CMS Medicare.gov Compare Post Acute Care list provided to:: Patient Choice offered to / list presented to : Patient  Expected Discharge Plan and Services Expected Discharge Plan: Home w Home Health Services In-house Referral: NA Discharge Planning Services: CM Consult Post Acute Care Choice: Home Health Living arrangements for the past 2 months: Single Family Home                 DME Arranged:  (patietn wants to wait for DME arrangement)                    Prior Living Arrangements/Services Living arrangements for the past 2 months:  Single Family Home Lives with:: Self Patient language and need for interpreter reviewed:: Yes        Need for Family Participation in Patient Care: Yes (Comment) Care giver support system in place?: Yes (comment) Current home services:  (none) Criminal Activity/Legal Involvement Pertinent to Current Situation/Hospitalization: No - Comment as needed  Activities of Daily Living Home Assistive Devices/Equipment: Cane (specify quad or straight) ADL Screening (condition at time of admission) Patient's cognitive ability adequate to safely complete daily activities?: Yes Is the patient deaf or have difficulty hearing?: No Does the patient have difficulty seeing, even when wearing glasses/contacts?: Yes Does the patient have difficulty concentrating, remembering, or making decisions?: No Patient able to express need for assistance with ADLs?: No Does the patient have difficulty dressing or bathing?: No Independently performs ADLs?: Yes (appropriate for developmental age) Does the patient have difficulty walking or climbing stairs?: Yes Weakness of Legs: Both Weakness of Arms/Hands: None  Permission Sought/Granted   Permission granted to share information with : No              Emotional Assessment Appearance:: Appears stated age Attitude/Demeanor/Rapport: Apprehensive Affect (typically observed): Pleasant Orientation: : Oriented to Self, Oriented to Situation, Oriented to Place Alcohol / Substance Use: Not Applicable Psych Involvement: No (comment)  Admission diagnosis:  Cellulitis and abscess of foot [  L03.119, L02.619] Foot pain, right [M79.671] Cellulitis of right lower extremity [L03.115] Patient Active Problem List   Diagnosis Date Noted   Critical limb ischemia of both lower extremities (HCC)    Paroxysmal atrial flutter (HCC) 04/01/2021   Urinary hesitancy 04/01/2021   Cellulitis and abscess of foot 03/31/2021   Chronic systolic CHF (congestive heart failure) (HCC)  03/31/2021   Macular degeneration 03/31/2021   PAD (peripheral artery disease) (HCC) 03/31/2021   Cellulitis of left upper arm 01/18/2021   Ulcer of right foot, limited to breakdown of skin (HCC) 05/20/2020   Benign paroxysmal vertigo of both ears 07/28/2019   Impacted cerumen, bilateral 04/24/2018   PAF (paroxysmal atrial fibrillation) (HCC) 12/31/2017   Thrombocytopenia due to drugs 07/16/2017   Partial small bowel obstruction (HCC) 03/05/2017   Recurrent incisional hernia 03/05/2017   History of small bowel obstruction 03/05/2017   Dizziness 10/05/2016   Abnormal myocardial perfusion study 09/15/2016   History of cardiac radiofrequency ablation 09/15/2016   Hx of CABG 09/15/2016   Old MI (myocardial infarction) 09/15/2016   Aortic atherosclerosis (HCC) 02/16/2016   Anticoagulant long-term use 05/05/2015   BMI 31.0-31.9,adult 05/05/2015   Chronic rhinitis 05/05/2015   CAD (coronary artery disease) with history of CABG 05/05/2015   Elevated PSA 05/05/2015   GERD (gastroesophageal reflux disease) 05/05/2015   High risk medication use 05/05/2015   Lumbar degenerative disc disease 05/05/2015   Malaise and fatigue 05/05/2015   Prediabetes 05/05/2015   Primary osteoarthritis involving multiple joints 05/05/2015   Typical atrial flutter (HCC) 05/05/2015   Ventral hernia without obstruction or gangrene 05/05/2015   Sick sinus syndrome (HCC) status post pacemaker 02/17/2015   Hyperlipidemia 02/14/2015   Preoperative cardiovascular examination 02/14/2015   Presence of cardiac pacemaker 02/14/2015   RBBB with left anterior fascicular block 02/14/2015   PCP:  Gordan Payment., MD Pharmacy:   [facility] [phone] Rosalita Levan, [place] - [address] [place] [address] Phone: [phone] Fax: [phone]     Social Determinants of Health (SDOH) Interventions    Readmission Risk Interventions No flowsheet data found.

## 2021-04-06 NOTE — Progress Notes (Signed)
Subjective:  Patient ID: [name], male    DOB: [birth year],  MRN: [id number]  Patient seen at  bedside. Relates  he is doing ok and states he is tired of getting bad news. Relates his foot has been painful. Denies n/v/f/c.   Past Medical History:  Diagnosis Date   Aortic atherosclerosis ([place]) 02/16/2016   BMI 31.0-31.9,adult 05/05/2015   CAD in native artery 02/14/2015   Chronic rhinitis 05/05/2015   Elevated PSA 05/05/2015   [facility]   Essential hypertension 02/14/2015   GERD (gastroesophageal reflux disease) 05/05/2015   History of cardiac radiofrequency ablation 09/15/2016   Hx of CABG 09/15/2016   Hyperlipidemia 02/14/2015   Ischemic cardiomyopathy    Lumbar degenerative disc disease 05/05/2015   Partial small bowel obstruction ([place]) 03/05/2017   Prediabetes 05/05/2015   Presence of cardiac pacemaker    [place]   Primary osteoarthritis involving multiple joints 05/05/2015   Last Assessment & Plan:  Relevant Hx: Course: Daily Update: Today's Plan:he has known OA to his joints and he has seen chiropractor in the past for this and he states that he was told he had degenerative changes to his back several times. The knee needs to be xrayed to the right with the swelling , he is taking the oxycodone only as needed but it works well he does not feel dizzy whereas the aleve   RBBB with left anterior fascicular block 02/14/2015   Recurrent incisional hernia 03/05/2017   SSS (sick sinus syndrome) ([place]) 02/17/2015   Thrombocytopenia due to drugs 07/16/2017   Typical atrial flutter ([place]) 05/05/2015   Last Assessment & Plan:  Warfarin was stopped September 07, 2016 by physicians assistant or [place] cardiology after consistent monitoring showed no recurrence of A. fib for more than 1-1/2 years   Ventral hernia without obstruction or gangrene 05/05/2015     Past Surgical History:  Procedure Laterality Date   ABDOMINAL AORTOGRAM W/LOWER EXTREMITY Bilateral 04/04/2021   Procedure: ABDOMINAL  AORTOGRAM W/LOWER EXTREMITY;  Surgeon: Waynetta Sandy, MD;  Location: [facility];  Service: Cardiovascular;  Laterality: Bilateral;   ATRIAL FLUTTER ABLATION     COLON SURGERY     CORONARY ARTERY BYPASS GRAFT     INSERT / REPLACE / REMOVE PACEMAKER      CBC Latest Ref Rng & Units 04/06/2021 04/05/2021 04/03/2021  WBC 4.0 - 10.5 K/uL 8.0 7.7 7.6  Hemoglobin 13.0 - 17.0 g/dL 12.9(L) 14.0 14.6  Hematocrit 39.0 - 52.0 % 38.9(L) 41.5 44.1  Platelets 150 - 400 K/uL 174 168 167    BMP Latest Ref Rng & Units 04/06/2021 04/05/2021 04/03/2021  Glucose 70 - 99 mg/dL 103(H) 96 110(H)  BUN 8 - 23 mg/dL 10 13 16   Creatinine 0.61 - 1.24 mg/dL 0.86 0.88 1.01  BUN/Creat Ratio 10 - 24 - - -  Sodium 135 - 145 mmol/L 136 136 135  Potassium 3.5 - 5.1 mmol/L 3.7 3.6 3.8  Chloride 98 - 111 mmol/L 108 106 106  CO2 22 - 32 mmol/L 21(L) 21(L) 18(L)  Calcium 8.9 - 10.3 mg/dL 8.4(L) 8.1(L) 8.3(L)     Objective:   Vitals:   04/06/21 0804 04/06/21 1104  BP: (!) 141/68 (!) 114/54  Pulse: 61 65  Resp: 19 19  Temp: 98.7 F (37.1 C) 98 F (36.7 C)  SpO2: 97% 94%    General:AA&O x 3. Normal mood and affect   Vascular: DP and PT pulses 2/4 bilateral. Brisk capillary refill to all digits. Pedal hair  present   Neruological. Epicritic sensation grossly intact.   Derm: Necrotic wound noted to medial right hallux with surrounding erythema.  . No purulence. No probe to bone. Interspaces clears of maceration. Nails well groomed and normal in appearance  MSK: MMT 5/5 in dorsiflexion, plantar flexion, inversion and eversion. Normal joint ROM without pain or crepitus.       Assessment & Plan:  Patient was evaluated and treated and all questions answered.  DX: Right hallux ulcer secondary to PAD with infection  Wound care: betadine, DSD  Antibiotics: Per primary  DME: post-op shoe  Discussed with patient diagnosis and treatment options.  Imaging reviewed. CT and bone scan showing no  osteomyelitis.  Patient planning to get bypass with vascular surgery to aid with wound healing.  Discussed wound care with patient.  No surgical intervention at this time from podiatry standpoint.  Patient in agreement with plan and all questions answered.  Will follow-up with podiatry upon discharge.  We will be available if any issues but will sign off for now.   Lorenda Peck, MD  Accessible via secure chat for questions or concerns.

## 2021-04-07 ENCOUNTER — Inpatient Hospital Stay (HOSPITAL_COMMUNITY): Payer: Medicare Other

## 2021-04-07 DIAGNOSIS — R3911 Hesitancy of micturition: Secondary | ICD-10-CM

## 2021-04-07 DIAGNOSIS — R079 Chest pain, unspecified: Secondary | ICD-10-CM

## 2021-04-07 LAB — NM MYOCAR MULTI W/SPECT W/WALL MOTION / EF
Exercise duration (min): 5 min
MPHR: 135 {beats}/min
Peak HR: 96 {beats}/min
Percent HR: 71 %
Rest HR: 69 {beats}/min
ST Depression (mm): 0 mm

## 2021-04-07 MED ORDER — REGADENOSON 0.4 MG/5ML IV SOLN
INTRAVENOUS | Status: AC
  Filled 2021-04-07: qty 5

## 2021-04-07 MED ORDER — TECHNETIUM TC 99M TETROFOSMIN IV KIT
10.3000 | PACK | Freq: Once | INTRAVENOUS | Status: AC | PRN
  Administered 2021-04-07: 10.3 via INTRAVENOUS

## 2021-04-07 MED ORDER — TECHNETIUM TC 99M TETROFOSMIN IV KIT
30.1000 | PACK | Freq: Once | INTRAVENOUS | Status: AC | PRN
  Administered 2021-04-07: 30.1 via INTRAVENOUS

## 2021-04-07 MED ORDER — MELATONIN 5 MG PO TABS
5.0000 mg | ORAL_TABLET | Freq: Every day | ORAL | Status: DC
  Administered 2021-04-07 – 2021-04-15 (×9): 5 mg via ORAL
  Filled 2021-04-07 (×9): qty 1

## 2021-04-07 MED ORDER — SODIUM CHLORIDE 0.9 % IV SOLN
2.0000 g | INTRAVENOUS | Status: DC
  Administered 2021-04-07 – 2021-04-08 (×2): 2 g via INTRAVENOUS
  Filled 2021-04-07 (×2): qty 20

## 2021-04-07 MED ORDER — REGADENOSON 0.4 MG/5ML IV SOLN
0.4000 mg | Freq: Once | INTRAVENOUS | Status: AC
  Administered 2021-04-07: 0.4 mg via INTRAVENOUS

## 2021-04-07 NOTE — Progress Notes (Signed)
PT Cancellation Note  Patient Details Name: [name] MRN: [id number] DOB: [birth year]   Cancelled Treatment:    Reason Eval/Treat Not Completed: Patient at procedure or test/unavailable - pt out of room, PT to check back tomorrow.   Stacie Glaze, PT DPT Acute Rehabilitation Services Pager [phone]  Office [phone]    Louis Matte 04/07/2021, 3:05 PM

## 2021-04-07 NOTE — Progress Notes (Signed)
Progress Note   Patient: [name] ZJI:[id number] DOB: [birth year] DOA: 03/31/2021     6 DOS: the patient was seen and examined on 04/07/2021   Brief hospital course:  88 y.o. male with medical history significant of coronary artery disease, ischemic cardiomyopathy with chronic systolic CHF with prior EF of 17%, history of CABG in 2006, a flutter status post ablation in 2014, VT, also sick sinus syndrome status post pacemaker.  He has been dealing with a right lower extremity callus for several weeks, seen as an outpatient by podiatry.  Over the last 3 to 4 days, he has noticed increased pain to the right lower extremity, increased swelling and redness.  He has a degree of neuropathy and does not feel much.  He was seen in podiatrist office and was sent to the hospital.  Vascular surgery as well as podiatry consulted, arteriogram shows significant right lower extremity arterial disease now being considered for bypass surgery   Assessment and Plan * Cellulitis and abscess of foot- (present on admission) - Patient admitted with right foot cellulitis and abscess.  - Could not get an MRI due to pacemaker.  -initially: pus draining from right greater toe/Cellulitis extends up to mid upper shins.  -antibiotics-- will plan to narrow on 1/26  -Blood cultures: no growth to date.   -Vascular surgery consulted, underwent arteriogram 1/23, no possible intervention and he is to be considered for right femoral to anterior tibial artery bypass.  Pending cardiac evaluation prior to surgery.  Stress test unable to be done until 1/26 -Podiatry consulted as well:.  Bone scan does not show osteomyelitis  Critical limb ischemia of both lower extremities [facility]) Per vascular: rescheduled his surgery for Monday, 1/30.  The patient does not have usable vein, and therefore PTFE will be used as the conduit. Being that he does not have osteomyelitis, will discuss plans with podiatry prior to or He is aware this is a high  risk surgery, and that plastic conduit has a higher failure rate.  Unfortunately, his only options are amputation or bypass.  Chronic systolic CHF (congestive heart failure) (HCC) -Last seen cardiology at [facility], Dr. Sampson Goon, in 2018.  It was mentioned that the EF at that time was 40 to 45%, and recently had a stress test which was negative.  2D echo done here shows EF 40-45% with mildly decreased LV function.  Suspect unchanged.  He has no heart failure symptoms, no angina.  Currently he is not on goal-directed therapy, not clear when he stopped the medications but back in 2018 he was on metoprolol 12.5 twice daily as well as aspirin.  Discussed with the daughter, he has self discontinued the medication as he just does not like to take pills.  Currently appears stable.  Vascular surgery request cardiology evaluation for restratification pre-bypass surgery.  -see above re: stress test 1/26 PM  Essential hypertension-resolved as of 04/06/2021 -Monitor blood pressure, currently not taking any medications, has remained within acceptable parameters   Urinary hesitancy - Possibly due to BPH.  This is been going on for quite some time, but most recently has noticed that he needs to urinate more often during the nighttime.  He is supposed to see urology outpatient - started on Flomax while here, no further issues, urinating well  Paroxysmal atrial flutter (HCC) - history of ablation, now has pacemaker.  Apparently he was on Coumadin in the past, and per care everywhere notes, in 2018, his Coumadin was stopped as he had no  recurrence for A. fib or flutter for 1.5 years at that time.  CAD (coronary artery disease) with history of CABG -noted, no chest pain or apparent active issues -stress test planned for 1/26 PM  Macular degeneration - Noted  Dizziness -Chronic   Hyperlipidemia- (present on admission) - Has not been taking statin at home.  LDL at 71  Hx of CABG - Noted  PAD (peripheral artery  disease) (HCC) -vascular consulted.  Arteriogram as above  Sick sinus syndrome (HCC) status post pacemaker- (present on admission) - Keep on medical telemetry.  He is paced    Subjective: is having a stress test today, NPO  Objective Vitals:   04/06/21 1936 04/06/21 2311 04/07/21 0336 04/07/21 0736  BP: (!) 124/56 127/65 129/70 139/67  Pulse: 73 64 60 66  Resp: 18 16 17 20   Temp: 98.5 F (36.9 C) 97.6 F (36.4 C) (!) 97.5 F (36.4 C) 98.2 F (36.8 C)  TempSrc: Oral Oral Oral Oral  SpO2: 96% 98% 98% 99%  Weight:   88 kg   Height:         General: Appearance:     Overweight male in no acute distress     Lungs:     respirations unlabored  Heart:    Normal heart rate.   MS:   All extremities are intact.    Neurologic:   Awake, alert     Data Reviewed: Labs ordered for the AM    Disposition: Status is: Inpatient  Remains inpatient appropriate because: needs stress test, surgery      Time spent: 35 minutes  Author: , DO 04/07/2021 10:48 AM  For on call review [email or web address].

## 2021-04-07 NOTE — Progress Notes (Signed)
° °  [name] presented for a lexiscan cardiolite today.  No immediate complications.  Stress imaging is pending at this time.  Laverda Page, NP 04/07/2021, 2:29 PM

## 2021-04-07 NOTE — Progress Notes (Signed)
° ° °  Chart check Patient pending Cardiac stress test work for cardiac risk factors and maximum medical improvement before proceeding with right LE bypass for limb salvage.  Plan for right LE bypass Monday 1/30 by Dr. Karin Lieu with PTFE.  His only other option is primary amputation.    Dr. Karin Lieu planned on speaking with his PDM as well.   He will need NPO ordered on 04/10/21.   Mosetta Pigeon PA-C

## 2021-04-07 NOTE — Progress Notes (Signed)
Pt tolerated stress test very well. Vitals stable, no complaints.

## 2021-04-08 ENCOUNTER — Encounter (HOSPITAL_COMMUNITY): Payer: Medicare Other

## 2021-04-08 ENCOUNTER — Encounter: Payer: Medicare Other | Admitting: Vascular Surgery

## 2021-04-08 DIAGNOSIS — L03119 Cellulitis of unspecified part of limb: Secondary | ICD-10-CM | POA: Diagnosis not present

## 2021-04-08 DIAGNOSIS — I70291 Other atherosclerosis of native arteries of extremities, right leg: Secondary | ICD-10-CM | POA: Diagnosis not present

## 2021-04-08 DIAGNOSIS — S91101A Unspecified open wound of right great toe without damage to nail, initial encounter: Secondary | ICD-10-CM | POA: Diagnosis not present

## 2021-04-08 DIAGNOSIS — I2510 Atherosclerotic heart disease of native coronary artery without angina pectoris: Secondary | ICD-10-CM | POA: Diagnosis not present

## 2021-04-08 DIAGNOSIS — R9439 Abnormal result of other cardiovascular function study: Secondary | ICD-10-CM

## 2021-04-08 DIAGNOSIS — I70223 Atherosclerosis of native arteries of extremities with rest pain, bilateral legs: Secondary | ICD-10-CM | POA: Diagnosis not present

## 2021-04-08 DIAGNOSIS — I4892 Unspecified atrial flutter: Secondary | ICD-10-CM

## 2021-04-08 DIAGNOSIS — I5022 Chronic systolic (congestive) heart failure: Secondary | ICD-10-CM | POA: Diagnosis not present

## 2021-04-08 LAB — CBC
HCT: 39.6 % (ref 39.0–52.0)
Hemoglobin: 13.5 g/dL (ref 13.0–17.0)
MCH: 30.5 pg (ref 26.0–34.0)
MCHC: 34.1 g/dL (ref 30.0–36.0)
MCV: 89.4 fL (ref 80.0–100.0)
Platelets: 184 10*3/uL (ref 150–400)
RBC: 4.43 MIL/uL (ref 4.22–5.81)
RDW: 11.4 % — ABNORMAL LOW (ref 11.5–15.5)
WBC: 7.1 10*3/uL (ref 4.0–10.5)
nRBC: 0 % (ref 0.0–0.2)

## 2021-04-08 LAB — BASIC METABOLIC PANEL
Anion gap: 8 (ref 5–15)
BUN: 10 mg/dL (ref 8–23)
CO2: 22 mmol/L (ref 22–32)
Calcium: 8.4 mg/dL — ABNORMAL LOW (ref 8.9–10.3)
Chloride: 107 mmol/L (ref 98–111)
Creatinine, Ser: 0.81 mg/dL (ref 0.61–1.24)
GFR, Estimated: >60 mL/min (ref >60)
Glucose, Bld: 98 mg/dL (ref 70–99)
Potassium: 3.4 mmol/L — ABNORMAL LOW (ref 3.5–5.1)
Sodium: 137 mmol/L (ref 135–145)

## 2021-04-08 MED ORDER — CAMPHOR-MENTHOL 0.5-0.5 % EX LOTN
TOPICAL_LOTION | CUTANEOUS | Status: DC | PRN
  Administered 2021-04-09: 1 via TOPICAL
  Filled 2021-04-08: qty 222

## 2021-04-08 MED ORDER — POTASSIUM CHLORIDE CRYS ER 20 MEQ PO TBCR
40.0000 meq | EXTENDED_RELEASE_TABLET | Freq: Once | ORAL | Status: AC
  Administered 2021-04-08: 40 meq via ORAL
  Filled 2021-04-08: qty 2

## 2021-04-08 MED ORDER — DIPHENHYDRAMINE HCL 25 MG PO CAPS
25.0000 mg | ORAL_CAPSULE | Freq: Four times a day (QID) | ORAL | Status: DC | PRN
  Administered 2021-04-08 – 2021-04-12 (×10): 25 mg via ORAL
  Filled 2021-04-08 (×10): qty 1

## 2021-04-08 MED ORDER — SODIUM CHLORIDE 0.9% FLUSH
3.0000 mL | Freq: Two times a day (BID) | INTRAVENOUS | Status: DC
  Administered 2021-04-08 – 2021-04-10 (×3): 3 mL via INTRAVENOUS

## 2021-04-08 NOTE — Progress Notes (Signed)
Physical Therapy Treatment Patient Details Name: [name] MRN: [id number] DOB: 1934-03-09 Today's Date: 04/08/2021   History of Present Illness 88 y.o. male presents to ED with RLE wound being followed by outpatient podiatry. Increased pain over last 3-4 days, and outpatient podiatrist sent to ED 03/31/21. Foot x-ray shows concern for soft tissue gas/infection with a gas-forming organism. 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 s/p R common femoral artery and RLE angiogram, LLE angiography 1/23; pt to be considered for R femoral to anterior tibial artery bypass. PMH: coronary artery disease, ischemic cardiomyopathy with chronic systolic CHF with prior EF of 25%, history of CABG in 2006, a flutter status post ablation in 2014, VT, also sick sinus syndrome status post pacemaker.    PT Comments    Pt tolerates gait much better with shoes donned, last session pt did not notify PT he had a darco shoe and tennis shoe in his room. Pt ambulatory for a little over room distance with use of RW and close guard, pt mostly limited by increasing R foot pain. PT encouraged pt to ambulate daily over the weekend with staff, pt agreeable. PT to continue to follow.     Recommendations for follow up therapy are one component of a multi-disciplinary discharge planning process, led by the attending physician.  Recommendations may be updated based on patient status, additional functional criteria and insurance authorization.  Follow Up Recommendations  Home health PT     Assistance Recommended at Discharge Frequent or constant Supervision/Assistance  Patient can return home with the following A little help with walking and/or transfers;A little help with bathing/dressing/bathroom;Assistance with cooking/housework;Assist for transportation;Help with stairs or ramp for entrance   Equipment Recommendations  Rolling walker (2  wheels);BSC/3in1    Recommendations for Other Services       Precautions / Restrictions Precautions Precautions: Fall Precaution Comments: R toe chronic wound - check for weeping, encouraged more heel WB during mobility, post-op shoe in pt's closet Restrictions Weight Bearing Restrictions: No     Mobility  Bed Mobility Overal bed mobility: Needs Assistance             General bed mobility comments: up in recliner upon arrival to room, NT assisted    Transfers Overall transfer level: Needs assistance Equipment used: Rolling walker (2 wheels) Transfers: Sit to/from Stand Sit to Stand: Min assist           General transfer comment: assist for power up, rise, steadying.    Ambulation/Gait Ambulation/Gait assistance: Min guard Gait Distance (Feet): 30 Feet Assistive device: Rolling walker (2 wheels) Gait Pattern/deviations: Step-through pattern, Decreased stride length, Trunk flexed Gait velocity: decr     General Gait Details: cues for upright posture, placement in RW, heel WB in post-op shoe if possible for pt comfort. Pt with increasingly antalgic gait with further distance   Stairs             Wheelchair Mobility    Modified Rankin (Stroke Patients Only)       Balance Overall balance assessment: Needs assistance Sitting-balance support: Feet supported, No upper extremity supported Sitting balance-Leahy Scale: Fair Sitting balance - Comments: Guarding needed with dynamic balance at EOB to prevent LOB.   Standing balance support: Single extremity supported, Bilateral upper extremity supported, Reliant on assistive device for balance Standing balance-Leahy Scale: Poor  Cognition Arousal/Alertness: Awake/alert Behavior During Therapy: WFL for tasks assessed/performed Overall Cognitive Status: Within Functional Limits for tasks assessed                                           Exercises      General Comments        Pertinent Vitals/Pain Pain Assessment Pain Assessment: Faces Faces Pain Scale: Hurts even more Pain Location: R foot Pain Descriptors / Indicators: Throbbing, Sharp, Aching, Discomfort Pain Intervention(s): Limited activity within patient's tolerance, Monitored during session, Repositioned    Home Living                          Prior Function            PT Goals (current goals can now be found in the care plan section) Acute Rehab PT Goals Patient Stated Goal: go home PT Goal Formulation: With patient Time For Goal Achievement: 04/17/21 Potential to Achieve Goals: Good Progress towards PT goals: Progressing toward goals    Frequency    Min 3X/week      PT Plan Current plan remains appropriate    Co-evaluation              AM-PAC PT "6 Clicks" Mobility   Outcome Measure  Help needed turning from your back to your side while in a flat bed without using bedrails?: A Little Help needed moving from lying on your back to sitting on the side of a flat bed without using bedrails?: A Little Help needed moving to and from a bed to a chair (including a wheelchair)?: A Little Help needed standing up from a chair using your arms (e.g., wheelchair or bedside chair)?: A Little Help needed to walk in hospital room?: A Little Help needed climbing 3-5 steps with a railing? : A Lot 6 Click Score: 17    End of Session   Activity Tolerance: Patient limited by pain Patient left: with call bell/phone within reach;in chair;with chair alarm set Nurse Communication: Mobility status PT Visit Diagnosis: Unsteadiness on feet (R26.81);Other abnormalities of gait and mobility (R26.89);Muscle weakness (generalized) (M62.81);Difficulty in walking, not elsewhere classified (R26.2);Pain     Time: [id number] PT Time Calculation (min) (ACUTE ONLY): 19 min  Charges:  $Gait Training: 8-22 mins                    Stacie Glaze, PT  DPT Acute Rehabilitation Services Pager [phone]  Office [phone]    Louis Matte 04/08/2021, 12:24 PM

## 2021-04-08 NOTE — Progress Notes (Addendum)
Pt noted to have red itchy rash on bilat arms, chest, and back. Pt noted to be scratching a lot. Pt states he has been itchy for a few days. MD paged. Awaiting response.   1033: See new orders

## 2021-04-08 NOTE — Progress Notes (Addendum)
Shared Decision Making/Informed Consent The risks [stroke (1 in 1000), death (1 in 1000), kidney failure [usually temporary] (1 in 500), bleeding (1 in 200), allergic reaction [possibly serious] (1 in 200)], benefits (diagnostic support and management of coronary artery disease) and alternatives of a cardiac catheterization were discussed in detail with [name] and he is willing to proceed.  Scheduled for Monday @ 1:30pm with Dr. Okey Dupre. Orders written per d/w Dr. Duke Salvia.

## 2021-04-08 NOTE — Progress Notes (Signed)
Progress Note   Patient: [name] NLG:[id number] DOB: Dec 04, 1933 DOA: 03/31/2021     7 DOS: the patient was seen and examined on 04/08/2021   Brief hospital course:  88 y.o. male with medical history significant of coronary artery disease, ischemic cardiomyopathy with chronic systolic CHF with prior EF of 08%, history of CABG in 2006, a flutter status post ablation in 2014, VT, also sick sinus syndrome status post pacemaker.  He has been dealing with a right lower extremity callus for several weeks, seen as an outpatient by podiatry.  Over the last 3 to 4 days, he has noticed increased pain to the right lower extremity, increased swelling and redness.  He has a degree of neuropathy and does not feel much.  He was seen in podiatrist office and was sent to the hospital.  Vascular surgery as well as podiatry consulted, arteriogram shows significant right lower extremity arterial disease now being considered for bypass surgery but first needs cardiology evaluation.  Assessment and Plan * Cellulitis and abscess of foot- (present on admission) - Patient admitted with right foot cellulitis and abscess.  - Could not get an MRI due to pacemaker.  -initially: pus draining from right greater toe/Cellulitis extends up to mid upper shins.  -antibiotics-- will plan to narrow on 1/26 and place stop date of 10 days -Blood cultures: no growth to date.   -Vascular surgery consulted, underwent arteriogram 1/23, no possible intervention and he is to be considered for right femoral to anterior tibial artery bypass.  Pending cardiac evaluation prior to surgery- now plans for [facility] on Monday -Podiatry consulted as well:.  Bone scan does not show osteomyelitis  Critical limb ischemia of both lower extremities [facility]) Per vascular: rescheduled his surgery for Monday, 1/30 (as LHC planned for this date suspect this will need to be moved).   The patient does not have usable vein, and therefore PTFE will be used as the  conduit. Being that he does not have osteomyelitis, will discuss plans with podiatry prior to or He is aware this is a high risk surgery, and that plastic conduit has a higher failure rate.  Unfortunately, his only options are amputation or bypass.  Chronic systolic CHF (congestive heart failure) (HCC) -Last seen cardiology at [facility], Dr. Sampson Goon, in 2018.  It was mentioned that the EF at that time was 40 to 45%, and recently had a stress test which was negative.  2D echo done here shows EF 40-45% with mildly decreased LV function.  Suspect unchanged.  He has no heart failure symptoms, no angina.  Currently he is not on goal-directed therapy, not clear when he stopped the medications but back in 2018 he was on metoprolol 12.5 twice daily as well as aspirin.  Discussed with the daughter, he has self discontinued the medication as he just does not like to take pills.  Currently appears stable.  Vascular surgery request cardiology evaluation for restratification pre-bypass surgery.  -LHC for monday  Essential hypertension-resolved as of 04/06/2021 -Monitor blood pressure, currently not taking any medications, has remained within acceptable parameters   Urinary hesitancy - Possibly due to BPH.  This is been going on for quite some time, but most recently has noticed that he needs to urinate more often during the nighttime.  He is supposed to see urology outpatient - started on Flomax while here, no further issues, urinating well  Paroxysmal atrial flutter (HCC) - history of ablation, now has pacemaker.  Apparently he was on Coumadin in  the past, and per care everywhere notes, in 2018, his Coumadin was stopped as he had no recurrence for A. fib or flutter for 1.5 years at that time.  CAD (coronary artery disease) with history of CABG -noted, no chest pain or apparent active issues -stress test planned for 1/26 PM  Macular degeneration - Noted  Dizziness -Chronic   Hyperlipidemia- (present on  admission) - Has not been taking statin at home.  LDL at 71  Hx of CABG - Noted  PAD (peripheral artery disease) (HCC) -vascular consulted.  Arteriogram as above  Sick sinus syndrome (HCC) status post pacemaker- (present on admission) - Keep on medical telemetry.  He is paced     Subjective: itching   Objective Vitals:   04/08/21 0500 04/08/21 0720 04/08/21 1120 04/08/21 1233  BP:  (!) 119/55 (!) 145/73   Pulse:  61 61 68  Resp:  18 16 16   Temp:  98.7 F (37.1 C) 98.5 F (36.9 C)   TempSrc:  Oral Oral   SpO2:  98% 98% 97%  Weight: 87.9 kg     Height:          Data Reviewed: K is low  Family Communication: called daughter  Disposition: Status is: Inpatient  Remains inpatient appropriate because: needs heart cath and then vascular procedure with bypass      Time spent: 35 minutes  Author: Jesse Sans, DO 04/08/2021 2:44 PM  For on call review [email or web address].

## 2021-04-08 NOTE — Progress Notes (Addendum)
°  Progress Note    04/08/2021 5:06 PM 4 Days Post-Op  Subjective:  occasional pain in R GT; no fevers, chills, N/V   Vitals:   04/08/21 1233 04/08/21 1517  BP:  (!) 151/79  Pulse: 68 64  Resp: 16 19  Temp:  (!) 97.5 F (36.4 C)  SpO2: 97% 97%   Physical Exam: Lungs:  non labored Incisions:  L groin cath site without hematoma Extremities:  R foot erythema and necrotic wound R GT Neurologic: A&O  CBC    Component Value Date/Time   WBC 7.1 04/08/2021 0419   RBC 4.43 04/08/2021 0419   HGB 13.5 04/08/2021 0419   HCT 39.6 04/08/2021 0419   PLT 184 04/08/2021 0419   MCV 89.4 04/08/2021 0419   MCH 30.5 04/08/2021 0419   MCHC 34.1 04/08/2021 0419   RDW 11.4 (L) 04/08/2021 0419   LYMPHSABS 0.9 03/31/2021 1351   MONOABS 0.4 03/31/2021 1351   EOSABS 0.0 03/31/2021 1351   BASOSABS 0.0 03/31/2021 1351    BMET    Component Value Date/Time   NA 137 04/08/2021 0419   NA 140 07/08/2019 0932   K 3.4 (L) 04/08/2021 0419   CL 107 04/08/2021 0419   CO2 22 04/08/2021 0419   GLUCOSE 98 04/08/2021 0419   BUN 10 04/08/2021 0419   BUN 21 07/08/2019 0932   CREATININE 0.81 04/08/2021 0419   CALCIUM 8.4 (L) 04/08/2021 0419   GFRNONAA >60 04/08/2021 0419   GFRAA 89 07/08/2019 0932    INR No results found for: INR   Intake/Output Summary (Last 24 hours) at 04/08/2021 1706 Last data filed at 04/08/2021 1245 Gross per 24 hour  Intake 840.28 ml  Output 800 ml  Net 40.28 ml      Assessment/Plan:  88 y.o. male is s/p RLE arteriogram 4 Days Post-Op    I have independently interviewed and examined the patient. I agree with the above.   Doing well this morning, no complaints Intermittent pain of the right great toe No osteomyelitis of the right great toe on MRI Needs cardiac catheterization.  I will reschedule his case from Monday 1/30 and come up with another time. The patient does not have usable vein, and therefore PTFE will be used as the conduit. Being that he does not  have osteomyelitis, will discuss plans with podiatry prior to or He is aware this is a high risk surgery, and that plastic conduit has a higher failure rate.  Unfortunately, his only options are amputation or bypass. I called his daughter today regarding the above. Will call again and let her know bypass is on hold for cardiac cath  Cassandria Santee, MD [facility] Phone Number: [phone] 04/08/2021 5:06 PM

## 2021-04-08 NOTE — Hospital Course (Addendum)
88 y.o. male with medical history significant of coronary artery disease, ischemic cardiomyopathy with chronic systolic CHF with prior EF of 25%, history of CABG in 2006, a flutter status post ablation in 2014, VT, also sick sinus syndrome status post pacemaker.  He has been dealing with a right lower extremity callus for several weeks, seen as an outpatient by podiatry.  Over the last 3 to 4 days, he has noticed increased pain to the right lower extremity, increased swelling and redness.  He has a degree of neuropathy and does not feel much.  He was seen in podiatrist office and was sent to the hospital for cellulitis.  Vascular surgery as well as podiatry consulted, arteriogram shows significant right lower extremity arterial disease now being considered for bypass surgery but first needs cardiology evaluation.  Stress test high risk so now cath is being planned for Monday.

## 2021-04-08 NOTE — Progress Notes (Signed)
Progress Note  Patient Name: [name] Date of Encounter: 04/08/2021  [facility] Cardiologist: Shirlee More, MD   Subjective   Feeling OK.  Afraid of dying and leaving his 2 daughters.  Denies CP/SOB.  Pruritic rash x 4-5 days that has spread from his back to his L arm.   Inpatient Medications    Scheduled Meds:  aspirin EC  81 mg Oral Daily   enoxaparin (LOVENOX) injection  40 mg Subcutaneous Q24H   melatonin  5 mg Oral QHS   pantoprazole  40 mg Oral Daily   sodium chloride flush  3 mL Intravenous Q12H   tamsulosin  0.4 mg Oral QPC supper   Continuous Infusions:  sodium chloride     cefTRIAXone (ROCEPHIN)  IV 2 g (04/07/21 1532)   PRN Meds: sodium chloride, acetaminophen, alum & mag hydroxide-simeth, bisacodyl, camphor-menthol, diphenhydrAMINE, hydrALAZINE, morphine injection, ondansetron (ZOFRAN) IV, oxyCODONE, sodium chloride flush   Vital Signs    Vitals:   04/08/21 0500 04/08/21 0720 04/08/21 1120 04/08/21 1233  BP:  (!) 119/55 (!) 145/73   Pulse:  61 61 68  Resp:  18 16 16   Temp:  98.7 F (37.1 C) 98.5 F (36.9 C)   TempSrc:  Oral Oral   SpO2:  98% 98% 97%  Weight: 87.9 kg     Height:        Intake/Output Summary (Last 24 hours) at 04/08/2021 1257 Last data filed at 04/08/2021 0840 Gross per 24 hour  Intake 600.28 ml  Output 800 ml  Net -199.72 ml   Last 3 Weights 04/08/2021 04/07/2021 04/06/2021  Weight (lbs) 193 lb 12.6 oz 194 lb 0.1 oz 193 lb 2 oz  Weight (kg) 87.9 kg 88 kg 87.6 kg      Telemetry    APVS. - Personally Reviewed  ECG    N/a - Personally Reviewed  Physical Exam   VS:  BP (!) 145/73 (BP Location: Left Arm)    Pulse 68    Temp 98.5 F (36.9 C) (Oral)    Resp 16    Ht 5\' 10"  (1.778 m)    Wt 87.9 kg    SpO2 97%    BMI 27.81 kg/m  , BMI Body mass index is 27.81 kg/m. GENERAL:  Well appearing HEENT: Pupils equal round and reactive, fundi not visualized, oral mucosa unremarkable NECK:  No jugular venous distention, waveform  within normal limits, carotid upstroke brisk and symmetric, no bruits, no thyromegaly LUNGS:  Clear to auscultation bilaterally HEART:  RRR.  PMI not displaced or sustained,S1 and S2 within normal limits, no S3, no S4, no clicks, no rubs, no murmurs ABD:  Flat, positive bowel sounds normal in frequency in pitch, no bruits, no rebound, no guarding, no midline pulsatile mass, no hepatomegaly, no splenomegaly EXT:  no edema.  R foot erythema. Eschar on R 1st toe. SKIN:  No rashes no nodules NEURO:  Cranial nerves II through XII grossly intact, motor grossly intact throughout PSYCH:  Cognitively intact, oriented to person place and time   Labs    High Sensitivity Troponin:  No results for input(s): TROPONINIHS in the last 720 hours.   Chemistry Recent Labs  Lab 04/02/21 0224 04/03/21 0426 04/05/21 0401 04/06/21 0422 04/08/21 0419  NA 136   < > 136 136 137  K 3.8   < > 3.6 3.7 3.4*  CL 107   < > 106 108 107  CO2 23   < > 21* 21* 22  GLUCOSE 121*   < > 96 103* 98  BUN 14   < > 13 10 10   CREATININE 0.88   < > 0.88 0.86 0.81  CALCIUM 8.6*   < > 8.1* 8.4* 8.4*  MG 2.1  --   --   --   --   PROT 5.9*  --   --   --   --   ALBUMIN 3.0*  --   --   --   --   AST 27  --   --   --   --   ALT 17  --   --   --   --   ALKPHOS 59  --   --   --   --   BILITOT 0.6  --   --   --   --   GFRNONAA >60   < > >60 >60 >60  ANIONGAP 6   < > 9 7 8    < > = values in this interval not displayed.    Lipids  Recent Labs  Lab 04/05/21 0401  CHOL 111  TRIG 44  HDL 31*  LDLCALC 71  CHOLHDL 3.6    Hematology Recent Labs  Lab 04/05/21 0401 04/06/21 0422 04/08/21 0419  WBC 7.7 8.0 7.1  RBC 4.64 4.33 4.43  HGB 14.0 12.9* 13.5  HCT 41.5 38.9* 39.6  MCV 89.4 89.8 89.4  MCH 30.2 29.8 30.5  MCHC 33.7 33.2 34.1  RDW 11.6 11.6 11.4*  PLT 168 174 184   Thyroid No results for input(s): TSH, FREET4 in the last 168 hours.  BNPNo results for input(s): BNP, PROBNP in the last 168 hours.  DDimer No  results for input(s): DDIMER in the last 168 hours.   Radiology    NM Myocar Multi W/Spect W/Wall Motion / EF  Result Date: 04/07/2021 CLINICAL DATA:  Preop for noncardiac surgery.  Chest pain. EXAM: MYOCARDIAL IMAGING WITH SPECT (REST AND PHARMACOLOGIC-STRESS) GATED LEFT VENTRICULAR WALL MOTION STUDY LEFT VENTRICULAR EJECTION FRACTION TECHNIQUE: Standard myocardial SPECT imaging was performed after resting intravenous injection of 10.3 mCi Tc-70m tetrofosmin. Subsequently, intravenous infusion of Lexiscan was performed under the supervision of the Cardiology staff. At peak effect of the drug, 30.1 mCi Tc-13m tetrofosmin was injected intravenously and standard myocardial SPECT imaging was performed. Quantitative gated imaging was also performed to evaluate left ventricular wall motion, and estimate left ventricular ejection fraction. COMPARISON:  None. FINDINGS: Perfusion: Large fixed defects are seen involving the anterior, apical and inferior myocardium consistent with old infarctions. Mild reversibility is seen involving the margins of the defect in the anterior myocardium consistent with peri-infarct ischemia. Wall Motion: Moderate global hypomotility is noted. Left Ventricular Ejection Fraction: 34 % End diastolic volume 123XX123 ml End systolic volume 123XX123 ml IMPRESSION: 1. Large fixed defects are seen involving the anterior, apical and inferior myocardium consistent with old infarctions. Mild peri-infarct ischemia is noted anteriorly. 2. Moderate global hypomotility is noted. 3. Left ventricular ejection fraction 34% 4. Non invasive risk stratification*: High *2012 Appropriate Use Criteria for Coronary Revascularization Focused Update: J Am Coll Cardiol. [id number]. http://content.[email or web address].aspx?articleid=[email or web address] Electronically Signed   By: Marijo Conception M.D.   On: 04/07/2021 15:46    Cardiac Studies   2D echo 04/01/21   1. Global mild to moderate hypokinesis with akinesis of the  anterior,  anteroseptal wall and apex c/w LAD disease. Left ventricular ejection  fraction, by estimation, is 40 to 45%. The left ventricle has mildly  decreased function. The left  ventricle  demonstrates regional wall motion abnormalities (see scoring  diagram/findings for description). There is mild concentric left  ventricular hypertrophy. Left ventricular diastolic parameters are  indeterminate.   2. Right ventricular systolic function was not well visualized. The right  ventricular size is not well visualized.   3. Left atrial size was moderately dilated.   4. Right atrium is dilated.   5. The mitral valve is grossly normal. No evidence of mitral valve  regurgitation.   6. The aortic valve is grossly normal. Aortic valve regurgitation is  mild. Aortic valve sclerosis/calcification is present, without any  evidence of aortic stenosis.   7. The inferior vena cava not well visualized.    Lexiscan Myoview 04/07/21:  IMPRESSION: 1. Large fixed defects are seen involving the anterior, apical and inferior myocardium consistent with old infarctions. Mild peri-infarct ischemia is noted anteriorly.   2. Moderate global hypomotility is noted.   3. Left ventricular ejection fraction 34%   4. Non invasive risk stratification*: High  Abdominal aortogram 04/04/21: Findings: Patient has tortuous external iliac arteries.  The common iliac and aorta are calcified.  Renal arteries without any stenoses.  Right SFA is flush occluded there is a large profunda giving many collaterals.  There is an [place] of distal SFA.  He then reconstitutes intertibial artery which flows to the foot.  Posterior tibial artery appears to reconstitute but it occludes above the ankle.  On the left side his common femoral artery is calcified without any flow-limiting stenosis.  SFA is heavily calcified but patent down to the popliteal.  Trifurcation is patent.  All tibial vessels are heavily diseased with posterior tibial  being the dominant runoff.  Patient will be considered for right femoral to anterior tibial artery bypass and vein mapping will be ordered.  Patient Profile     Mr. Iskandar is an 24M with CAD s/p STEMI/CABG (123456), chronic systolic ingestive heart failure, sick sinus syndrome status post Biotronik pacemaker, NSVT, PVCs, hypertension, atrial flutter status post ablation admitted with CLI.   Assessment & Plan    # PAD/CLI:  # Pre-operative risk assessment: # CAD s/p CABG/PCI (2006): # Abnormal stress:  He was admitted for cellulitis and abscess.  He had ABIs that were abnormal and concerning for critical limb ischemia.  He had a PV angio that showed an occluded right SFA with many collaterals.  It reconstitutes in the intertibial artery and the tibial artery.  The tibial artery is occluded above the ankle.  He also has left common femoral artery and heavily calcified left SFA that was patent to the level of the popliteal.  He was recommended that he have a right femoral to anterior tibial bypass.  Given that his legs prohibit him from getting much exercise we got a Lexiscan Myoview yesterday.  Unfortunately, it was high risk.  He had a known prior inferior infarct.  However there is also concern for new anterior infarct with peri-infarct ischemia.  His last reported stress test was in 2018, at which time there was an inferior infarct but nothing else.  He has poorly controlled risk factors.  I am concerned that he may have occluded and LIMA to LAD and have unstable left main or proximal LAD disease.  We will plan to get a diagnostic cardiac catheterization on Monday.  Ideally, his CAD can be medically managed.  If possible, will avoid placing a stent as this would delay his needed peripheral revascularization.  He has already eaten lunch so we cannot  get a LHC today.   # Hyperlipidemia:   He declines statins as he reports having myalgias in the past.  I did tell him that we could to consider alternative  agents as an outpatient.  Interestingly, he notes that he has never had hyperlipidemia.  This admission his total cholesterol is 111 and his LDL is 71.  Recommend that he be seen in lipid clinic as an outpatient.  Would recommend an LDL goal less than 55 given his multivessel disease.  Continue aspirin.        For questions or updates, please contact [place] Please consult [email or web address] for contact info under        Signed, Skeet Latch, MD  04/08/2021, 12:57 PM

## 2021-04-09 DIAGNOSIS — R21 Rash and other nonspecific skin eruption: Secondary | ICD-10-CM

## 2021-04-09 DIAGNOSIS — L02619 Cutaneous abscess of unspecified foot: Secondary | ICD-10-CM | POA: Diagnosis not present

## 2021-04-09 DIAGNOSIS — I2510 Atherosclerotic heart disease of native coronary artery without angina pectoris: Secondary | ICD-10-CM | POA: Diagnosis not present

## 2021-04-09 DIAGNOSIS — L03119 Cellulitis of unspecified part of limb: Secondary | ICD-10-CM | POA: Diagnosis not present

## 2021-04-09 HISTORY — DX: Rash and other nonspecific skin eruption: R21

## 2021-04-09 MED ORDER — VANCOMYCIN HCL 1250 MG/250ML IV SOLN
1250.0000 mg | INTRAVENOUS | Status: DC
  Administered 2021-04-09: 1250 mg via INTRAVENOUS
  Filled 2021-04-09: qty 250

## 2021-04-09 MED ORDER — LORATADINE 10 MG PO TABS
10.0000 mg | ORAL_TABLET | Freq: Every day | ORAL | Status: DC
  Administered 2021-04-09 – 2021-04-16 (×7): 10 mg via ORAL
  Filled 2021-04-09 (×8): qty 1

## 2021-04-09 MED ORDER — FAMOTIDINE 20 MG PO TABS
20.0000 mg | ORAL_TABLET | Freq: Every day | ORAL | Status: AC
  Administered 2021-04-10 – 2021-04-11 (×2): 20 mg via ORAL
  Filled 2021-04-09 (×3): qty 1

## 2021-04-09 MED ORDER — VANCOMYCIN HCL 1250 MG/250ML IV SOLN
1250.0000 mg | Freq: Once | INTRAVENOUS | Status: AC
  Administered 2021-04-09: 1250 mg via INTRAVENOUS
  Filled 2021-04-09: qty 250

## 2021-04-09 MED ORDER — PIPERACILLIN-TAZOBACTAM 3.375 G IVPB
3.3750 g | Freq: Three times a day (TID) | INTRAVENOUS | Status: DC
  Administered 2021-04-09 – 2021-04-10 (×4): 3.375 g via INTRAVENOUS
  Filled 2021-04-09 (×4): qty 50

## 2021-04-09 NOTE — Progress Notes (Addendum)
Mobility Specialist Progress Note   04/09/21 1620  Mobility  Activity Ambulated with assistance in room  Level of Assistance Moderate assist, patient does 50-74%  Distance Ambulated (ft) 24 ft  Activity Response Tolerated fair  $Mobility charge 1 Mobility   Received pt in bed c/o irritation and pain on back, also feels weak d/t episodes of diarrhea, still inclined for mobility. Min A supine <> EOB for trunk support. Once sitting up, pt unable to don darco or tennis shoe. Mod A STS in one attempt and contact guard to steady. x4 marches in places that caused irritation on RLE. X1 seated break d/t inc in pain but pt motivated to ambulate in room. Returned back to bed w/ food tray in front, call bell by side and RN notified about pain level and irritation on back.      Frederico Hamman Mobility Specialist Phone Number [phone]

## 2021-04-09 NOTE — Progress Notes (Addendum)
Progress Note   Patient: [name]:[id number] DOB: 1933/06/03 DOA: 03/31/2021     8 DOS: the patient was seen and examined on 04/09/2021   Brief hospital course:  88 y.o. male with medical history significant of coronary artery disease, ischemic cardiomyopathy with chronic systolic CHF with prior EF of 15%, history of CABG in 2006, a flutter status post ablation in 2014, VT, also sick sinus syndrome status post pacemaker.  He has been dealing with a right lower extremity callus for several weeks, seen as an outpatient by podiatry.  Over the last 3 to 4 days, he has noticed increased pain to the right lower extremity, increased swelling and redness.  He has a degree of neuropathy and does not feel much.  He was seen in podiatrist office and was sent to the hospital for cellulitis.  Vascular surgery as well as podiatry consulted, arteriogram shows significant right lower extremity arterial disease now being considered for bypass surgery but first needs cardiology evaluation.  Stress test high risk so now cath is being planned for Monday.   Assessment and Plan * Cellulitis and abscess of foot- (present on admission) - Patient admitted with right foot cellulitis and abscess.  - Could not get an MRI due to pacemaker.  -initially: pus draining from right greater toe/Cellulitis extends up to mid upper shins.  -antibiotics-- IV vanc/zosyn initially, narrowed to rocephin but patient developed an itching rash (unclear if related to new abx but timing coincides)- changed back to vanc/zosyn (also redness on IV rocephin seems to have worsened) -Blood cultures: no growth to date.   -Vascular surgery consulted, underwent arteriogram 1/23, no possible intervention and he is to be considered for right femoral to anterior tibial artery bypass.  Pending cardiac evaluation prior to surgery- now plans for [facility] on Monday -Podiatry consulted as well:.  Bone scan does not show osteomyelitis  Critical limb ischemia  of both lower extremities [facility]) Per vascular: rescheduled his surgery for Monday, 1/30 (as LHC planned for this date suspect this will need to be moved).   The patient does not have usable vein, and therefore PTFE will be used as the conduit. Being that he does not have osteomyelitis, will discuss plans with podiatry prior to or He is aware this is a high risk surgery, and that plastic conduit has a higher failure rate.  Unfortunately, his only options are amputation or bypass.  Chronic systolic CHF (congestive heart failure) (HCC) -Last seen cardiology at [facility], Dr. Sampson Goon, in 2018.  It was mentioned that the EF at that time was 40 to 45%, and recently had a stress test which was negative.  2D echo done here shows EF 40-45% with mildly decreased LV function.  Suspect unchanged.  He has no heart failure symptoms, no angina.  Currently he is not on goal-directed therapy, not clear when he stopped the medications but back in 2018 he was on metoprolol 12.5 twice daily as well as aspirin.  Discussed with the daughter, he has self discontinued the medication as he just does not like to take pills.  Currently appears stable.  Vascular surgery request cardiology evaluation for restratification pre-bypass surgery.  -LHC for monday  Essential hypertension-resolved as of 04/06/2021 -Monitor blood pressure, currently not taking any medications, has remained within acceptable parameters   Rash -unclear cause-- suspect abx as timing seems to co-incide, doubt pain medication. Patient thinks related to bandage on his lower back -benadryl/H2/H1 blocker  Urinary hesitancy - Possibly due to BPH.  This is been going on for quite some time, but most recently has noticed that he needs to urinate more often during the nighttime.  He is supposed to see urology outpatient - started on Flomax while here, no further issues, urinating well  Paroxysmal atrial flutter (HCC) - history of ablation, now has pacemaker.   Apparently he was on Coumadin in the past, and per care everywhere notes, in 2018, his Coumadin was stopped as he had no recurrence for A. fib or flutter for 1.5 years at that time.  CAD (coronary artery disease) with history of CABG -noted, no chest pain or apparent active issues -cath Monday  Macular degeneration - Noted  Dizziness -Chronic   Hyperlipidemia- (present on admission) - Has not been taking statin at home.  LDL at 71  Hx of CABG - Noted  PAD (peripheral artery disease) (HCC) -vascular consulted.  Arteriogram as above  Sick sinus syndrome (HCC) status post pacemaker- (present on admission) - Keep on medical telemetry.  He is paced    Subjective: does not like to keep his foot elevated  Objective Vitals:   04/09/21 0316 04/09/21 0413 04/09/21 0730 04/09/21 1100  BP: (!) 121/56  124/70 (!) 123/53  Pulse: 66  71 80  Resp: 17  18 15   Temp: 98.4 F (36.9 C)  98.2 F (36.8 C) 98.1 F (36.7 C)  TempSrc: Oral  Oral Oral  SpO2: 96%  97% 99%  Weight:  86.3 kg    Height:        General: Appearance:     Overweight male in no acute distress   Skin: red, raised rash on mainly trunk/arms, pruritic   Lungs:      respirations unlabored  Heart:    Normal heart rate.   MS:   All extremities are intact.    Neurologic:   Awake, alert     Data Reviewed: Labs ordered for the AM  Family Communication: spoke with daughter 1/27  Disposition: Status is: Inpatient  Remains inpatient appropriate because: needs LHC then vascular procedure       Time spent: 35 minutes  Author: 2/27, DO 04/09/2021 2:05 PM  For on call review [email or web address].

## 2021-04-09 NOTE — Assessment & Plan Note (Signed)
-  unclear cause-- suspect abx as timing seems to co-incide, doubt pain medication. Patient thinks related to bandage on his lower back -benadryl/H2/H1 blocker

## 2021-04-09 NOTE — Progress Notes (Signed)
Pharmacy Antibiotic Note  [name] is a 88 y.o. male admitted on 03/31/2021 with  toe wound infection .  Pharmacy has been consulted for Vanco, Zosyn dosing.   ID: RLE cellulitis and abscess, pus draining from greater toe Concern for gas forming pathogens, no osteo on imaging S/p aortogram 1/23 Afebrile, WBC WNL, LA 1.5  Vanc 1/19 >> 1/26, 1/28>> Zosyn 1/19 >> 1/26, 1/28>> Clinda 1/19 >> 1/23 CTX 1/26 >>1/28  1/19 BCx - negative   Plan: D/c Rocephin>>whole body rash Resume Vancomycin 1250mg /24h for AUC 470 with Scr 0.91 Resume Zosyn 3.375g IV q8hr. For anerobic coverage  Height: 5\' 10"  (177.8 cm) Weight: 86.3 kg (190 lb 4.1 oz) IBW/kg (Calculated) : 73  Temp (24hrs), Avg:98.2 F (36.8 C), Min:97.5 F (36.4 C), Max:98.5 F (36.9 C)  Recent Labs  Lab 04/03/21 0426 04/05/21 0401 04/06/21 0422 04/06/21 2045 04/08/21 0419  WBC 7.6 7.7 8.0  --  7.1  CREATININE 1.01 0.88 0.86  --  0.81  VANCOPEAK  --   --   --  28*  --     Estimated Creatinine Clearance: 68.8 mL/min (by C-G formula based on SCr of 0.81 mg/dL).    Allergies  Allergen Reactions   Amiodarone Nausea And Vomiting    vomiting    Gabapentin     dizziness     Metoprolol Other (See Comments)    Drop in heart rate, dizziness.     Statins     myalgias   Doxycycline     Unknown to patient    Neomycin     Unknown to patient     Niacin     Unknown to patient    Ramipril     Unknown to patient     Rocephin [Ceftriaxone] Rash    Whole body rash    Billy Kuwahara S. 2046, PharmD, BCPS Clinical Staff Pharmacist [email or web address]  04/10/21 04/09/2021 8:40 AM

## 2021-04-09 NOTE — Progress Notes (Signed)
Progress Note  Patient Name: [name] Date of Encounter: 04/09/2021  [facility] Cardiologist: Norman Herrlich, MD    Subjective    88 yo admitted with cellulitis and abscesses. Has severe PAD He had a pre - op myoview which was high risk He is scheduled for cath on Monday   He has a known prior inf. MI  Myoview is concerning for a recent ant MI with peri-infarct ischemia   Inpatient Medications    Scheduled Meds:  aspirin EC  81 mg Oral Daily   enoxaparin (LOVENOX) injection  40 mg Subcutaneous Q24H   famotidine  20 mg Oral Daily   loratadine  10 mg Oral Daily   melatonin  5 mg Oral QHS   pantoprazole  40 mg Oral Daily   sodium chloride flush  3 mL Intravenous Q12H   sodium chloride flush  3 mL Intravenous Q12H   tamsulosin  0.4 mg Oral QPC supper   Continuous Infusions:  sodium chloride     piperacillin-tazobactam (ZOSYN)  IV 3.375 g (04/09/21 0940)   [START ON 04/10/2021] vancomycin     PRN Meds: sodium chloride, acetaminophen, alum & mag hydroxide-simeth, bisacodyl, camphor-menthol, diphenhydrAMINE, hydrALAZINE, ondansetron (ZOFRAN) IV, oxyCODONE, sodium chloride flush   Vital Signs    Vitals:   04/09/21 0316 04/09/21 0413 04/09/21 0730 04/09/21 1100  BP: (!) 121/56  124/70 (!) 123/53  Pulse: 66  71 80  Resp: 17  18 15   Temp: 98.4 F (36.9 C)  98.2 F (36.8 C) 98.1 F (36.7 C)  TempSrc: Oral  Oral Oral  SpO2: 96%  97% 99%  Weight:  86.3 kg    Height:        Intake/Output Summary (Last 24 hours) at 04/09/2021 1214 Last data filed at 04/09/2021 0930 Gross per 24 hour  Intake 720 ml  Output 150 ml  Net 570 ml   Last 3 Weights 04/09/2021 04/08/2021 04/07/2021  Weight (lbs) 190 lb 4.1 oz 193 lb 12.6 oz 194 lb 0.1 oz  Weight (kg) 86.3 kg 87.9 kg 88 kg      Telemetry     Personally Reviewed  ECG     - Personally Reviewed  Physical Exam   GEN: No acute distress.  Elderly male  Neck: No JVD Cardiac: RRR, no murmurs, rubs, or gallops.   Respiratory: Clear to auscultation bilaterally. GI: Soft, nontender, non-distended  MS: No edema; No deformity. Neuro:  Nonfocal  Psych: Normal affect   Labs    High Sensitivity Troponin:  No results for input(s): TROPONINIHS in the last 720 hours.   Chemistry Recent Labs  Lab 04/05/21 0401 04/06/21 0422 04/08/21 0419  NA 136 136 137  K 3.6 3.7 3.4*  CL 106 108 107  CO2 21* 21* 22  GLUCOSE 96 103* 98  BUN 13 10 10   CREATININE 0.88 0.86 0.81  CALCIUM 8.1* 8.4* 8.4*  GFRNONAA >60 >60 >60  ANIONGAP 9 7 8     Lipids  Recent Labs  Lab 04/05/21 0401  CHOL 111  TRIG 44  HDL 31*  LDLCALC 71  CHOLHDL 3.6    Hematology Recent Labs  Lab 04/05/21 0401 04/06/21 0422 04/08/21 0419  WBC 7.7 8.0 7.1  RBC 4.64 4.33 4.43  HGB 14.0 12.9* 13.5  HCT 41.5 38.9* 39.6  MCV 89.4 89.8 89.4  MCH 30.2 29.8 30.5  MCHC 33.7 33.2 34.1  RDW 11.6 11.6 11.4*  PLT 168 174 184   Thyroid No results for input(s): TSH,  FREET4 in the last 168 hours.  BNPNo results for input(s): BNP, PROBNP in the last 168 hours.  DDimer No results for input(s): DDIMER in the last 168 hours.   Radiology    NM Myocar Multi W/Spect W/Wall Motion / EF  Result Date: 04/07/2021 CLINICAL DATA:  Preop for noncardiac surgery.  Chest pain. EXAM: MYOCARDIAL IMAGING WITH SPECT (REST AND PHARMACOLOGIC-STRESS) GATED LEFT VENTRICULAR WALL MOTION STUDY LEFT VENTRICULAR EJECTION FRACTION TECHNIQUE: Standard myocardial SPECT imaging was performed after resting intravenous injection of 10.3 mCi Tc-9m tetrofosmin. Subsequently, intravenous infusion of Lexiscan was performed under the supervision of the Cardiology staff. At peak effect of the drug, 30.1 mCi Tc-34m tetrofosmin was injected intravenously and standard myocardial SPECT imaging was performed. Quantitative gated imaging was also performed to evaluate left ventricular wall motion, and estimate left ventricular ejection fraction. COMPARISON:  None. FINDINGS: Perfusion:  Large fixed defects are seen involving the anterior, apical and inferior myocardium consistent with old infarctions. Mild reversibility is seen involving the margins of the defect in the anterior myocardium consistent with peri-infarct ischemia. Wall Motion: Moderate global hypomotility is noted. Left Ventricular Ejection Fraction: 34 % End diastolic volume 177 ml End systolic volume 117 ml IMPRESSION: 1. Large fixed defects are seen involving the anterior, apical and inferior myocardium consistent with old infarctions. Mild peri-infarct ischemia is noted anteriorly. 2. Moderate global hypomotility is noted. 3. Left ventricular ejection fraction 34% 4. Non invasive risk stratification*: High *2012 Appropriate Use Criteria for Coronary Revascularization Focused Update: J Am Coll Cardiol. 2012;59(9):857-881. http://content.[email or web address].aspx?articleid=[email or web address] Electronically Signed   By: Lupita Raider M.D.   On: 04/07/2021 15:46    Cardiac Studies      Patient Profile     88 y.o. male    Assessment & Plan     CAD :  has evidence of a previous Inf. MI .  Now has an anterior defect with peri-MI ischemia.  He needs to have vascular surgery .  I agree that a cath would allow [place] to have a better understanding of his degree of CAD prior to a large vascular surgery.    I have discussed the risks, benefits, options. He understands and agrees to proceed   Orders written,  case scheduled for Monday  by Ronie Spies, PA      For questions or updates, please contact [facility] Please consult [email or web address] for contact info under        Signed, Kristeen Miss, MD  04/09/2021, 12:14 PM

## 2021-04-10 DIAGNOSIS — Z950 Presence of cardiac pacemaker: Secondary | ICD-10-CM

## 2021-04-10 DIAGNOSIS — R21 Rash and other nonspecific skin eruption: Secondary | ICD-10-CM

## 2021-04-10 DIAGNOSIS — R931 Abnormal findings on diagnostic imaging of heart and coronary circulation: Secondary | ICD-10-CM

## 2021-04-10 DIAGNOSIS — I5022 Chronic systolic (congestive) heart failure: Secondary | ICD-10-CM | POA: Diagnosis not present

## 2021-04-10 DIAGNOSIS — I495 Sick sinus syndrome: Secondary | ICD-10-CM

## 2021-04-10 DIAGNOSIS — L02619 Cutaneous abscess of unspecified foot: Secondary | ICD-10-CM | POA: Diagnosis not present

## 2021-04-10 DIAGNOSIS — I739 Peripheral vascular disease, unspecified: Secondary | ICD-10-CM

## 2021-04-10 DIAGNOSIS — I2510 Atherosclerotic heart disease of native coronary artery without angina pectoris: Secondary | ICD-10-CM | POA: Diagnosis not present

## 2021-04-10 DIAGNOSIS — L03119 Cellulitis of unspecified part of limb: Secondary | ICD-10-CM | POA: Diagnosis not present

## 2021-04-10 LAB — BASIC METABOLIC PANEL
Anion gap: 9 (ref 5–15)
BUN: 13 mg/dL (ref 8–23)
CO2: 20 mmol/L — ABNORMAL LOW (ref 22–32)
Calcium: 8.4 mg/dL — ABNORMAL LOW (ref 8.9–10.3)
Chloride: 107 mmol/L (ref 98–111)
Creatinine, Ser: 0.94 mg/dL (ref 0.61–1.24)
GFR, Estimated: >60 mL/min (ref >60)
Glucose, Bld: 99 mg/dL (ref 70–99)
Potassium: 3.9 mmol/L (ref 3.5–5.1)
Sodium: 136 mmol/L (ref 135–145)

## 2021-04-10 LAB — MRSA NEXT GEN BY PCR, NASAL: MRSA by PCR Next Gen: NOT DETECTED

## 2021-04-10 LAB — CBC
HCT: 43.9 % (ref 39.0–52.0)
Hemoglobin: 14.1 g/dL (ref 13.0–17.0)
MCH: 29.5 pg (ref 26.0–34.0)
MCHC: 32.1 g/dL (ref 30.0–36.0)
MCV: 91.8 fL (ref 80.0–100.0)
Platelets: 210 10*3/uL (ref 150–400)
RBC: 4.78 MIL/uL (ref 4.22–5.81)
RDW: 11.7 % (ref 11.5–15.5)
WBC: 12.5 10*3/uL — ABNORMAL HIGH (ref 4.0–10.5)
nRBC: 0 % (ref 0.0–0.2)

## 2021-04-10 MED ORDER — ASPIRIN 81 MG PO CHEW
81.0000 mg | CHEWABLE_TABLET | ORAL | Status: AC
  Administered 2021-04-11: 81 mg via ORAL
  Filled 2021-04-10: qty 1

## 2021-04-10 MED ORDER — SODIUM CHLORIDE 0.9% FLUSH: 3.0000 mL | INTRAVENOUS | Status: DC | PRN

## 2021-04-10 MED ORDER — SODIUM CHLORIDE 0.9 % IV SOLN: 250.0000 mL | INTRAVENOUS | Status: DC | PRN

## 2021-04-10 MED ORDER — METRONIDAZOLE 500 MG/100ML IV SOLN
500.0000 mg | Freq: Two times a day (BID) | INTRAVENOUS | Status: DC
  Administered 2021-04-10 – 2021-04-14 (×8): 500 mg via INTRAVENOUS
  Filled 2021-04-10 (×9): qty 100

## 2021-04-10 MED ORDER — SODIUM CHLORIDE 0.9 % IV SOLN: INTRAVENOUS | Status: DC

## 2021-04-10 MED ORDER — LEVOFLOXACIN IN D5W 500 MG/100ML IV SOLN
500.0000 mg | INTRAVENOUS | Status: DC
  Administered 2021-04-10 – 2021-04-13 (×4): 500 mg via INTRAVENOUS
  Filled 2021-04-10 (×5): qty 100

## 2021-04-10 NOTE — Progress Notes (Signed)
Progress Note  Patient Name: [name] Date of Encounter: 04/10/2021  [facility] Cardiologist: Norman HerrlichBrian Munley, MD   Subjective   Widespread pruritic papular erythematous rash (presumably allergic reaction to antibiotics). Denies angina or dyspnea.  Currently without any leg pain.   Inpatient Medications    Scheduled Meds:  [START ON 04/11/2021] aspirin  81 mg Oral Pre-Cath   aspirin EC  81 mg Oral Daily   enoxaparin (LOVENOX) injection  40 mg Subcutaneous Q24H   famotidine  20 mg Oral Daily   loratadine  10 mg Oral Daily   melatonin  5 mg Oral QHS   pantoprazole  40 mg Oral Daily   sodium chloride flush  3 mL Intravenous Q12H   sodium chloride flush  3 mL Intravenous Q12H   tamsulosin  0.4 mg Oral QPC supper   Continuous Infusions:  sodium chloride     sodium chloride     [START ON 04/11/2021] sodium chloride     piperacillin-tazobactam (ZOSYN)  IV 3.375 g (04/10/21 0130)   vancomycin 1,250 mg (04/09/21 2330)   PRN Meds: sodium chloride, sodium chloride, acetaminophen, alum & mag hydroxide-simeth, bisacodyl, camphor-menthol, diphenhydrAMINE, hydrALAZINE, ondansetron (ZOFRAN) IV, oxyCODONE, sodium chloride flush, sodium chloride flush   Vital Signs    Vitals:   04/09/21 2320 04/10/21 0331 04/10/21 0500 04/10/21 0747  BP: 114/67 (!) 112/99  (!) 126/59  Pulse: 62 74  (!) 59  Resp: 17 17  18   Temp: 98 F (36.7 C) 98.2 F (36.8 C)  98.1 F (36.7 C)  TempSrc: Oral Oral  Oral  SpO2: 97% 96%  98%  Weight:   86.9 kg   Height:        Intake/Output Summary (Last 24 hours) at 04/10/2021 1012 Last data filed at 04/09/2021 1547 Gross per 24 hour  Intake 435.02 ml  Output 350 ml  Net 85.02 ml   Last 3 Weights 04/10/2021 04/09/2021 04/08/2021  Weight (lbs) 191 lb 9.3 oz 190 lb 4.1 oz 193 lb 12.6 oz  Weight (kg) 86.9 kg 86.3 kg 87.9 kg      Telemetry    Atrial paced, ventricular sensed rhythm- Personally Reviewed  ECG    Atrial paced, ventricular sensed  rhythm, RBBB plus LAFB, Q waves V1-V4- Personally Reviewed  Physical Exam  Overweight.  Generalized papular erythematous rash GEN: No acute distress.   Neck: No JVD Cardiac: RRR, widely split S2, no murmurs, rubs, or gallops.  Respiratory: Clear to auscultation bilaterally. GI: Soft, nontender, non-distended  MS: No edema; No deformity. Neuro:  Nonfocal  Psych: Normal affect   Labs    High Sensitivity Troponin:  No results for input(s): TROPONINIHS in the last 720 hours.   Chemistry Recent Labs  Lab 04/06/21 0422 04/08/21 0419 04/10/21 0245  NA 136 137 136  K 3.7 3.4* 3.9  CL 108 107 107  CO2 21* 22 20*  GLUCOSE 103* 98 99  BUN 10 10 13   CREATININE 0.86 0.81 0.94  CALCIUM 8.4* 8.4* 8.4*  GFRNONAA >60 >60 >60  ANIONGAP 7 8 9     Lipids  Recent Labs  Lab 04/05/21 0401  CHOL 111  TRIG 44  HDL 31*  LDLCALC 71  CHOLHDL 3.6    Hematology Recent Labs  Lab 04/06/21 0422 04/08/21 0419 04/10/21 0245  WBC 8.0 7.1 12.5*  RBC 4.33 4.43 4.78  HGB 12.9* 13.5 14.1  HCT 38.9* 39.6 43.9  MCV 89.8 89.4 91.8  MCH 29.8 30.5 29.5  MCHC 33.2 34.1  32.1  RDW 11.6 11.4* 11.7  PLT 174 184 210   Thyroid No results for input(s): TSH, FREET4 in the last 168 hours.  BNPNo results for input(s): BNP, PROBNP in the last 168 hours.  DDimer No results for input(s): DDIMER in the last 168 hours.   Radiology    No results found.  Cardiac Studies   2D echo 04/01/21   1. Global mild to moderate hypokinesis with akinesis of the anterior,  anteroseptal wall and apex c/w LAD disease. Left ventricular ejection  fraction, by estimation, is 40 to 45%. The left ventricle has mildly  decreased function. The left ventricle  demonstrates regional wall motion abnormalities (see scoring  diagram/findings for description). There is mild concentric left  ventricular hypertrophy. Left ventricular diastolic parameters are  indeterminate.   2. Right ventricular systolic function was not well  visualized. The right  ventricular size is not well visualized.   3. Left atrial size was moderately dilated.   4. Right atrium is dilated.   5. The mitral valve is grossly normal. No evidence of mitral valve  regurgitation.   6. The aortic valve is grossly normal. Aortic valve regurgitation is  mild. Aortic valve sclerosis/calcification is present, without any  evidence of aortic stenosis.   7. The inferior vena cava not well visualized.     Lexiscan Myoview 04/07/21:  IMPRESSION: 1. Large fixed defects are seen involving the anterior, apical and inferior myocardium consistent with old infarctions. Mild peri-infarct ischemia is noted anteriorly.   2. Moderate global hypomotility is noted.   3. Left ventricular ejection fraction 34%   4. Non invasive risk stratification*: High   Abdominal aortogram 04/04/21: Findings: Patient has tortuous external iliac arteries.  The common iliac and aorta are calcified.  Renal arteries without any stenoses.  Right SFA is flush occluded there is a large profunda giving many collaterals.  There is an [place] of distal SFA.  He then reconstitutes intertibial artery which flows to the foot.  Posterior tibial artery appears to reconstitute but it occludes above the ankle.  On the left side his common femoral artery is calcified without any flow-limiting stenosis.  SFA is heavily calcified but patent down to the popliteal.  Trifurcation is patent.  All tibial vessels are heavily diseased with posterior tibial being the dominant runoff.  Patient will be considered for right femoral to anterior tibial artery bypass and vein mapping will be ordered.  Patient Profile     88 y.o. 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  Due to severity of PAD plan for right SFA to anterior tibial bypass surgery, with a request for preoperative cardiac clearance.  Assessment & Plan    Scheduled for cardiac catheterization and coronary angiography tomorrow with Dr. Thayer Ohm End at 1330 hrs. This procedure has been fully reviewed with the patient and written informed consent has been obtained. ECG suggest that he has a completed anteroseptal infarction in addition to his old inferior infarction.  Nuclear study suggest some degree of peri-infarct ischemia.  Ideally, with plan medical management of CAD since he requires peripheral revascularization for critical limb ischemia. Although his lipid profile is not bad with total cholesterol 111 and LDL  71, he has evidence of progression of disease and is a high risk patient, with poor future prognosis.  Target LDL less than 75.  Strongly recommend PCSK9 inhibitors after discharge. Normally functioning dual-chamber permanent pacemaker.  He is not pacemaker dependent.     For questions or updates, please contact [facility] Please consult [email or web address] for contact info under        Signed, Thurmon Fair, MD  04/10/2021, 10:12 AM

## 2021-04-10 NOTE — Progress Notes (Signed)
Mobility Specialist Progress Note    04/10/21 1245  Mobility  Activity Transferred from bed to chair;Ambulated with assistance in room  Level of Assistance Moderate assist, patient does 50-74%  Assistive Device Front wheel walker  Distance Ambulated (ft) 8 ft  Activity Response Tolerated fair  $Mobility charge 1 Mobility   Limited today by pain, entire back highly irritable but pt inclined to mobility. Min A to EOB for trunk support, pt unable to don darco or tennis shoe for feet. Mod A STS w/ very waddle like gait d/t sensitivity and pain in R toe. Ended session early d/t oozing of cyst on R toe, left in chair w/ call bell in reach and RN notified.   Holland Falling Mobility Specialist Phone Number [phone]

## 2021-04-10 NOTE — Progress Notes (Signed)
Progress Note   Patient: [name] UXL:[id number] DOB: [birth year] DOA: 03/31/2021     9 DOS: the patient was seen and examined on 04/10/2021   Brief hospital course:  88 y.o. male with medical history significant of coronary artery disease, ischemic cardiomyopathy with chronic systolic CHF with prior EF of 53%, history of CABG in 2006, a flutter status post ablation in 2014, VT, also sick sinus syndrome status post pacemaker.  He has been dealing with a right lower extremity callus for several weeks, seen as an outpatient by podiatry.  Over the last 3 to 4 days, he has noticed increased pain to the right lower extremity, increased swelling and redness.  He has a degree of neuropathy and does not feel much.  He was seen in podiatrist office and was sent to the hospital.  Vascular surgery as well as podiatry consulted, arteriogram shows significant right lower extremity arterial disease now being considered for bypass surgery   Assessment and Plan * Cellulitis and abscess of foot- (present on admission) - Patient admitted with right foot cellulitis and abscess.  - Could not get an MRI due to pacemaker.  -initially: pus draining from right greater toe/Cellulitis extends up to mid upper shins.  -antibiotics-- IV vanc/zosyn initially, narrowed to rocephin but patient developed an itching rash (unclear if related to new abx but timing coincides)- changed back to vanc/zosyn (also redness on IV rocephin seems to have worsened). Rash is unchanged, not improving. Will stop zosyn/vanc and start flagyl/levo. Would consider adding linezolid if clinical worsening for mrsa coverage -Blood cultures: no growth to date. Wound culture skin flora -Vascular surgery consulted, underwent arteriogram 1/23, no possible intervention and he is to be considered for right femoral to anterior tibial artery bypass.  Pending cardiac evaluation prior to surgery- now plans for [facility] on Monday -Podiatry consulted as well:.  Bone scan  does not show osteomyelitis  Drug rash -unclear cause-- suspect abx as timing seems to co-incide, doubt pain medication. Patient thinks related to bandage on his lower back -benadryl/H2/H1 blocker - abx change as above  Critical limb ischemia of both lower extremities [facility]) Per vascular: rescheduled his surgery for Monday, 1/30 (as LHC planned for this date suspect this will need to be moved).   The patient does not have usable vein, and therefore PTFE will be used as the conduit. Being that he does not have osteomyelitis, will discuss plans with podiatry prior to or He is aware this is a high risk surgery, and that plastic conduit has a higher failure rate.  Unfortunately, his only options are amputation or bypass.  Urinary hesitancy - Possibly due to BPH.  This is been going on for quite some time, but most recently has noticed that he needs to urinate more often during the nighttime.  He is supposed to see urology outpatient - started on Flomax while here, no further issues, urinating well  Paroxysmal atrial flutter (HCC) - history of ablation, now has pacemaker.  Apparently he was on Coumadin in the past, and per care everywhere notes, in 2018, his Coumadin was stopped as he had no recurrence for A. fib or flutter for 1.5 years at that time.  PAD (peripheral artery disease) (HCC) -vascular consulted.  Arteriogram as above  Macular degeneration - Noted  Chronic systolic CHF (congestive heart failure) (HCC) -Last seen cardiology at [facility], Dr. Sampson Goon, in 2018.  It was mentioned that the EF at that time was 40 to 45%, and recently had a stress  test which was negative.  2D echo done here shows EF 40-45% with mildly decreased LV function.  Suspect unchanged.  He has no heart failure symptoms, no angina.  Currently he is not on goal-directed therapy, not clear when he stopped the medications but back in 2018 he was on metoprolol 12.5 twice daily as well as aspirin.  Discussed with the  daughter, he has self discontinued the medication as he just does not like to take pills.  Currently appears stable.  Vascular surgery request cardiology evaluation for restratification pre-bypass surgery.  -LHC for monday  Sick sinus syndrome (HCC) status post pacemaker- (present on admission) - Keep on medical telemetry.  He is paced  Dizziness -Chronic   Hyperlipidemia- (present on admission) - Has not been taking statin at home.  LDL at 71  Hx of CABG - Noted  CAD (coronary artery disease) with history of CABG -noted, no chest pain or apparent active issues -cath Monday  Essential hypertension-resolved as of 04/06/2021 -Monitor blood pressure, currently not taking any medications, has remained within acceptable parameters     Subjective: does not like to keep his foot elevated  Objective Vitals:   04/10/21 0331 04/10/21 0500 04/10/21 0747 04/10/21 1032  BP: (!) 112/99  (!) 126/59 (!) 134/53  Pulse: 74  (!) 59 71  Resp: 17  18 17   Temp: 98.2 F (36.8 C)  98.1 F (36.7 C) 98.1 F (36.7 C)  TempSrc: Oral  Oral Oral  SpO2: 96%  98% 99%  Weight:  86.9 kg    Height:        General: Appearance:     Overweight male in no acute distress   Skin: red, raised rash on mainly trunk/arms, pruritic   Lungs:      respirations unlabored  Heart:    Normal heart rate.   MS:   All extremities are intact.    Neurologic:   Awake, alert     Data Reviewed: Labs ordered for the AM  Family Communication: spoke with daughter 1/29 at bedside  Disposition: Status is: Inpatient  Remains inpatient appropriate because: needs LHC then vascular procedure       Time spent: 35 minutes  Author: 2/29, MD 04/10/2021 12:41 PM  For on call review [email or web address]/12/[email or web address].

## 2021-04-11 ENCOUNTER — Encounter (HOSPITAL_COMMUNITY)
Admission: EM | Disposition: A | Discharge status: Skilled Nursing Facility | Payer: Self-pay | Source: Home / Self Care | Attending: Internal Medicine

## 2021-04-11 DIAGNOSIS — R931 Abnormal findings on diagnostic imaging of heart and coronary circulation: Secondary | ICD-10-CM | POA: Diagnosis not present

## 2021-04-11 DIAGNOSIS — I2510 Atherosclerotic heart disease of native coronary artery without angina pectoris: Secondary | ICD-10-CM | POA: Diagnosis not present

## 2021-04-11 DIAGNOSIS — E782 Mixed hyperlipidemia: Secondary | ICD-10-CM | POA: Diagnosis not present

## 2021-04-11 DIAGNOSIS — R9439 Abnormal result of other cardiovascular function study: Secondary | ICD-10-CM | POA: Diagnosis not present

## 2021-04-11 DIAGNOSIS — I70223 Atherosclerosis of native arteries of extremities with rest pain, bilateral legs: Secondary | ICD-10-CM | POA: Diagnosis not present

## 2021-04-11 HISTORY — PX: LEFT HEART CATH AND CORS/GRAFTS ANGIOGRAPHY: CATH118250

## 2021-04-11 LAB — CBC
HCT: 42.2 % (ref 39.0–52.0)
Hemoglobin: 13.8 g/dL (ref 13.0–17.0)
MCH: 29.4 pg (ref 26.0–34.0)
MCHC: 32.7 g/dL (ref 30.0–36.0)
MCV: 90 fL (ref 80.0–100.0)
Platelets: 217 10*3/uL (ref 150–400)
RBC: 4.69 MIL/uL (ref 4.22–5.81)
RDW: 11.9 % (ref 11.5–15.5)
WBC: 10.7 10*3/uL — ABNORMAL HIGH (ref 4.0–10.5)
nRBC: 0 % (ref 0.0–0.2)

## 2021-04-11 LAB — BASIC METABOLIC PANEL
Anion gap: 11 (ref 5–15)
BUN: 14 mg/dL (ref 8–23)
CO2: 21 mmol/L — ABNORMAL LOW (ref 22–32)
Calcium: 8.5 mg/dL — ABNORMAL LOW (ref 8.9–10.3)
Chloride: 104 mmol/L (ref 98–111)
Creatinine, Ser: 0.98 mg/dL (ref 0.61–1.24)
GFR, Estimated: >60 mL/min (ref >60)
Glucose, Bld: 102 mg/dL — ABNORMAL HIGH (ref 70–99)
Potassium: 3.9 mmol/L (ref 3.5–5.1)
Sodium: 136 mmol/L (ref 135–145)

## 2021-04-11 SURGERY — LEFT HEART CATH AND CORS/GRAFTS ANGIOGRAPHY
Anesthesia: LOCAL

## 2021-04-11 SURGERY — CREATION, BYPASS, ARTERIAL, FEMORAL TO TIBIAL, USING GRAFT
Anesthesia: General | Laterality: Right

## 2021-04-11 MED ORDER — FENTANYL CITRATE (PF) 100 MCG/2ML IJ SOLN
INTRAMUSCULAR | Status: AC
  Filled 2021-04-11: qty 2

## 2021-04-11 MED ORDER — IOHEXOL 350 MG/ML SOLN
INTRAVENOUS | Status: DC | PRN
  Administered 2021-04-11: 70 mL

## 2021-04-11 MED ORDER — SODIUM CHLORIDE 0.9 % IV SOLN
250.0000 mL | INTRAVENOUS | Status: DC | PRN
  Administered 2021-04-12: 250 mL via INTRAVENOUS

## 2021-04-11 MED ORDER — SODIUM CHLORIDE 0.9 % IV SOLN: INTRAVENOUS | Status: AC

## 2021-04-11 MED ORDER — VERAPAMIL HCL 2.5 MG/ML IV SOLN
INTRAVENOUS | Status: DC | PRN
  Administered 2021-04-11: 10 mL via INTRA_ARTERIAL

## 2021-04-11 MED ORDER — HYDROXYZINE HCL 10 MG/5ML PO SYRP
10.0000 mg | ORAL_SOLUTION | Freq: Three times a day (TID) | ORAL | Status: DC | PRN
  Filled 2021-04-11: qty 5

## 2021-04-11 MED ORDER — METHYLPREDNISOLONE SODIUM SUCC 125 MG IJ SOLR
125.0000 mg | Freq: Once | INTRAMUSCULAR | Status: AC
  Administered 2021-04-11: 125 mg via INTRAVENOUS
  Filled 2021-04-11: qty 2

## 2021-04-11 MED ORDER — LIDOCAINE HCL (PF) 1 % IJ SOLN
INTRAMUSCULAR | Status: AC
  Filled 2021-04-11: qty 30

## 2021-04-11 MED ORDER — MIDAZOLAM HCL 2 MG/2ML IJ SOLN
INTRAMUSCULAR | Status: AC
  Filled 2021-04-11: qty 2

## 2021-04-11 MED ORDER — HEPARIN (PORCINE) IN NACL 1000-0.9 UT/500ML-% IV SOLN
INTRAVENOUS | Status: DC | PRN
  Administered 2021-04-11 (×2): 500 mL

## 2021-04-11 MED ORDER — HEPARIN SODIUM (PORCINE) 1000 UNIT/ML IJ SOLN
INTRAMUSCULAR | Status: AC
  Filled 2021-04-11: qty 10

## 2021-04-11 MED ORDER — ENOXAPARIN SODIUM 40 MG/0.4ML IJ SOSY
40.0000 mg | PREFILLED_SYRINGE | INTRAMUSCULAR | Status: DC
  Administered 2021-04-11 – 2021-04-15 (×5): 40 mg via SUBCUTANEOUS
  Filled 2021-04-11 (×5): qty 0.4

## 2021-04-11 MED ORDER — HEPARIN SODIUM (PORCINE) 1000 UNIT/ML IJ SOLN
INTRAMUSCULAR | Status: DC | PRN
  Administered 2021-04-11: 4500 [IU] via INTRAVENOUS

## 2021-04-11 MED ORDER — LIDOCAINE HCL (PF) 1 % IJ SOLN
INTRAMUSCULAR | Status: DC | PRN
  Administered 2021-04-11: 2 mL

## 2021-04-11 MED ORDER — SODIUM CHLORIDE 0.9% FLUSH: 3.0000 mL | INTRAVENOUS | Status: DC | PRN

## 2021-04-11 MED ORDER — FENTANYL CITRATE (PF) 100 MCG/2ML IJ SOLN
INTRAMUSCULAR | Status: DC | PRN
  Administered 2021-04-11: 25 ug via INTRAVENOUS

## 2021-04-11 MED ORDER — OXYCODONE HCL 5 MG PO TABS
ORAL_TABLET | ORAL | Status: AC
  Filled 2021-04-11: qty 1

## 2021-04-11 MED ORDER — VERAPAMIL HCL 2.5 MG/ML IV SOLN
INTRAVENOUS | Status: AC
  Filled 2021-04-11: qty 2

## 2021-04-11 MED ORDER — SODIUM CHLORIDE 0.9% FLUSH
3.0000 mL | Freq: Two times a day (BID) | INTRAVENOUS | Status: DC
  Administered 2021-04-11 – 2021-04-15 (×8): 3 mL via INTRAVENOUS

## 2021-04-11 MED ORDER — HYDRALAZINE HCL 20 MG/ML IJ SOLN: 10.0000 mg | INTRAMUSCULAR | Status: AC | PRN

## 2021-04-11 MED ORDER — HEPARIN (PORCINE) IN NACL 1000-0.9 UT/500ML-% IV SOLN
INTRAVENOUS | Status: AC
  Filled 2021-04-11: qty 500

## 2021-04-11 SURGICAL SUPPLY — 11 items
CATH INFINITI 5 FR IM (CATHETERS) ×1 IMPLANT
CATH INFINITI 5FR AL1 (CATHETERS) ×1 IMPLANT
CATH INFINITI 5FR MULTPACK ANG (CATHETERS) ×1 IMPLANT
DEVICE RAD COMP TR BAND LRG (VASCULAR PRODUCTS) ×1 IMPLANT
GLIDESHEATH SLEND SS 6F .021 (SHEATH) ×1 IMPLANT
GUIDEWIRE INQWIRE 1.5J.035X260 (WIRE) IMPLANT
INQWIRE 1.5J .035X260CM (WIRE) ×4
KIT HEART LEFT (KITS) ×2 IMPLANT
PACK CARDIAC CATHETERIZATION (CUSTOM PROCEDURE TRAY) ×2 IMPLANT
TRANSDUCER W/STOPCOCK (MISCELLANEOUS) ×2 IMPLANT
TUBING CIL FLEX 10 FLL-RA (TUBING) ×2 IMPLANT

## 2021-04-11 NOTE — Progress Notes (Signed)
Mobility Specialist Progress Note   04/11/21 1500  Mobility  Activity  (Bed Mobility)  Range of Motion/Exercises All extremities  Assistive Device None  RLE Weight Bearing WBAT  Activity Response Tolerated well  $Mobility charge 1 Mobility   Pt has a procedure today, in addition also had PT and OT this morning. Pt stating to be tired but agreeable to mobility. Remained in bed to reserve energy and worked on bed mobility exercise that pt was able to perform well w/o complaint. Left in bed w/ call bell in reach and needs met.  Holland Falling Mobility Specialist Phone Number [phone]

## 2021-04-11 NOTE — Progress Notes (Signed)
Occupational Therapy Treatment Patient Details Name: [name] MRN: [id number] DOB: Nov 09, 1933 Today's Date: 04/11/2021  Clinical Impression: Pt today in urgent need of BSC, supervision for bed mobility, min guard for pivot to the left onto [facility] initially. While there addressed increasing need for support of LB dressing (typically can perform figure 4 and is no longer able to with RLE). Stood x1 from [facility] with RW for OT to perform rear peri care (Pt unable to hold standing today without BUE support on RW) and then min A for transfer back to bed from [facility]. Pt continues to benefit from skilled OT in the acute setting and POC remains appropriate. We will need to confirm that his daughters can assist with food, transport - or Pt might need increased level of care.    04/11/21 1000  OT Visit Information  Last OT Received On 04/11/21  Assistance Needed +1  History of Present Illness 88 y.o. male presents to ED with RLE wound being followed by outpatient podiatry. Increased pain over last 3-4 days, and outpatient podiatrist sent to ED 03/31/21. Foot x-ray shows concern for soft tissue gas/infection with a gas-forming organism. 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 s/p R common femoral artery and RLE angiogram, LLE angiography 1/23; pt to be considered for R femoral to anterior tibial artery bypass. PMH: coronary artery disease, ischemic cardiomyopathy with chronic systolic CHF with prior EF of 81%, history of CABG in 2006, a flutter status post ablation in 2014, VT, also sick sinus syndrome status post pacemaker.  Precautions  Precautions Fall  Precaution Comments R toe chronic wound - check for weeping, encouraged more heel WB during mobility, post-op shoe in pt's closet  Restrictions  Weight Bearing Restrictions Yes  RLE Weight Bearing WBAT  Pain Assessment  Pain Assessment Faces  Faces Pain Scale 4  Pain  Location R foot  Pain Descriptors / Indicators Throbbing;Sharp;Aching;Discomfort  Pain Intervention(s) Limited activity within patient's tolerance;Monitored during session;Repositioned  Cognition  Arousal/Alertness Awake/alert  Behavior During Therapy WFL for tasks assessed/performed  Overall Cognitive Status Within Functional Limits for tasks assessed  Upper Extremity Assessment  Upper Extremity Assessment Overall WFL for tasks assessed  ADL  Overall ADL's  Needs assistance/impaired  Eating/Feeding NPO  Eating/Feeding Details (indicate cue type and reason) pending sx  Grooming Wash/dry hands;Wash/dry face;Set up;Sitting  Grooming Details (indicate cue type and reason) on [facility]  Lower Body Dressing Maximal assistance;Sitting/lateral leans  Lower Body Dressing Details (indicate cue type and reason) RLE assist, before was I performing figure 4  Toilet Transfer Minimal assistance;Stand-pivot;BSC/3in1  Toilet Transfer Details (indicate cue type and reason) face to face, min guard  transferring to the left, needed more assist min A to transfer back to bed  Toileting- Clothing Manipulation and Hygiene Moderate assistance;Sit to/from stand  Toileting - Clothing Manipulation Details (indicate cue type and reason) use of RW to balance, OT performing rear peri care while Pt pushed up from [facility]  Functional mobility during ADLs Minimal assistance;Rolling walker (2 wheels)  Bed Mobility  Overal bed mobility Needs Assistance  Bed Mobility Supine to Sit;Sit to Supine  Supine to sit Supervision;HOB elevated  Sit to supine Min assist;HOB elevated (for RLE back into bed)  Transfers  Overall transfer level Needs assistance  Equipment used 1 person hand held assist;Rolling walker (2 wheels)  Transfers Sit to/from Stand;Bed to chair/wheelchair/BSC  Sit to Stand Min guard  Bed  to/from chair/wheelchair/BSC transfer type: Stand pivot  Stand pivot transfers Min assist;Min guard (min guard pivot to left, min A  pivot to right)  Balance  Overall balance assessment Needs assistance  Sitting-balance support Feet supported;No upper extremity supported  Sitting balance-Leahy Scale Fair  Standing balance support Single extremity supported;Bilateral upper extremity supported  Standing balance-Leahy Scale Poor  Standing balance comment requires at least single UE support  General Comments  General comments (skin integrity, edema, etc.) VSS on RA  OT - End of Session  Equipment Utilized During Treatment Rolling walker (2 wheels)  Activity Tolerance Patient tolerated treatment well;Patient limited by pain  Patient left in bed;with call bell/phone within reach;with bed alarm set  Nurse Communication Mobility status  OT Assessment/Plan  OT Plan Discharge plan remains appropriate;Frequency remains appropriate  OT Visit Diagnosis Unsteadiness on feet (R26.81);Muscle weakness (generalized) (M62.81);History of falling (Z91.81);Pain  Pain - Right/Left Right  Pain - part of body Leg  OT Frequency (ACUTE ONLY) Min 2X/week  Follow Up Recommendations Home health OT  Assistance recommended at discharge Intermittent Supervision/Assistance  Patient can return home with the following A little help with walking and/or transfers;Assistance with feeding;Assistance with cooking/housework;Assist for transportation;Help with stairs or ramp for entrance;A lot of help with bathing/dressing/bathroom  OT Equipment Tub/shower seat  AM-PAC OT "6 Clicks" Daily Activity Outcome Measure (Version 2)  Help from another person eating meals? 4  Help from another person taking care of personal grooming? 3  Help from another person toileting, which includes using toliet, bedpan, or urinal? 3  Help from another person bathing (including washing, rinsing, drying)? 3  Help from another person to put on and taking off regular upper body clothing? 3  Help from another person to put on and taking off regular lower body clothing? 2  6 Click  Score 18  Progressive Mobility  What is the highest level of mobility based on the progressive mobility assessment? Level 4 (Walks with assist in room) - Balance while marching in place and cannot step forward and back - Complete  Activity Transferred to/from BSC;Stood at bedside  OT Goal Progression  Progress towards OT goals Progressing toward goals (slowly, having more difficulty with LB dressing)  Acute Rehab OT Goals  Patient Stated Goal to get better  OT Goal Formulation With patient  Time For Goal Achievement 04/18/21  Potential to Achieve Goals Good  OT Time Calculation  OT Start Time (ACUTE ONLY) 0901  OT Stop Time (ACUTE ONLY) 0926  OT Time Calculation (min) 25 min  OT General Charges  $OT Visit 1 Visit  OT Treatments  $Self Care/Home Management  8-22 mins  $Therapeutic Activity 8-22 mins   Nyoka Cowden OTR/L Acute Rehabilitation Services Pager: [phone] Office: [phone]

## 2021-04-11 NOTE — Interval H&P Note (Signed)
History and Physical Interval Note:  04/11/2021 4:17 PM  [name]  has presented today for surgery, with the diagnosis of chest pain, abnormal stress test.  The various methods of treatment have been discussed with the patient and family. After consideration of risks, benefits and other options for treatment, the patient has consented to  Procedure(s): LEFT HEART CATH AND CORS/GRAFTS ANGIOGRAPHY (N/A) as a surgical intervention.  The patient's history has been reviewed, patient examined, no change in status, stable for surgery.  I have reviewed the patient's chart and labs.  Questions were answered to the patient's satisfaction.    Cath Lab Visit (complete for each Cath Lab visit)  Clinical Evaluation Leading to the Procedure:   ACS: No.  Non-ACS:    Anginal Classification: CCS class II (very limited mobility)  Anti-ischemic medical therapy: No Therapy  Non-Invasive Test Results: High-risk stress test findings: cardiac mortality >3%/year  Prior CABG: Previous CABG  Iveliz Garay

## 2021-04-11 NOTE — Progress Notes (Signed)
°  Progress Note    04/11/2021 12:15 PM 7 Days Post-Op  Subjective:  Doing well. No complaints. NPO for cardiac cath   Vitals:   04/11/21 0322 04/11/21 0840  BP: (!) 120/57   Pulse: 65   Resp: 17   Temp: 98.1 F (36.7 C) 98.1 F (36.7 C)  SpO2: 100%    Physical Exam: Lungs:  non labored Incisions:  L groin cath site without hematoma Extremities:  R foot dry wound  Neurologic: A&O Pt with drug reaction - rash present,  CBC    Component Value Date/Time   WBC 10.7 (H) 04/11/2021 0733   RBC 4.69 04/11/2021 0733   HGB 13.8 04/11/2021 0733   HCT 42.2 04/11/2021 0733   PLT 217 04/11/2021 0733   MCV 90.0 04/11/2021 0733   MCH 29.4 04/11/2021 0733   MCHC 32.7 04/11/2021 0733   RDW 11.9 04/11/2021 0733   LYMPHSABS 0.9 03/31/2021 1351   MONOABS 0.4 03/31/2021 1351   EOSABS 0.0 03/31/2021 1351   BASOSABS 0.0 03/31/2021 1351    BMET    Component Value Date/Time   NA 136 04/11/2021 0733   NA 140 07/08/2019 0932   K 3.9 04/11/2021 0733   CL 104 04/11/2021 0733   CO2 21 (L) 04/11/2021 0733   GLUCOSE 102 (H) 04/11/2021 0733   BUN 14 04/11/2021 0733   BUN 21 07/08/2019 0932   CREATININE 0.98 04/11/2021 0733   CALCIUM 8.5 (L) 04/11/2021 0733   GFRNONAA >60 04/11/2021 0733   GFRAA 89 07/08/2019 0932    INR No results found for: INR   Intake/Output Summary (Last 24 hours) at 04/11/2021 1215 Last data filed at 04/10/2021 1800 Gross per 24 hour  Intake 600 ml  Output --  Net 600 ml      Assessment/Plan:  88 y.o. male is s/p RLE arteriogram 7 Days Post-Op   Doing well this morning, no complaints Intermittent pain of the right great toe No osteomyelitis of the right great toe on MRI Cardiac cath scheduled today. The patient does not have usable vein, and therefore PTFE will be used as the conduit. Being that he does not have osteomyelitis, will discuss plans with podiatry prior to OR He is aware this is a high risk surgery, and that plastic conduit has a higher  failure rate.  Unfortunately, his only options are amputation or bypass. Plan for femoral to AT bypass tomorrow. Will likely be with my partner Dr. Servando Snare.   Billy Santee, MD [facility] Phone Number: [phone] 04/11/2021 12:15 PM

## 2021-04-11 NOTE — Progress Notes (Signed)
Podiatry Progress note  HPI: [name] is a 88 y.o. male patient who is well-known to podiatry service and has been closely followed for eschar to right great toe with cellulitis.  Patient has had imaging which does not show any acute concern for osteomyelitis.  Patient has been closely followed by vascular as well for critical limb ischemia with plan for surgery today however per chart review procedure has been rescheduled to tomorrow for bypass.  Patient was met this afternoon bedside and complains of continued pain in both legs.  Patient denies any other pedal complaints at this time.  Patient Active Problem List   Diagnosis Date Noted   Abnormal nuclear cardiac imaging test    Rash 04/09/2021   Critical limb ischemia of both lower extremities (HCC)    Paroxysmal atrial flutter ([place]) 04/01/2021   Urinary hesitancy 04/01/2021   Cellulitis and abscess of foot 69/62/9528   Chronic systolic CHF (congestive heart failure) ([place]) 03/31/2021   Macular degeneration 03/31/2021   PAD (peripheral artery disease) ([place]) 03/31/2021   Cellulitis of left upper arm 01/18/2021   Ulcer of right foot, limited to breakdown of skin ([place]) 05/20/2020   Benign paroxysmal vertigo of both ears 07/28/2019   Impacted cerumen, bilateral 04/24/2018   PAF (paroxysmal atrial fibrillation) ([place]) 12/31/2017   Thrombocytopenia due to drugs 07/16/2017   Partial small bowel obstruction ([place]) 03/05/2017   Recurrent incisional hernia 03/05/2017   History of small bowel obstruction 03/05/2017   Dizziness 10/05/2016   Abnormal nuclear stress test 09/15/2016   History of cardiac radiofrequency ablation 09/15/2016   Hx of CABG 09/15/2016   Old MI (myocardial infarction) 09/15/2016   Aortic atherosclerosis ([place]) 02/16/2016   Anticoagulant long-term use 05/05/2015   BMI 31.0-31.9,adult 05/05/2015   Chronic rhinitis 05/05/2015   CAD (coronary artery disease) with history of CABG 05/05/2015   Elevated PSA 05/05/2015    GERD (gastroesophageal reflux disease) 05/05/2015   High risk medication use 05/05/2015   Lumbar degenerative disc disease 05/05/2015   Malaise and fatigue 05/05/2015   Prediabetes 05/05/2015   Primary osteoarthritis involving multiple joints 05/05/2015   Typical atrial flutter ([place]) 05/05/2015   Ventral hernia without obstruction or gangrene 05/05/2015   Sick sinus syndrome ([place]) status post pacemaker 02/17/2015   Hyperlipidemia 02/14/2015   Preoperative cardiovascular examination 02/14/2015   Presence of cardiac pacemaker 02/14/2015   RBBB with left anterior fascicular block 02/14/2015    No current facility-administered medications on file prior to encounter.   Current Outpatient Medications on File Prior to Encounter  Medication Sig Dispense Refill   acetaminophen (TYLENOL) 500 MG tablet Take 500 mg by mouth every 6 (six) hours as needed for mild pain.     hydroxypropyl methylcellulose / hypromellose (ISOPTO TEARS / GONIOVISC) 2.5 % ophthalmic solution Place 1 drop into both eyes daily as needed for dry eyes.     ibuprofen (ADVIL) 200 MG tablet Take 200 mg by mouth every 6 (six) hours as needed for mild pain.     Nutritional Supplements (PROSTATE PO) Take 1 tablet by mouth daily.      Allergies  Allergen Reactions   Amiodarone Nausea And Vomiting    vomiting    Gabapentin     dizziness     Metoprolol Other (See Comments)    Drop in heart rate, dizziness.     Statins     myalgias   Doxycycline     Unknown to patient    Neomycin     Unknown  to patient     Niacin     Unknown to patient    Ramipril     Unknown to patient     Rocephin [Ceftriaxone] Rash    Whole body rash    Past Surgical History:  Procedure Laterality Date   ABDOMINAL AORTOGRAM W/LOWER EXTREMITY Bilateral 04/04/2021   Procedure: ABDOMINAL AORTOGRAM W/LOWER EXTREMITY;  Surgeon: Waynetta Sandy, MD;  Location: [place] [facility];  Service: Cardiovascular;  Laterality: Bilateral;    ATRIAL FLUTTER ABLATION     COLON SURGERY     CORONARY ARTERY BYPASS GRAFT     INSERT / REPLACE / REMOVE PACEMAKER      Family History  Problem Relation Age of Onset   Diabetes Sister    Hyperlipidemia Daughter    Hypertension Daughter    Diabetes Daughter    Rheum arthritis Daughter    Hypertension Daughter    AAA (abdominal aortic aneurysm) Daughter    Migraines Daughter    Heart attack Neg Hx    Heart disease Neg Hx    Heart failure Neg Hx     Social History   Socioeconomic History   Marital status: Divorced    Spouse name: Not on file   Number of children: Not on file   Years of education: Not on file   Highest education level: Not on file  Occupational History   Not on file  Tobacco Use   Smoking status: Never   Smokeless tobacco: Never  Vaping Use   Vaping Use: Never used  Substance and Sexual Activity   Alcohol use: Never   Drug use: Never   Sexual activity: Not on file  Other Topics Concern   Not on file  Social History Narrative   Not on file   Social Determinants of Health   Financial Resource Strain: Not on file  Food Insecurity: Not on file  Transportation Needs: Not on file  Physical Activity: Not on file  Stress: Not on file  Social Connections: Not on file  Intimate Partner Violence: Not on file     Objective:  Today's Vitals   04/10/21 2140 04/11/21 0322 04/11/21 0500 04/11/21 0840  BP:  (!) 120/57    Pulse:  65    Resp:  17    Temp:  98.1 F (36.7 C)  98.1 F (36.7 C)  TempSrc:  Oral  Oral  SpO2:  100%    Weight:   87 kg   Height:      PainSc: 4       Body mass index is 27.52 kg/m.   General: Alert and oriented x3 in no acute distress  Dermatology: Dry eschar noted to the right hallux distal tuft there is also a dry scab noted at the right anterior shin there is blanchable erythema to the entire lower extremity right and left to the level of the mid shins without warmth this erythema increases with dependency likely  vascular related  Vascular: Dorsalis Pedis and Posterior Tibial pedal pulses nonpalpable, capillary fill time delayed, temperature gradient slightly decreased.  Neurology: Patient is hypersensitive to light touch on the right foot.  Musculoskeletal: Moderate tenderness to palpation even with light touch to the right foot.  Results CT right foot IMPRESSION: 1. Substantial medial ulceration of the great toe with enhancement in the surrounding subcutaneous tissues indicating cellulitis. No bony destructive findings to indicate osteomyelitis. No drainable abscess identified. 2. Dorsal subcutaneous edema along the forefoot, cellulitis is not excluded. 3. Degenerative findings  along the first MTP joint. CT right ankle IMPRESSION: 1. No findings of osteomyelitis or septic joint involving the ankle. 2. Notable degenerative findings in the hindfoot and midfoot. 3. Reduced size and indistinctness of the tibialis posterior tendon which could reflect partial tearing or tendinopathy. 4. Mild tibiotalar valgus angulation. 5. Posterior spurring from the distal tibia may be somewhat impinging on the flexor digitorum longus tendon. 6. Circumferential infiltrative subcutaneous edema in the ankle.     Results for orders placed or performed during the hospital encounter of 03/31/21  Resp Panel by RT-PCR (Flu A&B, Covid) Nasopharyngeal Swab     Status: None   Collection Time: 03/31/21  1:46 PM   Specimen: Nasopharyngeal Swab; Nasopharyngeal(NP) swabs in vial transport medium  Result Value Ref Range Status   SARS Coronavirus 2 by RT PCR NEGATIVE NEGATIVE Final    Comment: (NOTE) SARS-CoV-2 target nucleic acids are NOT DETECTED.  The SARS-CoV-2 RNA is generally detectable in upper respiratory specimens during the acute phase of infection. The lowest concentration of SARS-CoV-2 viral copies this assay can detect is 138 copies/mL. A negative result does not preclude SARS-Cov-2 infection and should  not be used as the sole basis for treatment or other patient management decisions. A negative result may occur with  improper specimen collection/handling, submission of specimen other than nasopharyngeal swab, presence of viral mutation(s) within the areas targeted by this assay, and inadequate number of viral copies(<138 copies/mL). A negative result must be combined with clinical observations, patient history, and epidemiological information. The expected result is Negative.  Fact Sheet for Patients:  EntrepreneurPulse.com.au  Fact Sheet for Healthcare Providers:  IncredibleEmployment.be  This test is no t yet approved or cleared by the [place] FDA and  has been authorized for detection and/or diagnosis of SARS-CoV-2 by FDA under an Emergency Use Authorization (EUA). This EUA will remain  in effect (meaning this test can be used) for the duration of the COVID-19 declaration under Section 564(b)(1) of the Act, 21 U.S.C.section 360bbb-3(b)(1), unless the authorization is terminated  or revoked sooner.       Influenza A by PCR NEGATIVE NEGATIVE Final   Influenza B by PCR NEGATIVE NEGATIVE Final    Comment: (NOTE) The Xpert Xpress SARS-CoV-2/FLU/RSV plus assay is intended as an aid in the diagnosis of influenza from Nasopharyngeal swab specimens and should not be used as a sole basis for treatment. Nasal washings and aspirates are unacceptable for Xpert Xpress SARS-CoV-2/FLU/RSV testing.  Fact Sheet for Patients: EntrepreneurPulse.com.au  Fact Sheet for Healthcare Providers: IncredibleEmployment.be  This test is not yet approved or cleared by the [place] FDA and has been authorized for detection and/or diagnosis of SARS-CoV-2 by FDA under an Emergency Use Authorization (EUA). This EUA will remain in effect (meaning this test can be used) for the duration of the COVID-19 declaration under  Section 564(b)(1) of the Act, 21 U.S.C. section 360bbb-3(b)(1), unless the authorization is terminated or revoked.  Performed at [facility], [place] [address], [place], [place] [address]   Culture, blood (routine x 2)     Status: None   Collection Time: 03/31/21  3:43 PM   Specimen: BLOOD LEFT FOREARM  Result Value Ref Range Status   Specimen Description BLOOD LEFT FOREARM  Final   Special Requests   Final    BOTTLES DRAWN AEROBIC AND ANAEROBIC Blood Culture results may not be optimal due to an inadequate volume of blood received in culture bottles   Culture   Final  NO GROWTH 5 DAYS Performed at [facility], [place] [address], [place], [place] [address]    Report Status 04/05/2021 FINAL  Final  MRSA Next Gen by PCR, Nasal     Status: None   Collection Time: 04/10/21  1:00 PM   Specimen: Nasal Mucosa; Nasal Swab  Result Value Ref Range Status   MRSA by PCR Next Gen NOT DETECTED NOT DETECTED Final    Comment: (NOTE) The GeneXpert MRSA Assay (FDA approved for NASAL specimens only), is one component of a comprehensive MRSA colonization surveillance program. It is not intended to diagnose MRSA infection nor to guide or monitor treatment for MRSA infections. Test performance is not FDA approved in patients less than 70 years old. Performed at [facility], [place] [address] [place],  [address]      Assessment and Plan: Problem List Items Addressed This Visit   None Visit Diagnoses     Foot pain, right    -  Primary   Cellulitis of right lower extremity            -Complete examination performed -Previous imaging reviewed -There are no acute surgical indications at this time for the eschar at the right great toe -Recommend continue with Betadine to the toe and a loose sock -Recommend continue with vascular optimization and procedures as planned in hopes to try to help with wound healing at the right great toe.  Patient in the future may benefit from  topical medication/enzymatic debridement with use of Santyl however would not recommend this at this moment until after vascular intervention has been performed -Continue with antibiotic management per medical team/infectious disease for any superimposed concern for cellulitis -Continue with surgical shoe and protection of the right great toe -Patient's right great toe is clinically stable and no surgical interventions are planned.  Podiatry to follow as needed and as scheduled once he is discharged.  Dr. Landis Martins, [place] [facility] [place] [phone] office [phone] cell

## 2021-04-11 NOTE — Progress Notes (Signed)
PROGRESS NOTE    [name]  [id number] DOB: 04-Mar-1934 DOA: 03/31/2021 PCP: Raina Mina., MD   Brief Narrative:  88 y.o. male with medical history significant of coronary artery disease, ischemic cardiomyopathy with chronic systolic CHF with prior EF of 25%, history of CABG in 2006, a flutter status post ablation in 2014, VT, also sick sinus syndrome status post pacemaker.  He has been dealing with a right lower extremity callus for several weeks, seen as an outpatient by podiatry.  Over the last 3 to 4 days, he has noticed increased pain to the right lower extremity, increased swelling and redness.  He has a degree of neuropathy and does not feel much.  He was seen in podiatrist office and was sent to the hospital.  Vascular surgery as well as podiatry consulted, arteriogram shows significant right lower extremity arterial disease now being considered for bypass surgery  Assessment & Plan:   Principal Problem:   Cellulitis and abscess of foot Active Problems:   Abnormal nuclear stress test   CAD (coronary artery disease) with history of CABG   Hx of CABG   Hyperlipidemia   Dizziness   Presence of cardiac pacemaker   Sick sinus syndrome (HCC) status post pacemaker   Chronic systolic CHF (congestive heart failure) (HCC)   Macular degeneration   PAD (peripheral artery disease) (HCC)   Paroxysmal atrial flutter (HCC)   Urinary hesitancy   Critical limb ischemia of both lower extremities (HCC)   Rash   Abnormal nuclear cardiac imaging test  Cellulitis and abscess of foot- (present on admission) - Patient admitted with right foot cellulitis and abscess.  - Could not get an MRI due to pacemaker.  -initially: pus draining from right greater toe/Cellulitis extends up to mid upper shins.  -antibiotics-- IV vanc/zosyn initially, narrowed to rocephin but patient developed an itching rash (unclear if related to new abx but timing coincides)- changed back to vanc/zosyn (also redness  on IV rocephin seems to have worsened).  Due to no improvement in the rash, antibiotics were transitioned from zosyn/vanc to flagyl/levo. Would consider adding linezolid if clinical worsening for mrsa coverage -Blood cultures: no growth to date. Wound culture skin flora -Vascular surgery consulted, underwent arteriogram 1/23, and he is to be considered for right femoral to anterior tibial artery bypass.  Pending cardiac evaluation prior to surgery- now plans for LHC today. -Podiatry consulted as well:.  Bone scan does not show osteomyelitis   Drug/morbilliform rash -unclear cause-- suspect abx as timing seems to co-incide, doubt pain medication. Patient thinks related to bandage on his lower back -Still complains of itching, still no change in appearance of the morbilliform rash but it does appear to be very typical of drug reaction.  benadryl/H2/H1 blocker - abx changed as above   Critical limb ischemia of both lower extremities/PAD [facility] Per vascular: rescheduled his surgery for Monday, 1/30 (as LHC planned for this date suspect this will need to be moved).   The patient does not have usable vein, and therefore PTFE will be used as the conduit.  Management per vascular surgery.   Urinary hesitancy - Possibly due to BPH.  This is been going on for quite some time, but most recently has noticed that he needs to urinate more often during the nighttime.  He is supposed to see urology outpatient - started on Flomax while here, no further issues, urinating well   Paroxysmal atrial flutter ([place]) - history of ablation, now has pacemaker.  Apparently  he was on Coumadin in the past, and per care everywhere notes, in 2018, his Coumadin was stopped as he had no recurrence for A. fib or flutter for 1.5 years at that time.   Chronic systolic CHF (congestive heart failure) ([place]) -Last seen cardiology at [facility], Dr. Ola Spurr, in 2018.  It was mentioned that the EF at that time was 40 to 45%, and recently had  a stress test which was negative.  2D echo done here shows EF 40-45% with mildly decreased LV function.  Suspect unchanged.  He has no heart failure symptoms, no angina.  Currently he is not on goal-directed therapy, not clear when he stopped the medications but back in 2018 he was on metoprolol 12.5 twice daily as well as aspirin.  Discussed with the daughter, he has self discontinued the medication as he just does not like to take pills.  Currently appears stable.  Vascular surgery request cardiology evaluation for restratification pre-bypass surgery.  -LHC for monday   Sick sinus syndrome ([place]) status post pacemaker- (present on admission) - Keep on medical telemetry.  He is paced   Dizziness -Chronic    Hyperlipidemia- (present on admission) - Has not been taking statin at home.  LDL at 30  CAD with history of CABG (coronary artery disease) with history of CABG -noted, no chest pain or apparent active issues -cath today.   Essential hypertension-resolved as of 04/06/2021 -Monitor blood pressure, currently not taking any medications, has remained within acceptable parameters   DVT prophylaxis: enoxaparin (LOVENOX) injection 40 mg Start: 03/31/21 2000   Code Status: Full Code  Family Communication:  None present at bedside.  Plan of care discussed with patient in length and he/she verbalized understanding and agreed with it.  Status is: Inpatient  Remains inpatient appropriate because: Needs cardiac cath and revascularization for PAD.  Estimated body mass index is 27.52 kg/m as calculated from the following:   Height as of this encounter: 5\' 10"  (1.778 m).   Weight as of this encounter: 87 kg.    Nutritional Assessment: Body mass index is 27.52 kg/m.Marland Kitchen Seen by dietician.  I agree with the assessment and plan as outlined below: Nutrition Status:        . Skin Assessment: I have examined the patient's skin and I agree with the wound assessment as performed by the wound care  RN as outlined below:    Consultants:  Cardiology Vascular surgery  Procedures:  As above  Antimicrobials:  Anti-infectives (From admission, onward)    Start     Dose/Rate Route Frequency Ordered Stop   04/10/21 1400  levofloxacin (LEVAQUIN) IVPB 500 mg        500 mg 100 mL/hr over 60 Minutes Intravenous Every 24 hours 04/10/21 1313     04/10/21 1345  metroNIDAZOLE (FLAGYL) IVPB 500 mg        500 mg 100 mL/hr over 60 Minutes Intravenous Every 12 hours 04/10/21 1246     04/10/21 0000  vancomycin (VANCOREADY) IVPB 1250 mg/250 mL  Status:  Discontinued        1,250 mg 166.7 mL/hr over 90 Minutes Intravenous Every 24 hours 04/09/21 0840 04/10/21 1246   04/09/21 1000  piperacillin-tazobactam (ZOSYN) IVPB 3.375 g  Status:  Discontinued        3.375 g 12.5 mL/hr over 240 Minutes Intravenous Every 8 hours 04/09/21 0840 04/10/21 1246   04/09/21 0930  vancomycin (VANCOREADY) IVPB 1250 mg/250 mL        1,250 mg 166.7  mL/hr over 90 Minutes Intravenous  Once 04/09/21 0810 04/09/21 1109   04/07/21 1400  cefTRIAXone (ROCEPHIN) 2 g in sodium chloride 0.9 % 100 mL IVPB  Status:  Discontinued        2 g 200 mL/hr over 30 Minutes Intravenous Every 24 hours 04/07/21 1035 04/09/21 0809   04/03/21 1800  vancomycin (VANCOREADY) IVPB 1250 mg/250 mL  Status:  Discontinued        1,250 mg 166.7 mL/hr over 90 Minutes Intravenous Every 24 hours 04/03/21 1455 04/07/21 1035   04/01/21 1830  vancomycin (VANCOREADY) IVPB 1750 mg/350 mL  Status:  Discontinued        1,750 mg 175 mL/hr over 120 Minutes Intravenous Every 24 hours 03/31/21 1632 04/03/21 1455   04/01/21 0630  clindamycin (CLEOCIN) IVPB 600 mg  Status:  Discontinued        600 mg 100 mL/hr over 30 Minutes Intravenous Every 8 hours 04/01/21 0540 04/04/21 1119   04/01/21 0000  piperacillin-tazobactam (ZOSYN) IVPB 3.375 g  Status:  Discontinued        3.375 g 12.5 mL/hr over 240 Minutes Intravenous Every 8 hours 03/31/21 1708 04/07/21 1035    03/31/21 2200  clindamycin (CLEOCIN) IVPB 600 mg  Status:  Discontinued        600 mg 100 mL/hr over 30 Minutes Intravenous Every 8 hours 03/31/21 1935 04/01/21 0540   03/31/21 1945  vancomycin (VANCOCIN) IVPB 1000 mg/200 mL premix  Status:  Discontinued        1,000 mg 200 mL/hr over 60 Minutes Intravenous  Once 03/31/21 1935 03/31/21 1944   03/31/21 1945  piperacillin-tazobactam (ZOSYN) IVPB 3.375 g  Status:  Discontinued        3.375 g 100 mL/hr over 30 Minutes Intravenous  Once 03/31/21 1935 03/31/21 1944   03/31/21 1630  vancomycin (VANCOREADY) IVPB 2000 mg/400 mL        2,000 mg 200 mL/hr over 120 Minutes Intravenous  Once 03/31/21 1619 03/31/21 1935   03/31/21 1600  vancomycin (VANCOREADY) IVPB 1750 mg/350 mL  Status:  Discontinued        1,750 mg 175 mL/hr over 120 Minutes Intravenous  Once 03/31/21 1541 03/31/21 1619   03/31/21 1545  piperacillin-tazobactam (ZOSYN) IVPB 3.375 g        3.375 g 100 mL/hr over 30 Minutes Intravenous  Once 03/31/21 1537 03/31/21 1633         Subjective: Seen and examined.  He complains of itching and rash all over the body.  No other complaint.  Objective: Vitals:   04/10/21 1948 04/11/21 0322 04/11/21 0500 04/11/21 0840  BP: (!) 110/56 (!) 120/57    Pulse: 67 65    Resp: 14 17    Temp: 98.2 F (36.8 C) 98.1 F (36.7 C)  98.1 F (36.7 C)  TempSrc: Oral Oral  Oral  SpO2: 98% 100%    Weight:   87 kg   Height:        Intake/Output Summary (Last 24 hours) at 04/11/2021 1046 Last data filed at 04/10/2021 1800 Gross per 24 hour  Intake 600 ml  Output --  Net 600 ml   Filed Weights   04/09/21 0413 04/10/21 0500 04/11/21 0500  Weight: 86.3 kg 86.9 kg 87 kg    Examination:  General exam: Appears calm and comfortable  Respiratory system: Clear to auscultation. Respiratory effort normal. Cardiovascular system: S1 & S2 heard, RRR. No JVD, murmurs, rubs, gallops or clicks. No pedal edema. Gastrointestinal system: Abdomen  is  nondistended, soft and nontender. No organomegaly or masses felt. Normal bowel sounds heard. Central nervous system: Alert and oriented. No focal neurological deficits. Extremities: Symmetric 5 x 5 power. Skin: Morbilliform rash all over the body sparing hands and face and feet. Psychiatry: Judgement and insight appear normal. Mood & affect appropriate.    Data Reviewed: I have personally reviewed following labs and imaging studies  CBC: Recent Labs  Lab 04/05/21 0401 04/06/21 0422 04/08/21 0419 04/10/21 0245 04/11/21 0733  WBC 7.7 8.0 7.1 12.5* 10.7*  HGB 14.0 12.9* 13.5 14.1 13.8  HCT 41.5 38.9* 39.6 43.9 42.2  MCV 89.4 89.8 89.4 91.8 90.0  PLT 168 174 184 210 [id number]   Basic Metabolic Panel: Recent Labs  Lab 04/05/21 0401 04/06/21 0422 04/08/21 0419 04/10/21 0245 04/11/21 0733  NA 136 136 137 136 136  K 3.6 3.7 3.4* 3.9 3.9  CL 106 108 107 107 104  CO2 21* 21* 22 20* 21*  GLUCOSE 96 103* 98 99 102*  BUN 13 10 10 13 14   CREATININE 0.88 0.86 0.81 0.94 0.98  CALCIUM 8.1* 8.4* 8.4* 8.4* 8.5*   GFR: Estimated Creatinine Clearance: 56.9 mL/min (by C-G formula based on SCr of 0.98 mg/dL). Liver Function Tests: No results for input(s): AST, ALT, ALKPHOS, BILITOT, PROT, ALBUMIN in the last 168 hours. No results for input(s): LIPASE, AMYLASE in the last 168 hours. No results for input(s): AMMONIA in the last 168 hours. Coagulation Profile: No results for input(s): INR, PROTIME in the last 168 hours. Cardiac Enzymes: No results for input(s): CKTOTAL, CKMB, CKMBINDEX, TROPONINI in the last 168 hours. BNP (last 3 results) No results for input(s): PROBNP in the last 8760 hours. HbA1C: No results for input(s): HGBA1C in the last 72 hours. CBG: No results for input(s): GLUCAP in the last 168 hours. Lipid Profile: No results for input(s): CHOL, HDL, LDLCALC, TRIG, CHOLHDL, LDLDIRECT in the last 72 hours. Thyroid Function Tests: No results for input(s): TSH, T4TOTAL, FREET4,  T3FREE, THYROIDAB in the last 72 hours. Anemia Panel: No results for input(s): VITAMINB12, FOLATE, FERRITIN, TIBC, IRON, RETICCTPCT in the last 72 hours. Sepsis Labs: No results for input(s): PROCALCITON, LATICACIDVEN in the last 168 hours.  Recent Results (from the past 240 hour(s))  MRSA Next Gen by PCR, Nasal     Status: None   Collection Time: 04/10/21  1:00 PM   Specimen: Nasal Mucosa; Nasal Swab  Result Value Ref Range Status   MRSA by PCR Next Gen NOT DETECTED NOT DETECTED Final    Comment: (NOTE) The GeneXpert MRSA Assay (FDA approved for NASAL specimens only), is one component of a comprehensive MRSA colonization surveillance program. It is not intended to diagnose MRSA infection nor to guide or monitor treatment for MRSA infections. Test performance is not FDA approved in patients less than 26 years old. Performed at [facility], [place] [address] [place], [place] [address]      Radiology Studies: No results found.  Scheduled Meds:  aspirin EC  81 mg Oral Daily   enoxaparin (LOVENOX) injection  40 mg Subcutaneous Q24H   famotidine  20 mg Oral Daily   loratadine  10 mg Oral Daily   melatonin  5 mg Oral QHS   pantoprazole  40 mg Oral Daily   sodium chloride flush  3 mL Intravenous Q12H   sodium chloride flush  3 mL Intravenous Q12H   tamsulosin  0.4 mg Oral QPC supper   Continuous Infusions:  sodium chloride  sodium chloride     sodium chloride 10 mL/hr at 04/11/21 0621   levofloxacin (LEVAQUIN) IV Stopped (04/10/21 1747)   metronidazole 500 mg (04/11/21 0157)     LOS: 10 days   Time spent: 31 minutes  Darliss Cheney, MD [facility]  04/11/2021, 10:46 AM  Please page via [place] [place] and do not message via secure chat for anything urgent. Secure chat can be used for anything non urgent.  How to contact the [facility] Attending or Consulting provider [place] or covering provider during after hours [place], for this patient?  Check the care team in [facility] and  look for a) attending/consulting [facility] provider listed and b) the [facility] team listed. Page or secure chat [phone]. Log into [email or web address] and use [place]'s universal password to access. If you do not have the password, please contact the hospital operator. Locate the [facility] provider you are looking for under [facility] and page to a number that you can be directly reached. If you still have difficulty reaching the provider, please page the [facility] (Director on Call) for the Hospitalists listed on amion for assistance.

## 2021-04-11 NOTE — Progress Notes (Signed)
Progress Note  Patient Name: [name] Date of Encounter: 04/11/2021  [facility] Cardiologist: Shirlee More, MD   Subjective   Denies any chest pain or dyspnea   Inpatient Medications    Scheduled Meds:  aspirin EC  81 mg Oral Daily   enoxaparin (LOVENOX) injection  40 mg Subcutaneous Q24H   famotidine  20 mg Oral Daily   loratadine  10 mg Oral Daily   melatonin  5 mg Oral QHS   pantoprazole  40 mg Oral Daily   sodium chloride flush  3 mL Intravenous Q12H   sodium chloride flush  3 mL Intravenous Q12H   tamsulosin  0.4 mg Oral QPC supper   Continuous Infusions:  sodium chloride     sodium chloride     sodium chloride 10 mL/hr at 04/11/21 [id number]   levofloxacin (LEVAQUIN) IV Stopped (04/10/21 1747)   metronidazole 500 mg (04/11/21 0157)   PRN Meds: sodium chloride, sodium chloride, acetaminophen, alum & mag hydroxide-simeth, bisacodyl, camphor-menthol, diphenhydrAMINE, hydrALAZINE, ondansetron (ZOFRAN) IV, oxyCODONE, sodium chloride flush, sodium chloride flush   Vital Signs    Vitals:   04/10/21 1434 04/10/21 1948 04/11/21 0322 04/11/21 0500  BP: (!) 145/67 (!) 110/56 (!) 120/57   Pulse: 83 67 65   Resp: 15 14 17    Temp: 98.1 F (36.7 C) 98.2 F (36.8 C) 98.1 F (36.7 C)   TempSrc: Oral Oral Oral   SpO2: 100% 98% 100%   Weight:    87 kg  Height:        Intake/Output Summary (Last 24 hours) at 04/11/2021 0808 Last data filed at 04/10/2021 1800 Gross per 24 hour  Intake 600 ml  Output 550 ml  Net 50 ml   Last 3 Weights 04/11/2021 04/10/2021 04/09/2021  Weight (lbs) 191 lb 12.8 oz 191 lb 9.3 oz 190 lb 4.1 oz  Weight (kg) 87 kg 86.9 kg 86.3 kg      Telemetry    Atrial paced, ventricular sensed rhythm- Personally Reviewed  ECG    Atrial paced, ventricular sensed rhythm, RBBB plus LAFB, Q waves V1-V4- Personally Reviewed  Physical Exam  Overweight.  Generalized papular erythematous rash GEN: No acute distress.   Neck: No JVD Cardiac: RRR, no  murmurs Respiratory: Clear to auscultation bilaterally. GI: Soft, nontender, non-distended  MS: No edema; No deformity. Neuro:  Nonfocal  Psych: Normal affect   Labs    High Sensitivity Troponin:  No results for input(s): TROPONINIHS in the last 720 hours.   Chemistry Recent Labs  Lab 04/06/21 0422 04/08/21 0419 04/10/21 0245  NA 136 137 136  K 3.7 3.4* 3.9  CL 108 107 107  CO2 21* 22 20*  GLUCOSE 103* 98 99  BUN 10 10 13   CREATININE 0.86 0.81 0.94  CALCIUM 8.4* 8.4* 8.4*  GFRNONAA >60 >60 >60  ANIONGAP 7 8 9     Lipids  Recent Labs  Lab 04/05/21 0401  CHOL 111  TRIG 44  HDL 31*  LDLCALC 71  CHOLHDL 3.6    Hematology Recent Labs  Lab 04/06/21 0422 04/08/21 0419 04/10/21 0245  WBC 8.0 7.1 12.5*  RBC 4.33 4.43 4.78  HGB 12.9* 13.5 14.1  HCT 38.9* 39.6 43.9  MCV 89.8 89.4 91.8  MCH 29.8 30.5 29.5  MCHC 33.2 34.1 32.1  RDW 11.6 11.4* 11.7  PLT 174 184 210   Thyroid No results for input(s): TSH, FREET4 in the last 168 hours.  BNPNo results for input(s): BNP, PROBNP in the  last 168 hours.  °DDimer No results for input(s): DDIMER in the last 168 hours.  ° °Radiology  °  °No results found. ° °Cardiac Studies  ° °2D echo 04/01/21 ° ° 1. Global mild to moderate hypokinesis with akinesis of the anterior,  °anteroseptal wall and apex c/w LAD disease. Left ventricular ejection  °fraction, by estimation, is 40 to 45%. The left ventricle has mildly  °decreased function. The left ventricle  °demonstrates regional wall motion abnormalities (see scoring  °diagram/findings for description). There is mild concentric left  °ventricular hypertrophy. Left ventricular diastolic parameters are  °indeterminate.  ° 2. Right ventricular systolic function was not well visualized. The right  °ventricular size is not well visualized.  ° 3. Left atrial size was moderately dilated.  ° 4. Right atrium is dilated.  ° 5. The mitral valve is grossly normal. No evidence of mitral valve  °regurgitation.   ° 6. The aortic valve is grossly normal. Aortic valve regurgitation is  °mild. Aortic valve sclerosis/calcification is present, without any  °evidence of aortic stenosis.  ° 7. The inferior vena cava not well visualized.   °  °Lexiscan Myoview 04/07/21:  °IMPRESSION: °1. 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 Mild °peri-infarct ischemia is noted anteriorly. °  °2. Moderate global hypomotility is noted. °  °3. Left ventricular ejection fraction 34% °  °4. Non invasive risk stratification*: High °  °Abdominal aortogram 04/04/21: °Findings: Patient has tortuous external iliac arteries.  The common iliac and aorta are calcified.  Renal arteries without any stenoses.  Right SFA is flush occluded there is a large profunda giving many collaterals.  There is an island of distal SFA.  He then reconstitutes intertibial artery which flows to the foot.  Posterior tibial artery appears to reconstitute but it occludes above the ankle.  On the left side his common femoral artery is calcified without any flow-limiting stenosis.  SFA is heavily calcified but patent down to the popliteal.  Trifurcation is patent.  All tibial vessels are heavily diseased with posterior tibial being the dominant runoff. ° °Patient will be considered for right femoral to anterior tibial artery bypass and vein mapping will be ordered. ° °Patient Profile  °   °87 y.o. 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  Due to severity of PAD plan for right SFA to anterior tibial bypass  surgery, with a request for preoperative cardiac clearance. ° °Assessment & Plan  °  ° °Pre-operative risk assessment: h/o CAD s/p CABG/PCI (2006). He was admitted for cellulitis and abscess.  He had ABIs that were abnormal and concerning for critical limb ischemia.  He had a PV angio that showed an occluded right SFA with many collaterals.  It reconstitutes in the intertibial artery and the tibial artery.  The tibial artery is occluded above the ankle.  He also has left common femoral artery and heavily calcified left SFA that was patent to the level of the popliteal.  He was recommended that he have a right femoral to anterior tibial bypass.  Given that his legs prohibit him from getting much exercise a Lexiscan Myoview was done which showed Large fixed   defects are seen involving the anterior, apical and inferior myocardium consistent with old infarctions. Mild peri-infarct ischemia is noted anteriorly.  High risk study.  Echo with EF 40-45%. -LHC planned for today.  Ideally, plan medical management of CAD since he requires peripheral revascularization for critical limb ischemia.  Hyperlipidemia:   He declines statins as he reports having myalgias in the past.  This admission his total cholesterol is 111 and his LDL is 71.  Recommend that he be seen in lipid clinic as an outpatient.  Would recommend an LDL goal less than 55 given his multivessel disease.  Continue aspirin.         For questions or updates, please contact [place] Please consult [email or web address] for contact info under        Signed, Donato Heinz, MD  04/11/2021, 8:08 AM

## 2021-04-11 NOTE — H&P (View-Only) (Signed)
Progress Note  Patient Name: [name] Date of Encounter: 04/11/2021  [facility] Cardiologist: Shirlee More, MD   Subjective   Denies any chest pain or dyspnea   Inpatient Medications    Scheduled Meds:  aspirin EC  81 mg Oral Daily   enoxaparin (LOVENOX) injection  40 mg Subcutaneous Q24H   famotidine  20 mg Oral Daily   loratadine  10 mg Oral Daily   melatonin  5 mg Oral QHS   pantoprazole  40 mg Oral Daily   sodium chloride flush  3 mL Intravenous Q12H   sodium chloride flush  3 mL Intravenous Q12H   tamsulosin  0.4 mg Oral QPC supper   Continuous Infusions:  sodium chloride     sodium chloride     sodium chloride 10 mL/hr at 04/11/21 [id number]   levofloxacin (LEVAQUIN) IV Stopped (04/10/21 1747)   metronidazole 500 mg (04/11/21 0157)   PRN Meds: sodium chloride, sodium chloride, acetaminophen, alum & mag hydroxide-simeth, bisacodyl, camphor-menthol, diphenhydrAMINE, hydrALAZINE, ondansetron (ZOFRAN) IV, oxyCODONE, sodium chloride flush, sodium chloride flush   Vital Signs    Vitals:   04/10/21 1434 04/10/21 1948 04/11/21 0322 04/11/21 0500  BP: (!) 145/67 (!) 110/56 (!) 120/57   Pulse: 83 67 65   Resp: 15 14 17    Temp: 98.1 F (36.7 C) 98.2 F (36.8 C) 98.1 F (36.7 C)   TempSrc: Oral Oral Oral   SpO2: 100% 98% 100%   Weight:    87 kg  Height:        Intake/Output Summary (Last 24 hours) at 04/11/2021 0808 Last data filed at 04/10/2021 1800 Gross per 24 hour  Intake 600 ml  Output 550 ml  Net 50 ml   Last 3 Weights 04/11/2021 04/10/2021 04/09/2021  Weight (lbs) 191 lb 12.8 oz 191 lb 9.3 oz 190 lb 4.1 oz  Weight (kg) 87 kg 86.9 kg 86.3 kg      Telemetry    Atrial paced, ventricular sensed rhythm- Personally Reviewed  ECG    Atrial paced, ventricular sensed rhythm, RBBB plus LAFB, Q waves V1-V4- Personally Reviewed  Physical Exam  Overweight.  Generalized papular erythematous rash GEN: No acute distress.   Neck: No JVD Cardiac: RRR, no  murmurs Respiratory: Clear to auscultation bilaterally. GI: Soft, nontender, non-distended  MS: No edema; No deformity. Neuro:  Nonfocal  Psych: Normal affect   Labs    High Sensitivity Troponin:  No results for input(s): TROPONINIHS in the last 720 hours.   Chemistry Recent Labs  Lab 04/06/21 0422 04/08/21 0419 04/10/21 0245  NA 136 137 136  K 3.7 3.4* 3.9  CL 108 107 107  CO2 21* 22 20*  GLUCOSE 103* 98 99  BUN 10 10 13   CREATININE 0.86 0.81 0.94  CALCIUM 8.4* 8.4* 8.4*  GFRNONAA >60 >60 >60  ANIONGAP 7 8 9     Lipids  Recent Labs  Lab 04/05/21 0401  CHOL 111  TRIG 44  HDL 31*  LDLCALC 71  CHOLHDL 3.6    Hematology Recent Labs  Lab 04/06/21 0422 04/08/21 0419 04/10/21 0245  WBC 8.0 7.1 12.5*  RBC 4.33 4.43 4.78  HGB 12.9* 13.5 14.1  HCT 38.9* 39.6 43.9  MCV 89.8 89.4 91.8  MCH 29.8 30.5 29.5  MCHC 33.2 34.1 32.1  RDW 11.6 11.4* 11.7  PLT 174 184 210   Thyroid No results for input(s): TSH, FREET4 in the last 168 hours.  BNPNo results for input(s): BNP, PROBNP in the  last 168 hours.  DDimer No results for input(s): DDIMER in the last 168 hours.   Radiology    No results found.  Cardiac Studies   2D echo 04/01/21   1. Global mild to moderate hypokinesis with akinesis of the anterior,  anteroseptal wall and apex c/w LAD disease. Left ventricular ejection  fraction, by estimation, is 40 to 45%. The left ventricle has mildly  decreased function. The left ventricle  demonstrates regional wall motion abnormalities (see scoring  diagram/findings for description). There is mild concentric left  ventricular hypertrophy. Left ventricular diastolic parameters are  indeterminate.   2. Right ventricular systolic function was not well visualized. The right  ventricular size is not well visualized.   3. Left atrial size was moderately dilated.   4. Right atrium is dilated.   5. The mitral valve is grossly normal. No evidence of mitral valve  regurgitation.    6. The aortic valve is grossly normal. Aortic valve regurgitation is  mild. Aortic valve sclerosis/calcification is present, without any  evidence of aortic stenosis.   7. The inferior vena cava not well visualized.     Lexiscan Myoview 04/07/21:  IMPRESSION: 1. Large fixed defects are seen involving the anterior, apical and inferior myocardium consistent with old infarctions. Mild peri-infarct ischemia is noted anteriorly.   2. Moderate global hypomotility is noted.   3. Left ventricular ejection fraction 34%   4. Non invasive risk stratification*: High   Abdominal aortogram 04/04/21: Findings: Patient has tortuous external iliac arteries.  The common iliac and aorta are calcified.  Renal arteries without any stenoses.  Right SFA is flush occluded there is a large profunda giving many collaterals.  There is an [place] of distal SFA.  He then reconstitutes intertibial artery which flows to the foot.  Posterior tibial artery appears to reconstitute but it occludes above the ankle.  On the left side his common femoral artery is calcified without any flow-limiting stenosis.  SFA is heavily calcified but patent down to the popliteal.  Trifurcation is patent.  All tibial vessels are heavily diseased with posterior tibial being the dominant runoff.  Patient will be considered for right femoral to anterior tibial artery bypass and vein mapping will be ordered.  Patient Profile     88 y.o. 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  Due to severity of PAD plan for right SFA to anterior tibial bypass  surgery, with a request for preoperative cardiac clearance.  Assessment & Plan     Pre-operative risk assessment: h/o CAD s/p CABG/PCI (2006). He was admitted for cellulitis and abscess.  He had ABIs that were abnormal and concerning for critical limb ischemia.  He had a PV angio that showed an occluded right SFA with many collaterals.  It reconstitutes in the intertibial artery and the tibial artery.  The tibial artery is occluded above the ankle.  He also has left common femoral artery and heavily calcified left SFA that was patent to the level of the popliteal.  He was recommended that he have a right femoral to anterior tibial bypass.  Given that his legs prohibit him from getting much exercise a Lexiscan Myoview was done which showed Large fixed  defects are seen involving the anterior, apical and inferior myocardium consistent with old infarctions. Mild peri-infarct ischemia is noted anteriorly.  High risk study.  Echo with EF 40-45%. -LHC planned for today.  Ideally, plan medical management of CAD since he requires peripheral revascularization for critical limb ischemia.  Hyperlipidemia:   He declines statins as he reports having myalgias in the past.  This admission his total cholesterol is 111 and his LDL is 71.  Recommend that he be seen in lipid clinic as an outpatient.  Would recommend an LDL goal less than 55 given his multivessel disease.  Continue aspirin.         For questions or updates, please contact [place] Please consult [email or web address] for contact info under        Signed, Donato Heinz, MD  04/11/2021, 8:08 AM

## 2021-04-11 NOTE — Progress Notes (Signed)
Physical Therapy Treatment Patient Details Name: [name] MRN: [id number] DOB: May 04, 1933 Today's Date: 04/11/2021   History of Present Illness 88 y.o. male presents to ED with RLE wound being followed by outpatient podiatry. Increased pain over last 3-4 days, and outpatient podiatrist sent to ED 03/31/21. Foot x-ray shows concern for soft tissue gas/infection with a gas-forming organism. 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 s/p R common femoral artery and RLE angiogram, LLE angiography 1/23; pt to be considered for R femoral to anterior tibial artery bypass. PMH: coronary artery disease, ischemic cardiomyopathy with chronic systolic CHF with prior EF of 09%, history of CABG in 2006, a flutter status post ablation in 2014, VT, also sick sinus syndrome status post pacemaker.    PT Comments    Pt awaiting procedure today, complaining of bilat foot pain R>L. Pt tolerated short-distance gait in room with use of RW and min cuing for form/safety as well as steadying assist. Pt struggling to progress given RLE pain, PT strongly recommending post-op shoe donned on R foot for all OOB mobility. PT to continue to progress mobility as tolerated.      Recommendations for follow up therapy are one component of a multi-disciplinary discharge planning process, led by the attending physician.  Recommendations may be updated based on patient status, additional functional criteria and insurance authorization.  Follow Up Recommendations  Home health PT     Assistance Recommended at Discharge Intermittent Supervision/Assistance (for OOB)  Patient can return home with the following A little help with walking and/or transfers;A little help with bathing/dressing/bathroom;Assistance with cooking/housework;Assist for transportation;Help with stairs or ramp for entrance   Equipment Recommendations  Rolling walker (2  wheels);BSC/3in1    Recommendations for Other Services       Precautions / Restrictions Precautions Precautions: Fall Precaution Comments: don pt's shoes for gait - especially R post-op shoe Restrictions Weight Bearing Restrictions: No RLE Weight Bearing: Weight bearing as tolerated     Mobility  Bed Mobility Overal bed mobility: Needs Assistance Bed Mobility: Supine to Sit, Sit to Supine     Supine to sit: Supervision, HOB elevated     General bed mobility comments: increased time with use of bedrails    Transfers Overall transfer level: Needs assistance Equipment used: Rolling walker (2 wheels) Transfers: Sit to/from Stand Sit to Stand: Supervision           General transfer comment: for safety, cues for safe hand placement    Ambulation/Gait Ambulation/Gait assistance: Min assist Gait Distance (Feet): 20 Feet Assistive device: Rolling walker (2 wheels) Gait Pattern/deviations: Step-through pattern, Decreased stride length, Trunk flexed Gait velocity: decr     General Gait Details: assist to steady especially during directional changes, verbal cues for upright posture, placement in RW, navigating room.   Stairs             Wheelchair Mobility    Modified Rankin (Stroke Patients Only)       Balance Overall balance assessment: Needs assistance Sitting-balance support: Feet supported, No upper extremity supported Sitting balance-Leahy Scale: Fair     Standing balance support: Single extremity supported, Bilateral upper extremity supported, Reliant on assistive device for balance Standing balance-Leahy Scale: Poor                              Cognition Arousal/Alertness: Awake/alert Behavior During Therapy:  WFL for tasks assessed/performed Overall Cognitive Status: Within Functional Limits for tasks assessed                                          Exercises General Exercises - Lower Extremity Ankle  Circles/Pumps: AROM, Both, 5 reps, Seated (encouraged frequent ankle pumps and quad sets throughout the day to promote circulation) Long Arc Quad: AROM, Both, 10 reps, Seated    General Comments General comments (skin integrity, edema, etc.): VSS on RA      Pertinent Vitals/Pain Pain Assessment Pain Assessment: Faces Faces Pain Scale: Hurts even more Pain Location: R>L foot Pain Descriptors / Indicators: Throbbing, Sharp, Aching, Discomfort Pain Intervention(s): Limited activity within patient's tolerance, Monitored during session, Repositioned    Home Living                          Prior Function            PT Goals (current goals can now be found in the care plan section) Acute Rehab PT Goals Patient Stated Goal: go home PT Goal Formulation: With patient Time For Goal Achievement: 04/17/21 Potential to Achieve Goals: Good Progress towards PT goals: Not progressing toward goals - comment (pain limiting activity tolerance, awaiting surgery)    Frequency    Min 3X/week      PT Plan Current plan remains appropriate    Co-evaluation              AM-PAC PT "6 Clicks" Mobility   Outcome Measure  Help needed turning from your back to your side while in a flat bed without using bedrails?: A Little Help needed moving from lying on your back to sitting on the side of a flat bed without using bedrails?: A Little Help needed moving to and from a bed to a chair (including a wheelchair)?: A Little Help needed standing up from a chair using your arms (e.g., wheelchair or bedside chair)?: A Little Help needed to walk in hospital room?: A Little Help needed climbing 3-5 steps with a railing? : A Lot 6 Click Score: 17    End of Session   Activity Tolerance: Patient limited by pain;Patient limited by fatigue Patient left: in chair;with call bell/phone within reach;with chair alarm set Nurse Communication: Mobility status PT Visit Diagnosis: Other abnormalities  of gait and mobility (R26.89);Difficulty in walking, not elsewhere classified (R26.2);Pain     Time: 1020-1040 PT Time Calculation (min) (ACUTE ONLY): 20 min  Charges:  $Therapeutic Activity: 8-22 mins                     Marye Round, PT DPT Acute Rehabilitation Services Pager [phone]  Office [phone]    Edithe Dobbin E Christain [place] 04/11/2021, 12:06 PM

## 2021-04-12 ENCOUNTER — Encounter (HOSPITAL_COMMUNITY): Payer: Self-pay | Admitting: Internal Medicine

## 2021-04-12 ENCOUNTER — Inpatient Hospital Stay (HOSPITAL_COMMUNITY): Payer: Medicare Other | Admitting: Anesthesiology

## 2021-04-12 ENCOUNTER — Encounter (HOSPITAL_COMMUNITY)
Admission: EM | Disposition: A | Discharge status: Skilled Nursing Facility | Payer: Self-pay | Source: Home / Self Care | Attending: Internal Medicine

## 2021-04-12 DIAGNOSIS — E782 Mixed hyperlipidemia: Secondary | ICD-10-CM | POA: Diagnosis not present

## 2021-04-12 DIAGNOSIS — I70223 Atherosclerosis of native arteries of extremities with rest pain, bilateral legs: Secondary | ICD-10-CM | POA: Diagnosis not present

## 2021-04-12 DIAGNOSIS — I5042 Chronic combined systolic (congestive) and diastolic (congestive) heart failure: Secondary | ICD-10-CM

## 2021-04-12 DIAGNOSIS — I2510 Atherosclerotic heart disease of native coronary artery without angina pectoris: Secondary | ICD-10-CM | POA: Diagnosis not present

## 2021-04-12 DIAGNOSIS — I70235 Atherosclerosis of native arteries of right leg with ulceration of other part of foot: Secondary | ICD-10-CM

## 2021-04-12 HISTORY — PX: FEMORAL-TIBIAL BYPASS GRAFT: SHX938

## 2021-04-12 LAB — CREATININE, SERUM
Creatinine, Ser: 0.95 mg/dL (ref 0.61–1.24)
GFR, Estimated: >60 mL/min (ref >60)

## 2021-04-12 LAB — BASIC METABOLIC PANEL
Anion gap: 9 (ref 5–15)
BUN: 16 mg/dL (ref 8–23)
CO2: 21 mmol/L — ABNORMAL LOW (ref 22–32)
Calcium: 8.5 mg/dL — ABNORMAL LOW (ref 8.9–10.3)
Chloride: 105 mmol/L (ref 98–111)
Creatinine, Ser: 0.89 mg/dL (ref 0.61–1.24)
GFR, Estimated: >60 mL/min (ref >60)
Glucose, Bld: 158 mg/dL — ABNORMAL HIGH (ref 70–99)
Potassium: 4.2 mmol/L (ref 3.5–5.1)
Sodium: 135 mmol/L (ref 135–145)

## 2021-04-12 LAB — CBC
HCT: 38.8 % — ABNORMAL LOW (ref 39.0–52.0)
Hemoglobin: 12.9 g/dL — ABNORMAL LOW (ref 13.0–17.0)
MCH: 29.7 pg (ref 26.0–34.0)
MCHC: 33.2 g/dL (ref 30.0–36.0)
MCV: 89.4 fL (ref 80.0–100.0)
Platelets: 207 10*3/uL (ref 150–400)
RBC: 4.34 MIL/uL (ref 4.22–5.81)
RDW: 11.6 % (ref 11.5–15.5)
WBC: 7.2 10*3/uL (ref 4.0–10.5)
nRBC: 0 % (ref 0.0–0.2)

## 2021-04-12 LAB — TYPE AND SCREEN
ABO/RH(D): A NEG
Antibody Screen: NEGATIVE

## 2021-04-12 SURGERY — CREATION, BYPASS, ARTERIAL, FEMORAL TO TIBIAL, USING GRAFT
Anesthesia: General | Laterality: Right

## 2021-04-12 MED ORDER — ACETAMINOPHEN 500 MG PO TABS: 1000.0000 mg | ORAL_TABLET | Freq: Once | ORAL | Status: AC

## 2021-04-12 MED ORDER — ROCURONIUM BROMIDE 10 MG/ML (PF) SYRINGE
PREFILLED_SYRINGE | INTRAVENOUS | Status: AC
  Filled 2021-04-12: qty 30

## 2021-04-12 MED ORDER — CELECOXIB 200 MG PO CAPS
ORAL_CAPSULE | ORAL | Status: AC
  Administered 2021-04-12: 200 mg via ORAL
  Filled 2021-04-12: qty 1

## 2021-04-12 MED ORDER — OXYCODONE-ACETAMINOPHEN 5-325 MG PO TABS
1.0000 | ORAL_TABLET | ORAL | Status: DC | PRN
  Administered 2021-04-13: 2 via ORAL
  Administered 2021-04-13: 1 via ORAL
  Administered 2021-04-14 – 2021-04-15 (×3): 2 via ORAL
  Filled 2021-04-12 (×4): qty 2
  Filled 2021-04-12 (×2): qty 1
  Filled 2021-04-12: qty 2

## 2021-04-12 MED ORDER — DIPHENHYDRAMINE HCL 50 MG/ML IJ SOLN
INTRAMUSCULAR | Status: AC
  Filled 2021-04-12: qty 1

## 2021-04-12 MED ORDER — DEXAMETHASONE SODIUM PHOSPHATE 10 MG/ML IJ SOLN
INTRAMUSCULAR | Status: AC
  Filled 2021-04-12: qty 1

## 2021-04-12 MED ORDER — LACTATED RINGERS IV SOLN: INTRAVENOUS | Status: DC | PRN

## 2021-04-12 MED ORDER — PROMETHAZINE HCL 25 MG/ML IJ SOLN: 6.2500 mg | INTRAMUSCULAR | Status: DC | PRN

## 2021-04-12 MED ORDER — PHENOL 1.4 % MT LIQD: 1.0000 | OROMUCOSAL | Status: DC | PRN

## 2021-04-12 MED ORDER — HEPARIN 6000 UNIT IRRIGATION SOLUTION
Status: DC | PRN
Start: 2021-04-12 — End: 2021-04-12
  Administered 2021-04-12: 1

## 2021-04-12 MED ORDER — FENTANYL CITRATE (PF) 250 MCG/5ML IJ SOLN
INTRAMUSCULAR | Status: DC | PRN
Start: 2021-04-12 — End: 2021-04-12
  Administered 2021-04-12: 50 ug via INTRAVENOUS
  Administered 2021-04-12: 100 ug via INTRAVENOUS

## 2021-04-12 MED ORDER — LACTATED RINGERS IV SOLN: INTRAVENOUS | Status: DC

## 2021-04-12 MED ORDER — MAGNESIUM SULFATE 2 GM/50ML IV SOLN: 2.0000 g | Freq: Every day | INTRAVENOUS | Status: DC | PRN

## 2021-04-12 MED ORDER — SODIUM CHLORIDE 0.9 % IV SOLN: 500.0000 mL | Freq: Once | INTRAVENOUS | Status: DC | PRN

## 2021-04-12 MED ORDER — PROTAMINE SULFATE 10 MG/ML IV SOLN
INTRAVENOUS | Status: AC
  Filled 2021-04-12: qty 15

## 2021-04-12 MED ORDER — PAPAVERINE HCL 30 MG/ML IJ SOLN
INTRAMUSCULAR | Status: AC
  Filled 2021-04-12: qty 2

## 2021-04-12 MED ORDER — AMISULPRIDE (ANTIEMETIC) 5 MG/2ML IV SOLN: 10.0000 mg | Freq: Once | INTRAVENOUS | Status: DC | PRN

## 2021-04-12 MED ORDER — POTASSIUM CHLORIDE CRYS ER 20 MEQ PO TBCR: 20.0000 meq | EXTENDED_RELEASE_TABLET | Freq: Every day | ORAL | Status: DC | PRN

## 2021-04-12 MED ORDER — CHLORHEXIDINE GLUCONATE 0.12 % MT SOLN: 15.0000 mL | Freq: Once | OROMUCOSAL | Status: DC

## 2021-04-12 MED ORDER — SODIUM CHLORIDE 0.9 % IV SOLN: INTRAVENOUS | Status: DC

## 2021-04-12 MED ORDER — SENNOSIDES-DOCUSATE SODIUM 8.6-50 MG PO TABS: 1.0000 | ORAL_TABLET | Freq: Every evening | ORAL | Status: DC | PRN

## 2021-04-12 MED ORDER — SUGAMMADEX SODIUM 200 MG/2ML IV SOLN
INTRAVENOUS | Status: DC | PRN
  Administered 2021-04-12: 200 mg via INTRAVENOUS

## 2021-04-12 MED ORDER — DOCUSATE SODIUM 100 MG PO CAPS
100.0000 mg | ORAL_CAPSULE | Freq: Every day | ORAL | Status: DC
  Administered 2021-04-13 – 2021-04-14 (×2): 100 mg via ORAL
  Filled 2021-04-12 (×3): qty 1

## 2021-04-12 MED ORDER — HYDRALAZINE HCL 20 MG/ML IJ SOLN: 5.0000 mg | INTRAMUSCULAR | Status: DC | PRN

## 2021-04-12 MED ORDER — GUAIFENESIN-DM 100-10 MG/5ML PO SYRP
15.0000 mL | ORAL_SOLUTION | ORAL | Status: DC | PRN
  Administered 2021-04-12 – 2021-04-16 (×2): 15 mL via ORAL
  Filled 2021-04-12 (×2): qty 15

## 2021-04-12 MED ORDER — FENTANYL CITRATE (PF) 100 MCG/2ML IJ SOLN
25.0000 ug | INTRAMUSCULAR | Status: DC | PRN
  Administered 2021-04-12: 25 ug via INTRAVENOUS

## 2021-04-12 MED ORDER — ACETAMINOPHEN 500 MG PO TABS
ORAL_TABLET | ORAL | Status: AC
  Administered 2021-04-12: 1000 mg via ORAL
  Filled 2021-04-12: qty 2

## 2021-04-12 MED ORDER — CELECOXIB 200 MG PO CAPS: 200.0000 mg | ORAL_CAPSULE | Freq: Once | ORAL | Status: AC

## 2021-04-12 MED ORDER — HEMOSTATIC AGENTS (NO CHARGE) OPTIME
TOPICAL | Status: DC | PRN
  Administered 2021-04-12 (×2): 1 via TOPICAL

## 2021-04-12 MED ORDER — HEPARIN 6000 UNIT IRRIGATION SOLUTION
Status: AC
  Filled 2021-04-12: qty 500

## 2021-04-12 MED ORDER — FENTANYL CITRATE (PF) 250 MCG/5ML IJ SOLN
INTRAMUSCULAR | Status: AC
  Filled 2021-04-12: qty 5

## 2021-04-12 MED ORDER — CHLORHEXIDINE GLUCONATE 0.12 % MT SOLN
OROMUCOSAL | Status: AC
  Administered 2021-04-12: 15 mL
  Filled 2021-04-12: qty 15

## 2021-04-12 MED ORDER — CHLORHEXIDINE GLUCONATE CLOTH 2 % EX PADS
6.0000 | MEDICATED_PAD | Freq: Every day | CUTANEOUS | Status: DC
  Administered 2021-04-12: 6 via TOPICAL

## 2021-04-12 MED ORDER — PROTAMINE SULFATE 10 MG/ML IV SOLN
INTRAVENOUS | Status: DC | PRN
Start: 2021-04-12 — End: 2021-04-12
  Administered 2021-04-12: 50 mg via INTRAVENOUS

## 2021-04-12 MED ORDER — 0.9 % SODIUM CHLORIDE (POUR BTL) OPTIME
TOPICAL | Status: DC | PRN
Start: 2021-04-12 — End: 2021-04-12
  Administered 2021-04-12 (×2): 1000 mL

## 2021-04-12 MED ORDER — PROPOFOL 10 MG/ML IV BOLUS
INTRAVENOUS | Status: DC | PRN
Start: 2021-04-12 — End: 2021-04-12
  Administered 2021-04-12: 150 mg via INTRAVENOUS

## 2021-04-12 MED ORDER — LIDOCAINE 2% (20 MG/ML) 5 ML SYRINGE
INTRAMUSCULAR | Status: DC | PRN
  Administered 2021-04-12: 100 mg via INTRAVENOUS

## 2021-04-12 MED ORDER — FENTANYL CITRATE (PF) 100 MCG/2ML IJ SOLN
INTRAMUSCULAR | Status: AC
  Administered 2021-04-12: 25 ug via INTRAVENOUS
  Filled 2021-04-12: qty 2

## 2021-04-12 MED ORDER — HEPARIN SODIUM (PORCINE) 1000 UNIT/ML IJ SOLN
INTRAMUSCULAR | Status: DC | PRN
  Administered 2021-04-12: 9000 [IU] via INTRAVENOUS

## 2021-04-12 MED ORDER — ROCURONIUM BROMIDE 10 MG/ML (PF) SYRINGE
PREFILLED_SYRINGE | INTRAVENOUS | Status: DC | PRN
Start: 2021-04-12 — End: 2021-04-12
  Administered 2021-04-12: 100 mg via INTRAVENOUS
  Administered 2021-04-12: 20 mg via INTRAVENOUS

## 2021-04-12 MED ORDER — HYDROMORPHONE HCL 1 MG/ML IJ SOLN: 0.5000 mg | INTRAMUSCULAR | Status: DC | PRN

## 2021-04-12 MED ORDER — HEPARIN SODIUM (PORCINE) 1000 UNIT/ML IJ SOLN
INTRAMUSCULAR | Status: AC
  Filled 2021-04-12: qty 10

## 2021-04-12 MED ORDER — DEXAMETHASONE SODIUM PHOSPHATE 10 MG/ML IJ SOLN
INTRAMUSCULAR | Status: DC | PRN
  Administered 2021-04-12: 10 mg via INTRAVENOUS

## 2021-04-12 MED ORDER — ORAL CARE MOUTH RINSE: 15.0000 mL | Freq: Once | OROMUCOSAL | Status: DC

## 2021-04-12 SURGICAL SUPPLY — 61 items
ADH SKN CLS APL DERMABOND .7 (GAUZE/BANDAGES/DRESSINGS) ×1
BAG COUNTER SPONGE SURGICOUNT (BAG) ×2 IMPLANT
BAG SPNG CNTER NS LX DISP (BAG) ×1
BANDAGE ESMARK 6X9 LF (GAUZE/BANDAGES/DRESSINGS) IMPLANT
BNDG CMPR 9X6 STRL LF SNTH (GAUZE/BANDAGES/DRESSINGS)
BNDG ESMARK 6X9 LF (GAUZE/BANDAGES/DRESSINGS)
CANISTER SUCT 3000ML PPV (MISCELLANEOUS) ×2 IMPLANT
CANNULA VESSEL 3MM 2 BLNT TIP (CANNULA) IMPLANT
CLIP LIGATING EXTRA MED SLVR (CLIP) ×2 IMPLANT
CLIP LIGATING EXTRA SM BLUE (MISCELLANEOUS) ×2 IMPLANT
COVER PROBE W GEL 5X96 (DRAPES) ×1 IMPLANT
CUFF TOURN SGL QUICK 24 (TOURNIQUET CUFF)
CUFF TOURN SGL QUICK 34 (TOURNIQUET CUFF)
CUFF TOURN SGL QUICK 42 (TOURNIQUET CUFF) IMPLANT
CUFF TRNQT CYL 24X4X16.5-23 (TOURNIQUET CUFF) IMPLANT
CUFF TRNQT CYL 34X4.125X (TOURNIQUET CUFF) IMPLANT
DERMABOND ADVANCED (GAUZE/BANDAGES/DRESSINGS) ×1
DERMABOND ADVANCED .7 DNX12 (GAUZE/BANDAGES/DRESSINGS) ×1 IMPLANT
DRAIN CHANNEL 15F RND FF W/TCR (WOUND CARE) IMPLANT
DRAPE C-ARM 42X72 X-RAY (DRAPES) IMPLANT
DRAPE HALF SHEET 40X57 (DRAPES) IMPLANT
DRSG COVADERM 4X8 (GAUZE/BANDAGES/DRESSINGS) ×1 IMPLANT
ELECT REM PT RETURN 9FT ADLT (ELECTROSURGICAL) ×2
ELECTRODE REM PT RTRN 9FT ADLT (ELECTROSURGICAL) ×1 IMPLANT
EVACUATOR SILICONE 100CC (DRAIN) IMPLANT
GAUZE 4X4 16PLY ~~LOC~~+RFID DBL (SPONGE) ×2 IMPLANT
GLOVE SURG ENC MOIS LTX SZ7.5 (GLOVE) ×2 IMPLANT
GLOVE SURG UNDER POLY LF SZ7 (GLOVE) ×1 IMPLANT
GOWN STRL REUS W/ TWL LRG LVL3 (GOWN DISPOSABLE) ×2 IMPLANT
GOWN STRL REUS W/ TWL XL LVL3 (GOWN DISPOSABLE) ×1 IMPLANT
GOWN STRL REUS W/TWL LRG LVL3 (GOWN DISPOSABLE) ×4
GOWN STRL REUS W/TWL XL LVL3 (GOWN DISPOSABLE) ×2
GRAFT PROPATEN W/RING 6X80X60 (Vascular Products) ×1 IMPLANT
HEMOSTAT SNOW SURGICEL 2X4 (HEMOSTASIS) IMPLANT
INSERT FOGARTY SM (MISCELLANEOUS) IMPLANT
KIT BASIN OR (CUSTOM PROCEDURE TRAY) ×2 IMPLANT
KIT TURNOVER KIT B (KITS) ×2 IMPLANT
MARKER GRAFT CORONARY BYPASS (MISCELLANEOUS) IMPLANT
NS IRRIG 1000ML POUR BTL (IV SOLUTION) ×4 IMPLANT
PACK PERIPHERAL VASCULAR (CUSTOM PROCEDURE TRAY) ×2 IMPLANT
PAD ARMBOARD 7.5X6 YLW CONV (MISCELLANEOUS) ×4 IMPLANT
PENCIL BUTTON HOLSTER BLD 10FT (ELECTRODE) ×1 IMPLANT
POWDER SURGICEL 3.0 GRAM (HEMOSTASIS) ×2 IMPLANT
SET COLLECT BLD 21X3/4 12 (NEEDLE) IMPLANT
STAPLER VISISTAT 35W (STAPLE) ×1 IMPLANT
SUT ETHILON 3 0 PS 1 (SUTURE) IMPLANT
SUT MNCRL AB 4-0 PS2 18 (SUTURE) ×4 IMPLANT
SUT PROLENE 5 0 C 1 24 (SUTURE) ×2 IMPLANT
SUT PROLENE 6 0 BV (SUTURE) ×8 IMPLANT
SUT SILK 2 0 SH (SUTURE) ×2 IMPLANT
SUT SILK 3 0 (SUTURE)
SUT SILK 3-0 18XBRD TIE 12 (SUTURE) IMPLANT
SUT VIC AB 2-0 CT1 27 (SUTURE) ×4
SUT VIC AB 2-0 CT1 TAPERPNT 27 (SUTURE) ×2 IMPLANT
SUT VIC AB 3-0 SH 27 (SUTURE) ×4
SUT VIC AB 3-0 SH 27X BRD (SUTURE) ×2 IMPLANT
TOWEL GREEN STERILE (TOWEL DISPOSABLE) ×2 IMPLANT
TRAY FOLEY MTR SLVR 16FR STAT (SET/KITS/TRAYS/PACK) ×2 IMPLANT
TUBE CONNECTING 12X1/4 (SUCTIONS) ×1 IMPLANT
UNDERPAD 30X36 HEAVY ABSORB (UNDERPADS AND DIAPERS) ×2 IMPLANT
WATER STERILE IRR 1000ML POUR (IV SOLUTION) ×2 IMPLANT

## 2021-04-12 NOTE — Care Management Important Message (Signed)
Important Message  Patient Details  Name: [name] MRN: [id number] Date of Birth: [birth year]   Medicare Important Message Given:  Yes     Renie Ora 04/12/2021, 10:43 AM

## 2021-04-12 NOTE — Transfer of Care (Signed)
Immediate Anesthesia Transfer of Care Note  Patient: [name]  Procedure(s) Performed: RIGHT FEMORAL- ANTERIOR TIBIAL ARTERY BYPASS (Right)  Patient Location: PACU  Anesthesia Type:General  Level of Consciousness: awake and alert   Airway & Oxygen Therapy: Patient Spontanous Breathing  Post-op Assessment: Report given to RN and Post -op Vital signs reviewed and stable  Post vital signs: Reviewed and stable  Last Vitals:  Vitals Value Taken Time  BP 129/80 04/12/21 1548  Temp 36.7 C 04/12/21 1548  Pulse 79 04/12/21 1551  Resp 16 04/12/21 1551  SpO2 95 % 04/12/21 1551  Vitals shown include unvalidated device data.  Last Pain:  Vitals:   04/12/21 1548  TempSrc:   PainSc: 10-Worst pain ever         Complications: No notable events documented.

## 2021-04-12 NOTE — Progress Notes (Signed)
Progress Note  Patient Name: [name] Date of Encounter: 04/12/2021  [facility] Cardiologist: Norman Herrlich, MD   Subjective   Denies any chest pain or dyspnea   Inpatient Medications    Scheduled Meds:  aspirin EC  81 mg Oral Daily   enoxaparin (LOVENOX) injection  40 mg Subcutaneous Q24H   famotidine  20 mg Oral Daily   loratadine  10 mg Oral Daily   melatonin  5 mg Oral QHS   pantoprazole  40 mg Oral Daily   sodium chloride flush  3 mL Intravenous Q12H   tamsulosin  0.4 mg Oral QPC supper   Continuous Infusions:  sodium chloride 250 mL (04/12/21 0446)   levofloxacin (LEVAQUIN) IV 500 mg (04/11/21 1520)   metronidazole 500 mg (04/12/21 0449)   PRN Meds: sodium chloride, acetaminophen, alum & mag hydroxide-simeth, bisacodyl, camphor-menthol, diphenhydrAMINE, hydrOXYzine, ondansetron (ZOFRAN) IV, oxyCODONE, sodium chloride flush   Vital Signs    Vitals:   04/11/21 1820 04/11/21 1856 04/11/21 2020 04/12/21 0435  BP: (!) 121/44 128/65 (!) 127/57 122/67  Pulse: 69 82 72 64  Resp: 16 16 17 17   Temp:  99 F (37.2 C) 98.7 F (37.1 C) (!) 97.5 F (36.4 C)  TempSrc:  Oral Oral Oral  SpO2: 100% 100%  98%  Weight:      Height:        Intake/Output Summary (Last 24 hours) at 04/12/2021 0829 Last data filed at 04/11/2021 2150 Gross per 24 hour  Intake 240 ml  Output --  Net 240 ml    Last 3 Weights 04/11/2021 04/10/2021 04/09/2021  Weight (lbs) 191 lb 12.8 oz 191 lb 9.3 oz 190 lb 4.1 oz  Weight (kg) 87 kg 86.9 kg 86.3 kg      Telemetry    Atrial paced, ventricular sensed rhythm- Personally Reviewed  ECG    Atrial paced, ventricular sensed rhythm, RBBB plus LAFB, Q waves V1-V4- Personally Reviewed  Physical Exam  Overweight.  Generalized papular erythematous rash GEN: No acute distress.   Neck: No JVD Cardiac: RRR, no murmurs Respiratory: Clear to auscultation bilaterally. GI: Soft, nontender, non-distended  MS: No edema; No deformity. Neuro:   Nonfocal  Psych: Normal affect   Labs    High Sensitivity Troponin:  No results for input(s): TROPONINIHS in the last 720 hours.   Chemistry Recent Labs  Lab 04/10/21 0245 04/11/21 0733 04/12/21 0320  NA 136 136 135  K 3.9 3.9 4.2  CL 107 104 105  CO2 20* 21* 21*  GLUCOSE 99 102* 158*  BUN 13 14 16   CREATININE 0.94 0.98 0.89  CALCIUM 8.4* 8.5* 8.5*  GFRNONAA >60 >60 >60  ANIONGAP 9 11 9      Lipids  No results for input(s): CHOL, TRIG, HDL, LABVLDL, LDLCALC, CHOLHDL in the last 168 hours.   Hematology Recent Labs  Lab 04/10/21 0245 04/11/21 0733 04/12/21 0320  WBC 12.5* 10.7* 7.2  RBC 4.78 4.69 4.34  HGB 14.1 13.8 12.9*  HCT 43.9 42.2 38.8*  MCV 91.8 90.0 89.4  MCH 29.5 29.4 29.7  MCHC 32.1 32.7 33.2  RDW 11.7 11.9 11.6  PLT 210 217 207    Thyroid No results for input(s): TSH, FREET4 in the last 168 hours.  BNPNo results for input(s): BNP, PROBNP in the last 168 hours.  DDimer No results for input(s): DDIMER in the last 168 hours.   Radiology    CARDIAC CATHETERIZATION  Result Date: 04/11/2021 Conclusions: Severe native coronary artery disease,  including chronic total occlusions of mid/distal LMCA, mid LAD (site of prior PCI), and ostial rPDA.  There is also irregular mid LAD disease beyond the occluded stent of up to 50%, sequential 30-70% proximal through distal LCx lesions, and sequential 80% proximal and 95% distal RCA stenoses. Widely patent LIMA-LAD. Patent SVG-ramus intermedius with occlusion of sequential portion of the graft to OM3.  SVG-ramus backfills the LCx and proximal LAD. Chronically occluded SVG-D1. Mildly elevated left ventricular filling pressure (LVEDP 18 mmHg). Recommendations: Images reviewed with Dr. Bjorn Pippin.  Given lack of angina and minimal periinfarct ischemia on recent stress test, medical management is preferred.  I doubt that PCI to proximal and distal RCA would significantly alter the patient's perioperative cardiac risk for planned  fem-tib bypass tomorrow but could delay intervention aimed at treating his critical limb ischemia. If Mr. Bergdoll develops angina, PCI to proximal and distal RCA could be considered. Aggressive secondary prevention.  PCSK9 inhibitor should be considered as an outpatient, given history of statin intolerance. Yvonne Kendall, MD [facility]   Cardiac Studies   2D echo 04/01/21   1. Global mild to moderate hypokinesis with akinesis of the anterior,  anteroseptal wall and apex c/w LAD disease. Left ventricular ejection  fraction, by estimation, is 40 to 45%. The left ventricle has mildly  decreased function. The left ventricle  demonstrates regional wall motion abnormalities (see scoring  diagram/findings for description). There is mild concentric left  ventricular hypertrophy. Left ventricular diastolic parameters are  indeterminate.   2. Right ventricular systolic function was not well visualized. The right  ventricular size is not well visualized.   3. Left atrial size was moderately dilated.   4. Right atrium is dilated.   5. The mitral valve is grossly normal. No evidence of mitral valve  regurgitation.   6. The aortic valve is grossly normal. Aortic valve regurgitation is  mild. Aortic valve sclerosis/calcification is present, without any  evidence of aortic stenosis.   7. The inferior vena cava not well visualized.     Lexiscan Myoview 04/07/21:  IMPRESSION: 1. Large fixed defects are seen involving the anterior, apical and inferior myocardium consistent with old infarctions. Mild peri-infarct ischemia is noted anteriorly.   2. Moderate global hypomotility is noted.   3. Left ventricular ejection fraction 34%   4. Non invasive risk stratification*: High   Abdominal aortogram 04/04/21: Findings: Patient has tortuous external iliac arteries.  The common iliac and aorta are calcified.  Renal arteries without any stenoses.  Right SFA is flush occluded there is a large profunda  giving many collaterals.  There is an [place] of distal SFA.  He then reconstitutes intertibial artery which flows to the foot.  Posterior tibial artery appears to reconstitute but it occludes above the ankle.  On the left side his common femoral artery is calcified without any flow-limiting stenosis.  SFA is heavily calcified but patent down to the popliteal.  Trifurcation is patent.  All tibial vessels are heavily diseased with posterior tibial being the dominant runoff.  Patient will be considered for right femoral to anterior tibial artery bypass and vein mapping will be ordered.  Patient Profile     88 y.o. male with critical limb ischemia both lower extremities and right foot cellulitis/abscess, history of CAD with remote CABG (2006) chronic combined systolic and diastolic heart failure, sick sinus syndrome and bifascicular block status post Biotronik dual-chamber pacemaker, history of nonsustained VT and PVCs, history of atrial flutter status post previous ablation, hypertension, hyperlipidemia  with reported myopathy-related statin intolerance.  Due to severity of PAD plan for right SFA to anterior tibial bypass surgery, with a request for preoperative cardiac clearance.  Assessment & Plan     Pre-operative risk assessment: h/o CAD s/p CABG/PCI (2006). He was admitted for cellulitis and abscess.  He had ABIs that were abnormal and concerning for critical limb ischemia.  He had a PV angio that showed an occluded right SFA with many collaterals.  It reconstitutes in the intertibial artery and the tibial artery.  The tibial artery is occluded above the ankle.  He also has left common femoral artery and heavily calcified left SFA that was patent to the level of the popliteal.  He was recommended that he have a right femoral to anterior tibial bypass.  Given that his legs prohibit him from getting much exercise a Lexiscan Myoview was done which showed Large fixed defects are seen involving the anterior,  apical and inferior myocardium consistent with old infarctions. Mild peri-infarct ischemia is noted anteriorly.  High risk study.  Echo with EF 40-45%.  LHC showed patent LIMA-LAD, patent SVG-ramus with occlusion of sequential portion of graft to OM 3, occluded SVG-D1, 80% proximal RCA stenosis, 95% distal RCA stenosis. -Discussed cath results with Dr. Okey DupreEnd.  Given lack of symptoms and not wanting to delay intervention for his critical limb ischemia, no PCI done.  If he does develop angina, could consider PCI to proximal and distal RCA.  CAD: Cath results as above.  Denies any anginal symptoms.  Continue ASA.  Plan PCSK9 inhibitor evaluation as outpatient.  Chronic combined systolic and diastolic heart failure: EF 40 to 45% on echo 04/01/2021.  Can add GDMT as tolerated after his surgery  PAD: Critical limb ischemia, planning right femoral to anterior tibial bypass today  HLD: LDL 71, goal 55 given CAD, PAD as above.  He declined statins due to myalgias.  Will need outpatient evaluation for PCSK9 inhibitor   SSS: s/p PPM.     For questions or updates, please contact [facility] Please consult [email or web address] for contact info under        Signed, Little Ishikawahristopher L Trenten Watchman, MD  04/12/2021, 8:29 AM

## 2021-04-12 NOTE — Op Note (Signed)
Patient name: [name] MRN: [id number] DOB: [birth year] Sex: male  04/12/2021 Pre-operative Diagnosis: Critical right lower extremity ischemia with wound Post-operative diagnosis:  Same Surgeon:  Erlene Quan C. Donzetta Matters, MD Assistants: Gae Gallop, MD; Laurence Slate, PA; Sabino Dick, MS 3 Procedure Performed: 1.  Right common femoral to anterior tibial artery bypass with distal vein patch angioplasty with 6 mm ringed PTFE 2.  Harvest right greater saphenous vein  Indications: 88 year old male with history of great toe ulceration on the right.  Angiography demonstrated long segment occlusion of the SFA and below-knee popliteal artery with runoff to the foot only via the anterior tibial artery.  He does not have suitable vein for bypass he is now indicated for bypass with PTFE.  An assistant was necessary to facilitate exposure and expedite the case.  Findings: Common femoral artery was free of disease.  Intertibial artery was calcified although there was good backbleeding from distal patch angioplasty was placed with saphenous vein prior to distal anastomosis with PTFE.  At completion there was very strong signal at the ankle that was graft dependent.   Procedure:  The patient was identified in the holding area and taken to the operating was placed supine operative table and general anesthesia induced.  He was sterilely prepped draped in the right lower extremity usual fashion, antibiotics were administered and a timeout was called.  Ultrasound was used to identify the common femoral artery transverse incision was made in the groin.  We dissected down around the common femoral artery placed Vesseloops around it in 2 locations.  Concomitantly the ultrasound used to identify the intertibial artery and the anterior compartment in the mid leg.  Longitudinal incision was made.  We dissected down to the muscle the muscle split between the 2 muscles to spare division.  We identified the neurovascular  bundle.  One of the paired tibial veins was divided.  The artery was identified.  There was good monophasic signal when the artery with Doppler.  We tunneled 6 mm ringed PTFE between the 2 incisions.  From the groin incision we did harvest the greater saphenous vein for approximately 3 cm was tied off proximally and distally was very small vein past this it was also quite thickened.  Patient was fully heparinized.  In the groin we clean proximally and distally trimmed the graft to size and sewed end-to-side with six 5-0 Prolene suture.  We then flushed through the graft.  Distally we clamped the artery distally and proximally opened longitudinally.  There was good antegrade and backbleeding.  We trimmed the vein piece to size and sewed this in place as a vein patch angioplasty with 6-0 Prolene suture.  Upon completion we then opened the vein longitudinally trimmed the graft to size with the leg straight and and sewn into side with 6-0 Prolene suture.  Prior completion this time we allowed flushing in all directions.  Upon completion there was very good signal distally all the way at the ankle that was graft dependent.  50 mg of protamine was administered.  We then closed the groin in layers with Vicryl and Monocryl.  The distal incision was closed with Vicryl given that it was somewhat tight we then closed the skin with staples.  Sterile dressing was placed.  He was awakened from anesthesia having tolerated procedure without any complication.  All counts were correct at completion.  EBL: 200 cc \  Ekam Besson C. Donzetta Matters, MD [facility] Office: [phone] Pager: [phone]

## 2021-04-12 NOTE — Progress Notes (Signed)
PT Cancellation Note  Patient Details Name: [name] MRN: [id number] DOB: 08-Jul-1933   Cancelled Treatment:    Reason Eval/Treat Not Completed: Other (comment). Pt reports his foot pain is currently controlled and he is afraid if he gets up that he will have too much pain prior to procedure. Will continue to follow.   Angelina Ok [facility] 04/12/2021, 11:50 AM Skip Mayer PT [facility] Pager [phone] Office [phone]

## 2021-04-12 NOTE — Progress Notes (Signed)
°Progress Note ° ° ° °04/12/2021 °7:34 AM °1 Day Post-Op ° °Subjective:  Doing well. No complaints. NPO for bypass today ° ° °Vitals:  ° 04/11/21 2020 04/12/21 0435  °BP: (!) 127/57 122/67  °Pulse: 72 64  °Resp: 17 17  °Temp: 98.7 °F (37.1 °C) (!) 97.5 °F (36.4 °C)  °SpO2:  98%  ° °Physical Exam: °Lungs:  non labored °Incisions:  L groin cath site without hematoma °Extremities:  R foot dry wound  °Neurologic: A&O °Pt with drug reaction - rash present, ° °CBC °   °Component Value Date/Time  ° WBC 7.2 04/12/2021 0320  ° RBC 4.34 04/12/2021 0320  ° HGB 12.9 (L) 04/12/2021 0320  ° HCT 38.8 (L) 04/12/2021 0320  ° PLT 207 04/12/2021 0320  ° MCV 89.4 04/12/2021 0320  ° MCH 29.7 04/12/2021 0320  ° MCHC 33.2 04/12/2021 0320  ° RDW 11.6 04/12/2021 0320  ° LYMPHSABS 0.9 03/31/2021 1351  ° MONOABS 0.4 03/31/2021 1351  ° EOSABS 0.0 03/31/2021 1351  ° BASOSABS 0.0 03/31/2021 1351  ° ° °BMET °   °Component Value Date/Time  ° NA 135 04/12/2021 0320  ° NA 140 07/08/2019 0932  ° K 4.2 04/12/2021 0320  ° CL 105 04/12/2021 0320  ° CO2 21 (L) 04/12/2021 0320  ° GLUCOSE 158 (H) 04/12/2021 0320  ° BUN 16 04/12/2021 0320  ° BUN 21 07/08/2019 0932  ° CREATININE 0.89 04/12/2021 0320  ° CALCIUM 8.5 (L) 04/12/2021 0320  ° GFRNONAA >60 04/12/2021 0320  ° GFRAA 89 07/08/2019 0932  ° ° °INR °No results found for: INR ° °No intake or output data in the 24 hours ending 04/12/21 0734 ° °Conclusions: °Severe native coronary artery disease, including chronic total occlusions of mid/distal LMCA, mid LAD (site of prior PCI), and ostial rPDA.  There is also irregular mid LAD disease beyond the occluded stent of up to 50%, sequential 30-70% proximal through distal LCx lesions, and sequential 80% proximal and 95% distal RCA stenoses. °Widely patent LIMA-LAD. °Patent SVG-ramus intermedius with occlusion of sequential portion of the graft to OM3.  SVG-ramus backfills the LCx and proximal LAD. °Chronically occluded SVG-D1. °Mildly elevated left ventricular  filling pressure (LVEDP 18 mmHg). °  °Recommendations: °Images reviewed with Dr. Schumann.  Given lack of angina and minimal periinfarct ischemia on recent stress test, medical management is preferred.  I doubt that PCI to proximal and distal RCA would significantly alter the patient's perioperative cardiac risk for planned fem-tib bypass tomorrow but could delay intervention aimed at treating his critical limb ischemia. °If Mr. Cooperwood develops angina, PCI to proximal and distal RCA could be considered. °Aggressive secondary prevention.  PCSK9 inhibitor should be considered as an outpatient, given history of statin intolerance. °  °Christopher End, MD ° ° °Assessment/Plan:  88 y.o. male is s/p RLE arteriogram 1 Day Post-Op  ° °Doing well this morning, no complaints °Intermittent pain of the right great toe °No osteomyelitis of the right great toe on MRI °The patient does not have usable vein, and therefore PTFE will be used as the conduit. °Being that he does not have osteomyelitis, will discuss with podiatry further °He is aware this is a high risk surgery, and that plastic conduit has a higher failure rate.  Unfortunately, this only options are amputation or bypass. °Plan for femoral to AT bypass today.  °With my partner Dr. Brandon Cain. ° ° °J. Eli Nasya Vincent, MD °[facility] °Office Phone Number: [phone] °04/12/2021 7:34 AM ° ° ° ° °

## 2021-04-12 NOTE — Anesthesia Procedure Notes (Signed)
Procedure Name: Intubation Date/Time: 04/12/2021 1:35 PM Performed by: Darryl Nestle, CRNA Pre-anesthesia Checklist: Patient identified, Emergency Drugs available, Suction available and Patient being monitored Patient Re-evaluated:Patient Re-evaluated prior to induction Oxygen Delivery Method: Circle system utilized Preoxygenation: Pre-oxygenation with 100% oxygen Induction Type: IV induction Ventilation: Mask ventilation without difficulty Laryngoscope Size: Miller and 3 Grade View: Grade I Tube type: Oral Tube size: 7.5 mm Number of attempts: 1 Airway Equipment and Method: Stylet and Oral airway Placement Confirmation: ETT inserted through vocal cords under direct vision, positive ETCO2 and breath sounds checked- equal and bilateral Secured at: 23 cm Tube secured with: Tape Dental Injury: Teeth and Oropharynx as per pre-operative assessment  Comments: Performed by S. Buerk, SRNA

## 2021-04-12 NOTE — H&P (View-Only) (Signed)
Progress Note    04/12/2021 7:34 AM 1 Day Post-Op  Subjective:  Doing well. No complaints. NPO for bypass today   Vitals:   04/11/21 2020 04/12/21 0435  BP: (!) 127/57 122/67  Pulse: 72 64  Resp: 17 17  Temp: 98.7 F (37.1 C) (!) 97.5 F (36.4 C)  SpO2:  98%   Physical Exam: Lungs:  non labored Incisions:  L groin cath site without hematoma Extremities:  R foot dry wound  Neurologic: A&O Pt with drug reaction - rash present,  CBC    Component Value Date/Time   WBC 7.2 04/12/2021 0320   RBC 4.34 04/12/2021 0320   HGB 12.9 (L) 04/12/2021 0320   HCT 38.8 (L) 04/12/2021 0320   PLT 207 04/12/2021 0320   MCV 89.4 04/12/2021 0320   MCH 29.7 04/12/2021 0320   MCHC 33.2 04/12/2021 0320   RDW 11.6 04/12/2021 0320   LYMPHSABS 0.9 03/31/2021 1351   MONOABS 0.4 03/31/2021 1351   EOSABS 0.0 03/31/2021 1351   BASOSABS 0.0 03/31/2021 1351    BMET    Component Value Date/Time   NA 135 04/12/2021 0320   NA 140 07/08/2019 0932   K 4.2 04/12/2021 0320   CL 105 04/12/2021 0320   CO2 21 (L) 04/12/2021 0320   GLUCOSE 158 (H) 04/12/2021 0320   BUN 16 04/12/2021 0320   BUN 21 07/08/2019 0932   CREATININE 0.89 04/12/2021 0320   CALCIUM 8.5 (L) 04/12/2021 0320   GFRNONAA >60 04/12/2021 0320   GFRAA 89 07/08/2019 0932    INR No results found for: INR  No intake or output data in the 24 hours ending 04/12/21 0734  Conclusions: Severe native coronary artery disease, including chronic total occlusions of mid/distal LMCA, mid LAD (site of prior PCI), and ostial rPDA.  There is also irregular mid LAD disease beyond the occluded stent of up to 50%, sequential 30-70% proximal through distal LCx lesions, and sequential 80% proximal and 95% distal RCA stenoses. Widely patent LIMA-LAD. Patent SVG-ramus intermedius with occlusion of sequential portion of the graft to OM3.  SVG-ramus backfills the LCx and proximal LAD. Chronically occluded SVG-D1. Mildly elevated left ventricular  filling pressure (LVEDP 18 mmHg).   Recommendations: Images reviewed with Dr. Gardiner Rhyme.  Given lack of angina and minimal periinfarct ischemia on recent stress test, medical management is preferred.  I doubt that PCI to proximal and distal RCA would significantly alter the patient's perioperative cardiac risk for planned fem-tib bypass tomorrow but could delay intervention aimed at treating his critical limb ischemia. If Mr. Wojnarowski develops angina, PCI to proximal and distal RCA could be considered. Aggressive secondary prevention.  PCSK9 inhibitor should be considered as an outpatient, given history of statin intolerance.   Nelva Bush, MD   Assessment/Plan:  88 y.o. male is s/p RLE arteriogram 1 Day Post-Op   Doing well this morning, no complaints Intermittent pain of the right great toe No osteomyelitis of the right great toe on MRI The patient does not have usable vein, and therefore PTFE will be used as the conduit. Being that he does not have osteomyelitis, will discuss with podiatry further He is aware this is a high risk surgery, and that plastic conduit has a higher failure rate.  Unfortunately, this only options are amputation or bypass. Plan for femoral to AT bypass today.  With my partner Dr. Servando Snare.   Cassandria Santee, MD [facility] Phone Number: [phone] 04/12/2021 7:34 AM

## 2021-04-12 NOTE — Progress Notes (Signed)
PROGRESS NOTE    [name]  [id number] DOB: January 15, 1934 DOA: 03/31/2021 PCP: Raina Mina., MD   Brief Narrative:  88 y.o. male with medical history significant of coronary artery disease, ischemic cardiomyopathy with chronic systolic CHF with prior EF of 25%, history of CABG in 2006, a flutter status post ablation in 2014, VT, also sick sinus syndrome status post pacemaker.  He has been dealing with a right lower extremity callus for several weeks, seen as an outpatient by podiatry.  Over the last 3 to 4 days, he has noticed increased pain to the right lower extremity, increased swelling and redness.  He has a degree of neuropathy and does not feel much.  He was seen in podiatrist office and was sent to the hospital.  Vascular surgery as well as podiatry consulted, arteriogram shows significant right lower extremity arterial disease now being considered for bypass surgery  Assessment & Plan:   Principal Problem:   Cellulitis and abscess of foot Active Problems:   Abnormal nuclear stress test   CAD (coronary artery disease) with history of CABG   Hx of CABG   Hyperlipidemia   Dizziness   Presence of cardiac pacemaker   Sick sinus syndrome ([place]) status post pacemaker   Chronic combined systolic and diastolic heart failure (HCC)   Macular degeneration   PAD (peripheral artery disease) (HCC)   Paroxysmal atrial flutter (HCC)   Urinary hesitancy   Critical limb ischemia of both lower extremities (HCC)   Rash   Abnormal nuclear cardiac imaging test  Cellulitis and abscess of foot/critical limb ischemia of both lower extremities/PAD- (present on admission) - Patient admitted with right foot cellulitis and abscess.  - Could not get an MRI due to pacemaker.  -initially: pus draining from right greater toe/Cellulitis extends up to mid upper shins.  -antibiotics-- IV vanc/zosyn initially, narrowed to rocephin but patient developed an itching rash (unclear if related to new abx but  timing coincides)- changed back to vanc/zosyn (also redness on IV rocephin seems to have worsened).  Due to no improvement in the rash, antibiotics were transitioned from zosyn/vanc to flagyl/levo. Would consider adding linezolid if clinical worsening for mrsa coverage -Blood cultures: no growth to date. Wound culture skin flora -Vascular surgery consulted, underwent arteriogram 1/23, and he is scheduled today for right femoral to anterior tibial artery bypass.  -Podiatry consulted as well:.  Bone scan does not show osteomyelitis.  No intervention recommended by podiatry.   Drug/morbilliform rash -unclear cause-- suspect abx as timing seems to co-incide, doubt pain medication. Patient thinks related to bandage on his lower back.  Itching has improved significantly and rash is also improving as expected.  Continue benadryl/H2/H1 blocker - abx changed as above   Urinary hesitancy - Possibly due to BPH.  This is been going on for quite some time, but most recently has noticed that he needs to urinate more often during the nighttime.  He is supposed to see urology outpatient - started on Flomax while here, no further issues, urinating well   Paroxysmal atrial flutter ([place]) - history of ablation, now has pacemaker.  Apparently he was on Coumadin in the past, and per care everywhere notes, in 2018, his Coumadin was stopped as he had no recurrence for A. fib or flutter for 1.5 years at that time.   Chronic systolic CHF (congestive heart failure) (HCC)/preoperative risk assessment/history of CAD s/p CABG/PCI -Last seen cardiology at [facility], Dr. Ola Spurr, in 2018.  It was mentioned that the  EF at that time was 40 to 45%, and recently had a stress test which was negative.  2D echo done here shows EF 40-45% with mildly decreased LV function.  Suspect unchanged.  He has no heart failure symptoms, no angina.  Currently he is not on goal-directed therapy, not clear when he stopped the medications but back in 2018  he was on metoprolol 12.5 twice daily as well as aspirin.  Discussed with the daughter, he has self discontinued the medication as he just does not like to take pills.  Currently appears stable.  Vascular surgery requested cardiac risk assessment.  Patient underwent LHC on 04/11/2021 which showed patent LIMA-LAD, patent SVG-ramus with occlusion of sequential portion of graft to OM 3, occluded SVG-D1, 80% proximal RCA stenosis, 95% distal RCA stenosis. Given lack of symptoms and not wanting to delay intervention for his critical limb ischemia, no PCI done.  If he does develop angina, could consider PCI to proximal and distal RCA.   Sick sinus syndrome ([place]) status post pacemaker- (present on admission) - Keep on medical telemetry.  He is paced   Dizziness -Chronic    Hyperlipidemia- (present on admission) - Has not been taking statin at home.  LDL at 71  Essential hypertension-resolved as of 04/06/2021 -Monitor blood pressure, currently not taking any medications, has remained within acceptable parameters   DVT prophylaxis: enoxaparin (LOVENOX) injection 40 mg Start: 04/11/21 2200   Code Status: Full Code  Family Communication:  None present at bedside.  Plan of care discussed with patient in length and he/she verbalized understanding and agreed with it.  Status is: Inpatient  Remains inpatient appropriate because: Needs cardiac cath and revascularization for PAD.  Estimated body mass index is 27.52 kg/m as calculated from the following:   Height as of this encounter: 5\' 10"  (1.778 m).   Weight as of this encounter: 87 kg.    Nutritional Assessment: Body mass index is 27.52 kg/m.Marland Kitchen Seen by dietician.  I agree with the assessment and plan as outlined below: Nutrition Status:        . Skin Assessment: I have examined the patient's skin and I agree with the wound assessment as performed by the wound care RN as outlined below:    Consultants:  Cardiology Vascular  surgery  Procedures:  As above  Antimicrobials:  Anti-infectives (From admission, onward)    Start     Dose/Rate Route Frequency Ordered Stop   04/10/21 1400  levofloxacin (LEVAQUIN) IVPB 500 mg        500 mg 100 mL/hr over 60 Minutes Intravenous Every 24 hours 04/10/21 1313     04/10/21 1345  metroNIDAZOLE (FLAGYL) IVPB 500 mg        500 mg 100 mL/hr over 60 Minutes Intravenous Every 12 hours 04/10/21 1246     04/10/21 0000  vancomycin (VANCOREADY) IVPB 1250 mg/250 mL  Status:  Discontinued        1,250 mg 166.7 mL/hr over 90 Minutes Intravenous Every 24 hours 04/09/21 0840 04/10/21 1246   04/09/21 1000  piperacillin-tazobactam (ZOSYN) IVPB 3.375 g  Status:  Discontinued        3.375 g 12.5 mL/hr over 240 Minutes Intravenous Every 8 hours 04/09/21 0840 04/10/21 1246   04/09/21 0930  vancomycin (VANCOREADY) IVPB 1250 mg/250 mL        1,250 mg 166.7 mL/hr over 90 Minutes Intravenous  Once 04/09/21 0810 04/09/21 1109   04/07/21 1400  cefTRIAXone (ROCEPHIN) 2 g in sodium chloride 0.9 %  100 mL IVPB  Status:  Discontinued        2 g 200 mL/hr over 30 Minutes Intravenous Every 24 hours 04/07/21 1035 04/09/21 0809   04/03/21 1800  vancomycin (VANCOREADY) IVPB 1250 mg/250 mL  Status:  Discontinued        1,250 mg 166.7 mL/hr over 90 Minutes Intravenous Every 24 hours 04/03/21 1455 04/07/21 1035   04/01/21 1830  vancomycin (VANCOREADY) IVPB 1750 mg/350 mL  Status:  Discontinued        1,750 mg 175 mL/hr over 120 Minutes Intravenous Every 24 hours 03/31/21 1632 04/03/21 1455   04/01/21 0630  clindamycin (CLEOCIN) IVPB 600 mg  Status:  Discontinued        600 mg 100 mL/hr over 30 Minutes Intravenous Every 8 hours 04/01/21 0540 04/04/21 1119   04/01/21 0000  piperacillin-tazobactam (ZOSYN) IVPB 3.375 g  Status:  Discontinued        3.375 g 12.5 mL/hr over 240 Minutes Intravenous Every 8 hours 03/31/21 1708 04/07/21 1035   03/31/21 2200  clindamycin (CLEOCIN) IVPB 600 mg  Status:   Discontinued        600 mg 100 mL/hr over 30 Minutes Intravenous Every 8 hours 03/31/21 1935 04/01/21 0540   03/31/21 1945  vancomycin (VANCOCIN) IVPB 1000 mg/200 mL premix  Status:  Discontinued        1,000 mg 200 mL/hr over 60 Minutes Intravenous  Once 03/31/21 1935 03/31/21 1944   03/31/21 1945  piperacillin-tazobactam (ZOSYN) IVPB 3.375 g  Status:  Discontinued        3.375 g 100 mL/hr over 30 Minutes Intravenous  Once 03/31/21 1935 03/31/21 1944   03/31/21 1630  vancomycin (VANCOREADY) IVPB 2000 mg/400 mL        2,000 mg 200 mL/hr over 120 Minutes Intravenous  Once 03/31/21 1619 03/31/21 1935   03/31/21 1600  vancomycin (VANCOREADY) IVPB 1750 mg/350 mL  Status:  Discontinued        1,750 mg 175 mL/hr over 120 Minutes Intravenous  Once 03/31/21 1541 03/31/21 1619   03/31/21 1545  piperacillin-tazobactam (ZOSYN) IVPB 3.375 g        3.375 g 100 mL/hr over 30 Minutes Intravenous  Once 03/31/21 1537 03/31/21 1633         Subjective:  Seen and examined.  Overall feels better.  No itching.  No other complaint.  Objective: Vitals:   04/11/21 1820 04/11/21 1856 04/11/21 2020 04/12/21 0435  BP: (!) 121/44 128/65 (!) 127/57 122/67  Pulse: 69 82 72 64  Resp: 16 16 17 17   Temp:  99 F (37.2 C) 98.7 F (37.1 C) (!) 97.5 F (36.4 C)  TempSrc:  Oral Oral Oral  SpO2: 100% 100%  98%  Weight:      Height:        Intake/Output Summary (Last 24 hours) at 04/12/2021 1014 Last data filed at 04/11/2021 2150 Gross per 24 hour  Intake 240 ml  Output --  Net 240 ml    Filed Weights   04/09/21 0413 04/10/21 0500 04/11/21 0500  Weight: 86.3 kg 86.9 kg 87 kg    Examination:  General exam: Appears calm and comfortable  Respiratory system: Clear to auscultation. Respiratory effort normal. Cardiovascular system: S1 & S2 heard, RRR. No JVD, murmurs, rubs, gallops or clicks. No pedal edema. Gastrointestinal system: Abdomen is nondistended, soft and nontender. No organomegaly or masses  felt. Normal bowel sounds heard. Central nervous system: Alert and oriented. No focal neurological deficits. Extremities: Symmetric  5 x 5 power. Skin: Morbilliform rash all over the body sparing face hands and feet but this is also fading away. Psychiatry: Judgement and insight appear normal. Mood & affect appropriate.   Data Reviewed: I have personally reviewed following labs and imaging studies  CBC: Recent Labs  Lab 04/06/21 0422 04/08/21 0419 04/10/21 0245 04/11/21 0733 04/12/21 0320  WBC 8.0 7.1 12.5* 10.7* 7.2  HGB 12.9* 13.5 14.1 13.8 12.9*  HCT 38.9* 39.6 43.9 42.2 38.8*  MCV 89.8 89.4 91.8 90.0 89.4  PLT 174 184 210 217 [id number]    Basic Metabolic Panel: Recent Labs  Lab 04/06/21 0422 04/08/21 0419 04/10/21 0245 04/11/21 0733 04/12/21 0320  NA 136 137 136 136 135  K 3.7 3.4* 3.9 3.9 4.2  CL 108 107 107 104 105  CO2 21* 22 20* 21* 21*  GLUCOSE 103* 98 99 102* 158*  BUN 10 10 13 14 16   CREATININE 0.86 0.81 0.94 0.98 0.89  CALCIUM 8.4* 8.4* 8.4* 8.5* 8.5*    GFR: Estimated Creatinine Clearance: 62.7 mL/min (by C-G formula based on SCr of 0.89 mg/dL). Liver Function Tests: No results for input(s): AST, ALT, ALKPHOS, BILITOT, PROT, ALBUMIN in the last 168 hours. No results for input(s): LIPASE, AMYLASE in the last 168 hours. No results for input(s): AMMONIA in the last 168 hours. Coagulation Profile: No results for input(s): INR, PROTIME in the last 168 hours. Cardiac Enzymes: No results for input(s): CKTOTAL, CKMB, CKMBINDEX, TROPONINI in the last 168 hours. BNP (last 3 results) No results for input(s): PROBNP in the last 8760 hours. HbA1C: No results for input(s): HGBA1C in the last 72 hours. CBG: No results for input(s): GLUCAP in the last 168 hours. Lipid Profile: No results for input(s): CHOL, HDL, LDLCALC, TRIG, CHOLHDL, LDLDIRECT in the last 72 hours. Thyroid Function Tests: No results for input(s): TSH, T4TOTAL, FREET4, T3FREE, THYROIDAB in the last  72 hours. Anemia Panel: No results for input(s): VITAMINB12, FOLATE, FERRITIN, TIBC, IRON, RETICCTPCT in the last 72 hours. Sepsis Labs: No results for input(s): PROCALCITON, LATICACIDVEN in the last 168 hours.  Recent Results (from the past 240 hour(s))  MRSA Next Gen by PCR, Nasal     Status: None   Collection Time: 04/10/21  1:00 PM   Specimen: Nasal Mucosa; Nasal Swab  Result Value Ref Range Status   MRSA by PCR Next Gen NOT DETECTED NOT DETECTED Final    Comment: (NOTE) The GeneXpert MRSA Assay (FDA approved for NASAL specimens only), is one component of a comprehensive MRSA colonization surveillance program. It is not intended to diagnose MRSA infection nor to guide or monitor treatment for MRSA infections. Test performance is not FDA approved in patients less than 98 years old. Performed at [facility], [place] [address] [place], [place] [address]       Radiology Studies: CARDIAC CATHETERIZATION  Result Date: 04/11/2021 Conclusions: Severe native coronary artery disease, including chronic total occlusions of mid/distal LMCA, mid LAD (site of prior PCI), and ostial rPDA.  There is also irregular mid LAD disease beyond the occluded stent of up to 50%, sequential 30-70% proximal through distal LCx lesions, and sequential 80% proximal and 95% distal RCA stenoses. Widely patent LIMA-LAD. Patent SVG-ramus intermedius with occlusion of sequential portion of the graft to OM3.  SVG-ramus backfills the LCx and proximal LAD. Chronically occluded SVG-D1. Mildly elevated left ventricular filling pressure (LVEDP 18 mmHg). Recommendations: Images reviewed with Dr. Gardiner Rhyme.  Given lack of angina and minimal periinfarct ischemia on  recent stress test, medical management is preferred.  I doubt that PCI to proximal and distal RCA would significantly alter the patient's perioperative cardiac risk for planned fem-tib bypass tomorrow but could delay intervention aimed at treating his critical limb  ischemia. If Mr. Kudelka develops angina, PCI to proximal and distal RCA could be considered. Aggressive secondary prevention.  PCSK9 inhibitor should be considered as an outpatient, given history of statin intolerance. Nelva Bush, MD [facility]   Scheduled Meds:  aspirin EC  81 mg Oral Daily   enoxaparin (LOVENOX) injection  40 mg Subcutaneous Q24H   loratadine  10 mg Oral Daily   melatonin  5 mg Oral QHS   pantoprazole  40 mg Oral Daily   sodium chloride flush  3 mL Intravenous Q12H   tamsulosin  0.4 mg Oral QPC supper   Continuous Infusions:  sodium chloride 250 mL (04/12/21 0446)   levofloxacin (LEVAQUIN) IV 500 mg (04/11/21 1520)   metronidazole 500 mg (04/12/21 0449)     LOS: 11 days   Time spent: 28 minutes  Darliss Cheney, MD [facility]  04/12/2021, 10:14 AM  Please page via [place] [place] and do not message via secure chat for anything urgent. Secure chat can be used for anything non urgent.  How to contact the [facility] Attending or Consulting provider [place] or covering provider during after hours [place], for this patient?  Check the care team in [facility] and look for a) attending/consulting [facility] provider listed and b) the [facility] team listed. Page or secure chat [phone]. Log into [email or web address] and use [place]'s universal password to access. If you do not have the password, please contact the hospital operator. Locate the [facility] provider you are looking for under [facility] and page to a number that you can be directly reached. If you still have difficulty reaching the provider, please page the [facility] (Director on Call) for the Hospitalists listed on amion for assistance.

## 2021-04-12 NOTE — Progress Notes (Signed)
° °  Right LE PAD with ischemic skin loss right GT  Doppler signal brisk peroneal right LE Right groin incision without hematoma.  S/P fem-peroneal bypass with peroneal vein patch angioplasty and PTFE ringed bypass.    S/P post op disposition with improved arterial inflow to the right LE.  Roxy Horseman PA-C

## 2021-04-12 NOTE — Anesthesia Procedure Notes (Signed)
Arterial Line Insertion Start/End1/31/2023 12:30 PM Performed by: Minerva Ends, CRNA  Patient location: Pre-op. Preanesthetic checklist: patient identified, IV checked, site marked, risks and benefits discussed, surgical consent, monitors and equipment checked, pre-op evaluation, timeout performed and anesthesia consent Lidocaine 1% used for infiltration Right, radial was placed Catheter size: 20 G Hand hygiene performed , maximum sterile barriers used  and Seldinger technique used  Attempts: 1 Procedure performed without using ultrasound guided technique. Following insertion, dressing applied and Biopatch. Post procedure assessment: decreased circulation  Additional procedure comments: After placement of the arterial line, it was noticed that the patient's right thumb had become cold and white with no capillary refill.  This did not approved with repositioning and other methods, so the decision was made to remove A-line.  Thumb returned to normal color and temperature within 2 minutes of A-line removal.  Site without hematoma.

## 2021-04-12 NOTE — Interval H&P Note (Signed)
History and Physical Interval Note:  04/12/2021 1:09 PM  [name]  has presented today for surgery, with the diagnosis of Critical limb ischemia.  The various methods of treatment have been discussed with the patient and family. After consideration of risks, benefits and other options for treatment, the patient has consented to  Procedure(s): RIGHT FEMORAL- ANTERIOR TIBIAL ARTERY BYPASS (Right) INTRA OPERATIVE ARTERIOGRAM (Right) as a surgical intervention.  The patient's history has been reviewed, patient examined, no change in status, stable for surgery.  I have reviewed the patient's chart and labs.  Questions were answered to the patient's satisfaction.     Lemar Livings

## 2021-04-12 NOTE — Anesthesia Preprocedure Evaluation (Addendum)
Anesthesia Evaluation  Patient identified by MRN, date of birth, ID band Patient awake    Reviewed: Allergy & Precautions, NPO status , Patient's Chart, lab work & pertinent test results  History of Anesthesia Complications Negative for: history of anesthetic complications  Airway Mallampati: II  TM Distance: >3 FB Neck ROM: Full    Dental no notable dental hx. (+) Dental Advisory Given   Pulmonary neg pulmonary ROS,    breath sounds clear to auscultation       Cardiovascular hypertension, + CAD, + CABG and + Peripheral Vascular Disease  + dysrhythmias Atrial Fibrillation + pacemaker  Rhythm:Irregular Rate:Normal  Conclusions: 1. Severe native coronary artery disease, including chronic total occlusions of mid/distal LMCA, mid LAD (site of prior PCI), and ostial rPDA.  There is also irregular mid LAD disease beyond the occluded stent of up to 50%, sequential 30-70% proximal through distal LCx lesions, and sequential 80% proximal and 95% distal RCA stenoses. 2. Widely patent LIMA-LAD. 3. Patent SVG-ramus intermedius with occlusion of sequential portion of the graft to OM3.  SVG-ramus backfills the LCx and proximal LAD. 4. Chronically occluded SVG-D1. 5. Mildly elevated left ventricular filling pressure (LVEDP 18 mmHg).  Recommendations: 1. Images reviewed with Dr. Bjorn Pippin.  Given lack of angina and minimal periinfarct ischemia on recent stress test, medical management is preferred.  I doubt that PCI to proximal and distal RCA would significantly alter the patient's perioperative cardiac risk for planned fem-tib bypass tomorrow but could delay intervention aimed at treating his critical limb ischemia. 2. If [name] develops angina, PCI to proximal and distal RCA could be considered. 3. Aggressive secondary prevention.  PCSK9 inhibitor should be considered as an outpatient, given history of statin intolerance.  IMPRESSIONS    1.  Global mild to moderate hypokinesis with akinesis of the anterior,  anteroseptal wall and apex c/w LAD disease. Left ventricular ejection  fraction, by estimation, is 40 to 45%. The left ventricle has mildly  decreased function. The left ventricle  demonstrates regional wall motion abnormalities (see scoring  diagram/findings for description). There is mild concentric left  ventricular hypertrophy. Left ventricular diastolic parameters are  indeterminate.  2. Right ventricular systolic function was not well visualized. The right  ventricular size is not well visualized.  3. Left atrial size was moderately dilated.  4. Right atrium is dilated.  5. The mitral valve is grossly normal. No evidence of mitral valve  regurgitation.  6. The aortic valve is grossly normal. Aortic valve regurgitation is  mild. Aortic valve sclerosis/calcification is present, without any  evidence of aortic stenosis.  7. The inferior vena cava not well visualized.   Comparison(s): No prior Echocardiogram.    Neuro/Psych negative neurological ROS     GI/Hepatic negative GI ROS, Neg liver ROS,   Endo/Other  negative endocrine ROS  Renal/GU negative Renal ROS     Musculoskeletal  (+) Arthritis ,   Abdominal   Peds  Hematology negative hematology ROS (+)   Anesthesia Other Findings   Reproductive/Obstetrics                           Anesthesia Physical Anesthesia Plan  ASA: 3  Anesthesia Plan: General   Post-op Pain Management: Tylenol PO (pre-op) and Celebrex PO (pre-op)   Induction:   PONV Risk Score and Plan: 3 and Ondansetron, Dexamethasone and Treatment may vary due to age or medical condition  Airway Management Planned: Oral ETT  Additional Equipment:  Intra-op Plan:   Post-operative Plan: Extubation in OR  Informed Consent: I have reviewed the patients History and Physical, chart, labs and discussed the procedure including the risks, benefits and  alternatives for the proposed anesthesia with the patient or authorized representative who has indicated his/her understanding and acceptance.     Dental advisory given  Plan Discussed with: Anesthesiologist and CRNA  Anesthesia Plan Comments:       Anesthesia Quick Evaluation

## 2021-04-12 NOTE — Anesthesia Postprocedure Evaluation (Signed)
Anesthesia Post Note  Patient: [name]  Procedure(s) Performed: RIGHT FEMORAL- ANTERIOR TIBIAL ARTERY BYPASS (Right)     Patient location during evaluation: PACU Anesthesia Type: General Level of consciousness: sedated Pain management: pain level controlled Vital Signs Assessment: post-procedure vital signs reviewed and stable Respiratory status: spontaneous breathing and respiratory function stable Cardiovascular status: stable Postop Assessment: no apparent nausea or vomiting Anesthetic complications: no   No notable events documented.  Last Vitals:  Vitals:   04/12/21 1618 04/12/21 1633  BP: (!) 128/57 (!) 119/55  Pulse: 70 70  Resp: 11 15  Temp:  36.9 C  SpO2: 96% 96%    Last Pain:  Vitals:   04/12/21 1618  TempSrc:   PainSc: Asleep                 Jumana Paccione DANIEL

## 2021-04-13 ENCOUNTER — Other Ambulatory Visit (HOSPITAL_COMMUNITY): Payer: Self-pay

## 2021-04-13 ENCOUNTER — Encounter (HOSPITAL_COMMUNITY): Payer: Self-pay | Admitting: Vascular Surgery

## 2021-04-13 DIAGNOSIS — I70223 Atherosclerosis of native arteries of extremities with rest pain, bilateral legs: Secondary | ICD-10-CM | POA: Diagnosis not present

## 2021-04-13 DIAGNOSIS — I2510 Atherosclerotic heart disease of native coronary artery without angina pectoris: Secondary | ICD-10-CM | POA: Diagnosis not present

## 2021-04-13 DIAGNOSIS — Z950 Presence of cardiac pacemaker: Secondary | ICD-10-CM | POA: Diagnosis not present

## 2021-04-13 DIAGNOSIS — E782 Mixed hyperlipidemia: Secondary | ICD-10-CM | POA: Diagnosis not present

## 2021-04-13 LAB — LIPID PANEL
Cholesterol: 83 mg/dL (ref 0–200)
HDL: 27 mg/dL — ABNORMAL LOW (ref >40)
LDL Cholesterol: 48 mg/dL (ref 0–99)
Total CHOL/HDL Ratio: 3.1 RATIO
Triglycerides: 39 mg/dL (ref <150)
VLDL: 8 mg/dL (ref 0–40)

## 2021-04-13 LAB — BASIC METABOLIC PANEL
Anion gap: 7 (ref 5–15)
BUN: 21 mg/dL (ref 8–23)
CO2: 23 mmol/L (ref 22–32)
Calcium: 8.3 mg/dL — ABNORMAL LOW (ref 8.9–10.3)
Chloride: 107 mmol/L (ref 98–111)
Creatinine, Ser: 0.98 mg/dL (ref 0.61–1.24)
GFR, Estimated: >60 mL/min (ref >60)
Glucose, Bld: 149 mg/dL — ABNORMAL HIGH (ref 70–99)
Potassium: 4.1 mmol/L (ref 3.5–5.1)
Sodium: 137 mmol/L (ref 135–145)

## 2021-04-13 LAB — CBC
HCT: 33.8 % — ABNORMAL LOW (ref 39.0–52.0)
Hemoglobin: 11.6 g/dL — ABNORMAL LOW (ref 13.0–17.0)
MCH: 30.5 pg (ref 26.0–34.0)
MCHC: 34.3 g/dL (ref 30.0–36.0)
MCV: 88.9 fL (ref 80.0–100.0)
Platelets: 207 10*3/uL (ref 150–400)
RBC: 3.8 MIL/uL — ABNORMAL LOW (ref 4.22–5.81)
RDW: 11.6 % (ref 11.5–15.5)
WBC: 12 10*3/uL — ABNORMAL HIGH (ref 4.0–10.5)
nRBC: 0 % (ref 0.0–0.2)

## 2021-04-13 LAB — HEPATIC FUNCTION PANEL
ALT: 27 U/L (ref 0–44)
AST: 33 U/L (ref 15–41)
Albumin: 2.4 g/dL — ABNORMAL LOW (ref 3.5–5.0)
Alkaline Phosphatase: 47 U/L (ref 38–126)
Bilirubin, Direct: 0.1 mg/dL (ref 0.0–0.2)
Indirect Bilirubin: 0.3 mg/dL (ref 0.3–0.9)
Total Bilirubin: 0.4 mg/dL (ref 0.3–1.2)
Total Protein: 5 g/dL — ABNORMAL LOW (ref 6.5–8.1)

## 2021-04-13 MED ORDER — OXYCODONE HCL 5 MG PO TABS
5.0000 mg | ORAL_TABLET | Freq: Once | ORAL | Status: AC
Start: 2021-04-13 — End: 2021-04-13
  Administered 2021-04-13: 5 mg via ORAL
  Filled 2021-04-13: qty 1

## 2021-04-13 MED ORDER — CARVEDILOL 3.125 MG PO TABS
3.1250 mg | ORAL_TABLET | Freq: Two times a day (BID) | ORAL | Status: DC
  Administered 2021-04-13 – 2021-04-16 (×7): 3.125 mg via ORAL
  Filled 2021-04-13 (×7): qty 1

## 2021-04-13 NOTE — Progress Notes (Signed)
Occupational Therapy Re-Evaluation Patient Details Name: [name] MRN: [id number] DOB: 05/11/1933 Today's Date: 04/13/2021   History of present illness 88 y.o. male presents to ED from OP podietry with RLE wound and increased pain x 3-4 days. XR R foot (+) for soft tissue gas/infection with a gas-forming organism. R common femoral artery and RLE angiogram, LLE angiography 1/23 and Right common femoral to anterior tibial artery bypass 1/31. PMHx significant for CAD s/p CAGB 2006, ischemic cardiomyopathy with chronic systolic CHF, A-flutter s/p ablation ~2014, VT, and sinus syndrome s/p pacemaker placement.   OT comments  Session with focus on re-evaluation s/p surgical procedure listed above. At time of initial evaluation, patient was completing bed mobility with Mod I, functional transfers with Min A and LB ADLs with Mod A. Patient currently requiring increased external assist with bed mobility, sit to stand transfers, and ADL transfers secondary to generalized weakness/debility and RLE incisional pain. Patient currently functioning at Mod to Max A overall for self-care tasks and would benefit from AIR level therapies to maximize safety/independence with ADLs and decrease caregiver burden. Patient is highly motivated and has good family support reporting having several daughters that live close by. OT will continue to follow acutely.    Recommendations for follow up therapy are one component of a multi-disciplinary discharge planning process, led by the attending physician.  Recommendations may be updated based on patient status, additional functional criteria and insurance authorization.    Follow Up Recommendations  Acute inpatient rehab (3hours/day)    Assistance Recommended at Discharge Frequent or constant Supervision/Assistance  Patient can return home with the following  A little help with walking and/or transfers;Assistance with feeding;Assistance with cooking/housework;Assist for  transportation;Help with stairs or ramp for entrance;A lot of help with bathing/dressing/bathroom   Equipment Recommendations  Tub/shower seat    Recommendations for Other Services      Precautions / Restrictions Precautions Precautions: Fall Precaution Comments: R toe chronic wound - check for weeping, encouraged more heel WB during mobility w/ post-op shoe; stables to RLE Restrictions Weight Bearing Restrictions: No RLE Weight Bearing: Weight bearing as tolerated       Mobility Bed Mobility Overal bed mobility: Needs Assistance Bed Mobility: Supine to Sit     Supine to sit: HOB elevated, Min assist     General bed mobility comments: Able to advance BLE from bed surface to EOB. Min A to elevate trunk with HOB slightly elevated. Increased time/effort. Reports mild dizziness once seated EOB. BP WNL.    Transfers Overall transfer level: Needs assistance Equipment used: Rolling walker (2 wheels) Transfers: Sit to/from Stand, Bed to chair/wheelchair/BSC Sit to Stand: Mod assist Stand pivot transfers: Min assist         General transfer comment: Mod A for sit to stand with increased time to rise. Min A for stand-pivot to recliner on R. Posterior bias initially. Cues for upright posture. Limited by RLE pain.     Balance Overall balance assessment: Needs assistance Sitting-balance support: Feet supported, No upper extremity supported Sitting balance-Leahy Scale: Fair     Standing balance support: Single extremity supported, Bilateral upper extremity supported Standing balance-Leahy Scale: Poor Standing balance comment: Reliant on BUE support and external assist.                           ADL either performed or assessed with clinical judgement   ADL Overall ADL's : Needs assistance/impaired  Lower Body Dressing: Maximal assistance;Sitting/lateral leans Lower Body Dressing Details (indicate cue type and reason): Max A to don  footwear seated EOB. Toilet Transfer: Stand-pivot;Moderate assistance Toilet Transfer Details (indicate cue type and reason): Simulated with transfer to recliner. Mod A for sit to stand from slightly elevated EOB with cues for hand placement. Slow to rise. Knees/trunk in flexion upon standing requires cues for upright posture.           General ADL Comments: Limited by pain in RLE. First time up since surgery 1/31.    Extremity/Trunk Assessment              Vision       Perception     Praxis      Cognition Arousal/Alertness: Awake/alert Behavior During Therapy: WFL for tasks assessed/performed Overall Cognitive Status: Within Functional Limits for tasks assessed                                          Exercises      Shoulder Instructions       General Comments VSS on RA.    Pertinent Vitals/ Pain       Pain Assessment Pain Assessment: Faces Faces Pain Scale: Hurts even more Pain Location: R foot Pain Descriptors / Indicators: Throbbing, Sharp, Aching, Discomfort Pain Intervention(s): Limited activity within patient's tolerance, Monitored during session, Repositioned  Home Living                                          Prior Functioning/Environment              Frequency  Min 2X/week        Progress Toward Goals  OT Goals(current goals can now be found in the care plan section)  Progress towards OT goals:  (Limited by pain in RLE. Requires increased external assist for standing balance and functional transfers s/p surgical procedure.)  Acute Rehab OT Goals Patient Stated Goal: To get better. OT Goal Formulation: With patient Time For Goal Achievement: 04/18/21 Potential to Achieve Goals: Good ADL Goals Pt Will Perform Grooming: with supervision;standing Pt Will Perform Upper Body Dressing: with modified independence;sitting Pt Will Perform Lower Body Dressing: with supervision;sit to/from stand Pt Will  Transfer to Toilet: with modified independence;ambulating Pt Will Perform Toileting - Clothing Manipulation and hygiene: with modified independence;sit to/from stand  Plan Frequency remains appropriate;Discharge plan needs to be updated    Co-evaluation                 AM-PAC OT "6 Clicks" Daily Activity     Outcome Measure   Help from another person eating meals?: None Help from another person taking care of personal grooming?: A Little Help from another person toileting, which includes using toliet, bedpan, or urinal?: A Little Help from another person bathing (including washing, rinsing, drying)?: A Little Help from another person to put on and taking off regular upper body clothing?: A Little Help from another person to put on and taking off regular lower body clothing?: A Lot 6 Click Score: 18    End of Session Equipment Utilized During Treatment: Rolling walker (2 wheels)  OT Visit Diagnosis: Unsteadiness on feet (R26.81);Muscle weakness (generalized) (M62.81);History of falling (Z91.81);Pain Pain - Right/Left: Right Pain - part of body: Leg  Activity Tolerance Patient tolerated treatment well;Patient limited by pain   Patient Left with call bell/phone within reach;in chair;with chair alarm set   Nurse Communication Mobility status        Time: [phone] OT Time Calculation (min): 23 min  Charges: OT General Charges $OT Visit: 1 Visit OT Evaluation $OT Re-eval: 1 Re-eval OT Treatments $Therapeutic Activity: 8-22 mins  Amila Callies H. OTR/L Supplemental OT, Department of rehab services [phone]  Cadence Haslam R H. 04/13/2021, 9:52 AM

## 2021-04-13 NOTE — Progress Notes (Addendum)
°  Progress Note    04/13/2021 6:56 AM 1 Day Post-Op  Subjective:  c/o soreness on the outside of his leg; says oxycodone works the best for him; says his foot doesn't hurt now  Afebrile HR [id number] systolic [id number] RA  Vitals:   04/12/21 2353 04/13/21 0503  BP: 103/65 104/60  Pulse: 72 (!) 59  Resp: 19 19  Temp: 98.6 F (37 C) 97.8 F (36.6 C)  SpO2: 97% 95%    Physical Exam: Cardiac:  regular Lungs:   non labored Incisions:  right below knee incision is clean with staples in tact.  Right groin is clean and dry without hematoma Extremities:  brisk right DP/PT/peroneal doppler signals; right calf is soft and non tender.   CBC    Component Value Date/Time   WBC 12.0 (H) 04/13/2021 0212   RBC 3.80 (L) 04/13/2021 0212   HGB 11.6 (L) 04/13/2021 0212   HCT 33.8 (L) 04/13/2021 0212   PLT 207 04/13/2021 0212   MCV 88.9 04/13/2021 0212   MCH 30.5 04/13/2021 0212   MCHC 34.3 04/13/2021 0212   RDW 11.6 04/13/2021 0212   LYMPHSABS 0.9 03/31/2021 1351   MONOABS 0.4 03/31/2021 1351   EOSABS 0.0 03/31/2021 1351   BASOSABS 0.0 03/31/2021 1351    BMET    Component Value Date/Time   NA 137 04/13/2021 0212   NA 140 07/08/2019 0932   K 4.1 04/13/2021 0212   CL 107 04/13/2021 0212   CO2 23 04/13/2021 0212   GLUCOSE 149 (H) 04/13/2021 0212   BUN 21 04/13/2021 0212   BUN 21 07/08/2019 0932   CREATININE 0.98 04/13/2021 0212   CALCIUM 8.3 (L) 04/13/2021 0212   GFRNONAA >60 04/13/2021 0212   GFRAA 89 07/08/2019 0932    INR No results found for: INR   Intake/Output Summary (Last 24 hours) at 04/13/2021 0656 Last data filed at 04/13/2021 [id number] Gross per 24 hour  Intake 1139.31 ml  Output 1045 ml  Net 94.31 ml     Assessment/Plan:  88 y.o. male is s/p:  1.  Right common femoral to anterior tibial artery bypass with distal vein patch angioplasty with 6 mm ringed PTFE 2.  Harvest right greater saphenous vein  1 Day Post-Op   -pt doing well with patent bypass with  brisk doppler signals right DP/PT/peroneal.  Incisions look fine. -OOB mobilizing today -DVT prophylaxis:  Lovenox -discussed groin wound care with pt.   Leontine Locket, PA-C [facility] [phone] 04/13/2021 6:56 AM   I have independently interviewed and examined patient and agree with PA assessment and plan above.   Tymika Grilli C. Donzetta Matters, MD [facility] [place] Office: [phone] Pager: [phone]

## 2021-04-13 NOTE — Progress Notes (Addendum)
Progress Note  Patient Name: [name] Date of Encounter: 04/13/2021  [facility] Cardiologist: Norman Herrlich, MD   Subjective   S/p R common femoral to anterior tibial artery bypass yesterday.  No complications.  Denies any chest pain or dyspnea.  BP 116/62   Inpatient Medications    Scheduled Meds:  aspirin EC  81 mg Oral Daily   docusate sodium  100 mg Oral Daily   enoxaparin (LOVENOX) injection  40 mg Subcutaneous Q24H   loratadine  10 mg Oral Daily   melatonin  5 mg Oral QHS   pantoprazole  40 mg Oral Daily   sodium chloride flush  3 mL Intravenous Q12H   tamsulosin  0.4 mg Oral QPC supper   Continuous Infusions:  sodium chloride Stopped (04/12/21 1143)   sodium chloride     sodium chloride     levofloxacin (LEVAQUIN) IV 100 mL/hr at 04/13/21 8295   magnesium sulfate bolus IVPB     metronidazole Stopped (04/13/21 0616)   PRN Meds: sodium chloride, sodium chloride, acetaminophen, alum & mag hydroxide-simeth, bisacodyl, camphor-menthol, diphenhydrAMINE, guaiFENesin-dextromethorphan, hydrALAZINE, HYDROmorphone (DILAUDID) injection, hydrOXYzine, magnesium sulfate bolus IVPB, ondansetron (ZOFRAN) IV, oxyCODONE-acetaminophen, phenol, potassium chloride, senna-docusate, sodium chloride flush   Vital Signs    Vitals:   04/12/21 1650 04/12/21 1957 04/12/21 2353 04/13/21 0503  BP: 131/72 109/60 103/65 104/60  Pulse: 70 72 72 (!) 59  Resp: 14 13 19 19   Temp: 97.9 F (36.6 C) 98 F (36.7 C) 98.6 F (37 C) 97.8 F (36.6 C)  TempSrc: Oral Oral Oral Oral  SpO2: 98% 98% 97% 95%  Weight:    90.3 kg  Height:        Intake/Output Summary (Last 24 hours) at 04/13/2021 0910 Last data filed at 04/13/2021 0900 Gross per 24 hour  Intake 1142.31 ml  Output 1045 ml  Net 97.31 ml    Last 3 Weights 04/13/2021 04/11/2021 04/10/2021  Weight (lbs) 199 lb 1.2 oz 191 lb 12.8 oz 191 lb 9.3 oz  Weight (kg) 90.3 kg 87 kg 86.9 kg      Telemetry    Atrial paced, ventricular sensed  rhythm- Personally Reviewed  ECG    Atrial paced, ventricular sensed rhythm, RBBB plus LAFB, Q waves V1-V4- Personally Reviewed  Physical Exam  Overweight.  Generalized papular erythematous rash GEN: No acute distress.   Neck: No JVD Cardiac: RRR, no murmurs Respiratory: Clear to auscultation bilaterally. GI: Soft, nontender, non-distended  MS: No edema; No deformity. Neuro:  Nonfocal  Psych: Normal affect   Labs    High Sensitivity Troponin:  No results for input(s): TROPONINIHS in the last 720 hours.   Chemistry Recent Labs  Lab 04/11/21 0733 04/12/21 0320 04/12/21 1733 04/13/21 0212  NA 136 135  --  137  K 3.9 4.2  --  4.1  CL 104 105  --  107  CO2 21* 21*  --  23  GLUCOSE 102* 158*  --  149*  BUN 14 16  --  21  CREATININE 0.98 0.89 0.95 0.98  CALCIUM 8.5* 8.5*  --  8.3*  PROT  --   --   --  5.0*  ALBUMIN  --   --   --  2.4*  AST  --   --   --  33  ALT  --   --   --  27  ALKPHOS  --   --   --  47  BILITOT  --   --   --  0.4  GFRNONAA >60 >60 >60 >60  ANIONGAP 11 9  --  7     Lipids  Recent Labs  Lab 04/13/21 0212  CHOL 83  TRIG 39  HDL 27*  LDLCALC 48  CHOLHDL 3.1     Hematology Recent Labs  Lab 04/11/21 0733 04/12/21 0320 04/13/21 0212  WBC 10.7* 7.2 12.0*  RBC 4.69 4.34 3.80*  HGB 13.8 12.9* 11.6*  HCT 42.2 38.8* 33.8*  MCV 90.0 89.4 88.9  MCH 29.4 29.7 30.5  MCHC 32.7 33.2 34.3  RDW 11.9 11.6 11.6  PLT 217 207 207    Thyroid No results for input(s): TSH, FREET4 in the last 168 hours.  BNPNo results for input(s): BNP, PROBNP in the last 168 hours.  DDimer No results for input(s): DDIMER in the last 168 hours.   Radiology    CARDIAC CATHETERIZATION  Result Date: 04/11/2021 Conclusions: Severe native coronary artery disease, including chronic total occlusions of mid/distal LMCA, mid LAD (site of prior PCI), and ostial rPDA.  There is also irregular mid LAD disease beyond the occluded stent of up to 50%, sequential 30-70% proximal  through distal LCx lesions, and sequential 80% proximal and 95% distal RCA stenoses. Widely patent LIMA-LAD. Patent SVG-ramus intermedius with occlusion of sequential portion of the graft to OM3.  SVG-ramus backfills the LCx and proximal LAD. Chronically occluded SVG-D1. Mildly elevated left ventricular filling pressure (LVEDP 18 mmHg). Recommendations: Images reviewed with Dr. Bjorn PippinSchumann.  Given lack of angina and minimal periinfarct ischemia on recent stress test, medical management is preferred.  I doubt that PCI to proximal and distal RCA would significantly alter the patient's perioperative cardiac risk for planned fem-tib bypass tomorrow but could delay intervention aimed at treating his critical limb ischemia. If Mr. Manson PasseyBrown develops angina, PCI to proximal and distal RCA could be considered. Aggressive secondary prevention.  PCSK9 inhibitor should be considered as an outpatient, given history of statin intolerance. Yvonne Kendallhristopher End, MD [facility]   Cardiac Studies   2D echo 04/01/21   1. Global mild to moderate hypokinesis with akinesis of the anterior,  anteroseptal wall and apex c/w LAD disease. Left ventricular ejection  fraction, by estimation, is 40 to 45%. The left ventricle has mildly  decreased function. The left ventricle  demonstrates regional wall motion abnormalities (see scoring  diagram/findings for description). There is mild concentric left  ventricular hypertrophy. Left ventricular diastolic parameters are  indeterminate.   2. Right ventricular systolic function was not well visualized. The right  ventricular size is not well visualized.   3. Left atrial size was moderately dilated.   4. Right atrium is dilated.   5. The mitral valve is grossly normal. No evidence of mitral valve  regurgitation.   6. The aortic valve is grossly normal. Aortic valve regurgitation is  mild. Aortic valve sclerosis/calcification is present, without any  evidence of aortic stenosis.   7. The  inferior vena cava not well visualized.     Lexiscan Myoview 04/07/21:  IMPRESSION: 1. Large fixed defects are seen involving the anterior, apical and inferior myocardium consistent with old infarctions. Mild peri-infarct ischemia is noted anteriorly.   2. Moderate global hypomotility is noted.   3. Left ventricular ejection fraction 34%   4. Non invasive risk stratification*: High   Abdominal aortogram 04/04/21: Findings: Patient has tortuous external iliac arteries.  The common iliac and aorta are calcified.  Renal arteries without any stenoses.  Right SFA is flush occluded there is a large profunda giving  many collaterals.  There is an [place] of distal SFA.  He then reconstitutes intertibial artery which flows to the foot.  Posterior tibial artery appears to reconstitute but it occludes above the ankle.  On the left side his common femoral artery is calcified without any flow-limiting stenosis.  SFA is heavily calcified but patent down to the popliteal.  Trifurcation is patent.  All tibial vessels are heavily diseased with posterior tibial being the dominant runoff.  Patient will be considered for right femoral to anterior tibial artery bypass and vein mapping will be ordered.  Patient Profile     88 y.o. 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  Due to severity of PAD plan for right SFA to anterior tibial bypass surgery, with a request for preoperative cardiac clearance.  Assessment & Plan     CAD: s/p CABG/PCI (2006). He was admitted for cellulitis and abscess.  He had ABIs that were  abnormal and concerning for critical limb ischemia.  He had a PV angio that showed an occluded right SFA with many collaterals.  It reconstitutes in the intertibial artery and the tibial artery.  The tibial artery is occluded above the ankle.  He also has left common femoral artery and heavily calcified left SFA that was patent to the level of the popliteal.  He was recommended that he have a right femoral to anterior tibial bypass.  Given that his legs prohibit him from getting much exercise a Lexiscan Myoview was done which showed Large fixed defects are seen involving the anterior, apical and inferior myocardium consistent with old infarctions. Mild peri-infarct ischemia is noted anteriorly.  High risk study.  Echo with EF 40-45%.  LHC showed patent LIMA-LAD, patent SVG-ramus with occlusion of sequential portion of graft to OM 3, occluded SVG-D1, 80% proximal RCA stenosis, 95% distal RCA stenosis. -Discussed cath results with Dr. Okey Dupre.  Given lack of symptoms and not wanting to delay intervention for his critical limb ischemia, no PCI done.  If he does develop angina, could consider PCI to proximal and distal RCA. -Continue ASA.  Plan PCSK9 inhibitor evaluation as outpatient.  Chronic combined systolic and diastolic heart failure: EF 40 to 45% on echo 04/01/2021.  Will add low dose coreg, plan to add losartan tomorrow if BP tolerates.  Has intolerance listed to metoprolol, reaction was drop in HR/dizziness, but now has a pacemaker.  Discussed with pharmacy, OK to try coreg  PAD: Critical limb ischemia, S/p R common femoral to anterior tibial artery bypass 1/31  HLD: LDL 71, goal 55 given CAD, PAD as above.  He declined statins due to myalgias.  Will need outpatient evaluation for PCSK9 inhibitor   SSS: s/p PPM.     For questions or updates, please contact [facility] Please consult [email or web address] for contact info under        Signed, Little Ishikawa, MD  04/13/2021, 9:10 AM

## 2021-04-13 NOTE — Progress Notes (Signed)
PHARMACIST LIPID MONITORING   [name] is a 88 y.o. male admitted on 03/31/2021 with PVD.  Pharmacy has been consulted to optimize lipid-lowering therapy with the indication of secondary prevention for clinical ASCVD.  Recent Labs:  Lipid Panel (last 6 months):   Lab Results  Component Value Date   CHOL 83 04/13/2021   TRIG 39 04/13/2021   HDL 27 (L) 04/13/2021   CHOLHDL 3.1 04/13/2021   VLDL 8 04/13/2021   LDLCALC 48 04/13/2021    Hepatic function panel (last 6 months):   Lab Results  Component Value Date   AST 33 04/13/2021   ALT 27 04/13/2021   ALKPHOS 47 04/13/2021   BILITOT 0.4 04/13/2021   BILIDIR 0.1 04/13/2021   IBILI 0.3 04/13/2021    SCr (since admission):   Serum creatinine: 0.98 mg/dL 16/10/96 0454 Estimated creatinine clearance: 62.3 mL/min  Current therapy and lipid therapy tolerance Current lipid-lowering therapy: none Previous lipid-lowering therapies (if applicable): Zetia, myalgias with statins (uncertain which ones he has tried) Documented or reported allergies or intolerances to lipid-lowering therapies (if applicable): myalgias with statins   Plan:    1.Statin intensity (high intensity recommended for all patients regardless of the LDL):  No statin changes. The patient is already on a high intensity statin.  2.Add ezetimibe (if any one of the following):   Not indicated at this time.  3.Refer to lipid clinic:   Yes  4.Follow-up with:  Lipid clinic referral.  5.Follow-up labs after discharge:  No changes in lipid therapy, repeat a lipid panel in one year.     [name], PharmD Clinical Pharmacist **Pharmacist phone directory can now be found on [email or web address] ([email or web address]).  Listed under [facility].

## 2021-04-13 NOTE — Progress Notes (Signed)
PROGRESS NOTE    [name]  ZOX:[id number] DOB: 02/01/1934 DOA: 03/31/2021 PCP: Gordan PaymentGrisso, Greg A., MD   Brief Narrative:  88 y.o. male with medical history significant of coronary artery disease, ischemic cardiomyopathy with chronic systolic CHF with prior EF of 81%25%, history of CABG in 2006, a flutter status post ablation in 2014, VT, also sick sinus syndrome status post pacemaker.  He has been dealing with a right lower extremity callus for several weeks, seen as an outpatient by podiatry.  Over the last 3 to 4 days, he has noticed increased pain to the right lower extremity, increased swelling and redness.  He has a degree of neuropathy and does not feel much.  He was seen in podiatrist office and was sent to the hospital.  Vascular surgery as well as podiatry consulted, arteriogram shows significant right lower extremity arterial disease. Status post right common femoral to anterior tibial artery bypass with distal vein patch angioplasty on 04/12/2021.  Assessment & Plan:   Principal Problem:   Cellulitis and abscess of foot Active Problems:   Abnormal nuclear stress test   CAD (coronary artery disease) with history of CABG   Hx of CABG   Hyperlipidemia   Dizziness   Presence of cardiac pacemaker   Sick sinus syndrome (HCC) status post pacemaker   Chronic combined systolic and diastolic heart failure (HCC)   Macular degeneration   PAD (peripheral artery disease) (HCC)   Paroxysmal atrial flutter (HCC)   Urinary hesitancy   Critical limb ischemia of both lower extremities (HCC)   Rash   Abnormal nuclear cardiac imaging test  Cellulitis and abscess of foot/critical limb ischemia of both lower extremities/PAD- (present on admission) - Patient admitted with right foot cellulitis and abscess.  - Could not get an MRI due to pacemaker.  -initially: pus draining from right greater toe/Cellulitis extends up to mid upper shins.  -antibiotics-- IV vanc/zosyn initially, narrowed to rocephin  but patient developed an itching rash (unclear if related to new abx but timing coincides)- changed back to vanc/zosyn (also redness on IV rocephin seems to have worsened).  Due to no improvement in the rash, antibiotics were transitioned from zosyn/vanc to flagyl/levo.  -Blood cultures: no growth to date. Wound culture skin flora -Vascular surgery consulted, underwent arteriogram 1/23, Status post right common femoral to anterior tibial artery bypass with distal vein patch angioplasty on 04/12/2021. -Podiatry consulted as well:.  Bone scan does not show osteomyelitis.  No intervention recommended by podiatry.   Drug/morbilliform rash: Itching has resolved, rash receding.  Continue benadryl/H2/H1 blocker - abx changed as above   Urinary hesitancy - Possibly due to BPH.  This is been going on for quite some time, but most recently has noticed that he needs to urinate more often during the nighttime.  He is supposed to see urology outpatient - started on Flomax while here, no further issues, urinating well   Paroxysmal atrial flutter (HCC) - history of ablation, now has pacemaker.  Apparently he was on Coumadin in the past, and per care everywhere notes, in 2018, his Coumadin was stopped as he had no recurrence for A. fib or flutter for 1.5 years at that time.   Chronic systolic CHF (congestive heart failure) (HCC)/preoperative risk assessment/history of CAD s/p CABG/PCI -[facility], Dr. Sampson GoonFitzgerald, in 2018.  It was mentioned that the EF at that time was 40 to 45%, and recently had a stress test which was negative.  2D echo done here shows  EF 40-45% with mildly decreased LV function.  Suspect unchanged.  He has no heart failure symptoms, no angina.  Currently he is not on goal-directed therapy, not clear when he stopped the medications but back in 2018 he was on metoprolol 12.5 twice daily as well as aspirin.  Discussed with the daughter, he has self discontinued the medication as he  just does not like to take pills.  Currently appears stable.  Vascular surgery requested cardiac risk assessment.  Patient underwent LHC on 04/11/2021 which showed patent LIMA-LAD, patent SVG-ramus with occlusion of sequential portion of graft to OM 3, occluded SVG-D1, 80% proximal RCA stenosis, 95% distal RCA stenosis. Given lack of symptoms and not wanting to delay intervention for his critical limb ischemia, no PCI done.  If he does develop angina, could consider PCI to proximal and distal RCA.  He has been started on Coreg now.   Sick sinus syndrome (HCC) status post pacemaker- (present on admission) - Keep on medical telemetry.  He is paced   Dizziness -Chronic    Hyperlipidemia- (present on admission) - Has not been taking statin at home.  LDL at 71  Essential hypertension-resolved as of 04/06/2021 -Monitor blood pressure, currently not taking any medications, has remained within acceptable parameters   DVT prophylaxis: SCD's Start: 04/12/21 1705 enoxaparin (LOVENOX) injection 40 mg Start: 04/11/21 2200   Code Status: Full Code  Family Communication:  None present at bedside.  Plan of care discussed with patient in length and he/she verbalized understanding and agreed with it.  Status is: Inpatient  Remains inpatient appropriate because: Medically stable.  Needs discharged to CIR when bed is available and insurance authorization approved.  Estimated body mass index is 28.56 kg/m as calculated from the following:   Height as of this encounter: 5\' 10"  (1.778 m).   Weight as of this encounter: 90.3 kg.    Nutritional Assessment: Body mass index is 28.56 kg/m.Marland Kitchen Seen by dietician.  I agree with the assessment and plan as outlined below: Nutrition Status:        . Skin Assessment: I have examined the patient's skin and I agree with the wound assessment as performed by the wound care RN as outlined below:    Consultants:  Cardiology Vascular surgery  Procedures:  As  above  Antimicrobials:  Anti-infectives (From admission, onward)    Start     Dose/Rate Route Frequency Ordered Stop   04/10/21 1400  levofloxacin (LEVAQUIN) IVPB 500 mg        500 mg 100 mL/hr over 60 Minutes Intravenous Every 24 hours 04/10/21 1313     04/10/21 1345  metroNIDAZOLE (FLAGYL) IVPB 500 mg        500 mg 100 mL/hr over 60 Minutes Intravenous Every 12 hours 04/10/21 1246     04/10/21 0000  vancomycin (VANCOREADY) IVPB 1250 mg/250 mL  Status:  Discontinued        1,250 mg 166.7 mL/hr over 90 Minutes Intravenous Every 24 hours 04/09/21 0840 04/10/21 1246   04/09/21 1000  piperacillin-tazobactam (ZOSYN) IVPB 3.375 g  Status:  Discontinued        3.375 g 12.5 mL/hr over 240 Minutes Intravenous Every 8 hours 04/09/21 0840 04/10/21 1246   04/09/21 0930  vancomycin (VANCOREADY) IVPB 1250 mg/250 mL        1,250 mg 166.7 mL/hr over 90 Minutes Intravenous  Once 04/09/21 0810 04/09/21 1109   04/07/21 1400  cefTRIAXone (ROCEPHIN) 2 g in sodium chloride 0.9 % 100 mL IVPB  Status:  Discontinued        2 g 200 mL/hr over 30 Minutes Intravenous Every 24 hours 04/07/21 1035 04/09/21 0809   04/03/21 1800  vancomycin (VANCOREADY) IVPB 1250 mg/250 mL  Status:  Discontinued        1,250 mg 166.7 mL/hr over 90 Minutes Intravenous Every 24 hours 04/03/21 1455 04/07/21 1035   04/01/21 1830  vancomycin (VANCOREADY) IVPB 1750 mg/350 mL  Status:  Discontinued        1,750 mg 175 mL/hr over 120 Minutes Intravenous Every 24 hours 03/31/21 1632 04/03/21 1455   04/01/21 0630  clindamycin (CLEOCIN) IVPB 600 mg  Status:  Discontinued        600 mg 100 mL/hr over 30 Minutes Intravenous Every 8 hours 04/01/21 0540 04/04/21 1119   04/01/21 0000  piperacillin-tazobactam (ZOSYN) IVPB 3.375 g  Status:  Discontinued        3.375 g 12.5 mL/hr over 240 Minutes Intravenous Every 8 hours 03/31/21 1708 04/07/21 1035   03/31/21 2200  clindamycin (CLEOCIN) IVPB 600 mg  Status:  Discontinued        600 mg 100  mL/hr over 30 Minutes Intravenous Every 8 hours 03/31/21 1935 04/01/21 0540   03/31/21 1945  vancomycin (VANCOCIN) IVPB 1000 mg/200 mL premix  Status:  Discontinued        1,000 mg 200 mL/hr over 60 Minutes Intravenous  Once 03/31/21 1935 03/31/21 1944   03/31/21 1945  piperacillin-tazobactam (ZOSYN) IVPB 3.375 g  Status:  Discontinued        3.375 g 100 mL/hr over 30 Minutes Intravenous  Once 03/31/21 1935 03/31/21 1944   03/31/21 1630  vancomycin (VANCOREADY) IVPB 2000 mg/400 mL        2,000 mg 200 mL/hr over 120 Minutes Intravenous  Once 03/31/21 1619 03/31/21 1935   03/31/21 1600  vancomycin (VANCOREADY) IVPB 1750 mg/350 mL  Status:  Discontinued        1,750 mg 175 mL/hr over 120 Minutes Intravenous  Once 03/31/21 1541 03/31/21 1619   03/31/21 1545  piperacillin-tazobactam (ZOSYN) IVPB 3.375 g        3.375 g 100 mL/hr over 30 Minutes Intravenous  Once 03/31/21 1537 03/31/21 1633         Subjective: Patient seen and examined.  He was complaining of generalized weakness and some dizziness while working with OT otherwise no other complaint.  He is happy with his surgery going well.  Objective: Vitals:   04/13/21 0503 04/13/21 0916 04/13/21 0947 04/13/21 1232  BP: 104/60 116/62 127/89 (!) 106/59  Pulse: (!) 59 75 60 70  Resp: 19 14  10   Temp: 97.8 F (36.6 C) 97.8 F (36.6 C)  97.8 F (36.6 C)  TempSrc: Oral Oral  Oral  SpO2: 95% 95%  96%  Weight: 90.3 kg     Height:        Intake/Output Summary (Last 24 hours) at 04/13/2021 1247 Last data filed at 04/13/2021 1232 Gross per 24 hour  Intake 1342.31 ml  Output 1145 ml  Net 197.31 ml    Filed Weights   04/10/21 0500 04/11/21 0500 04/13/21 0503  Weight: 86.9 kg 87 kg 90.3 kg    Examination:  General exam: Appears calm and comfortable  Respiratory system: Clear to auscultation. Respiratory effort normal. Cardiovascular system: S1 & S2 heard, RRR. No JVD, murmurs, rubs, gallops or clicks. No pedal  edema. Gastrointestinal system: Abdomen is nondistended, soft and nontender. No organomegaly or masses felt. Normal bowel sounds  heard. Central nervous system: Alert and oriented. No focal neurological deficits. Extremities: Symmetric 5 x 5 power. Psychiatry: Judgement and insight appear normal. Mood & affect appropriate.   Data Reviewed: I have personally reviewed following labs and imaging studies  CBC: Recent Labs  Lab 04/08/21 0419 04/10/21 0245 04/11/21 0733 04/12/21 0320 04/13/21 0212  WBC 7.1 12.5* 10.7* 7.2 12.0*  HGB 13.5 14.1 13.8 12.9* 11.6*  HCT 39.6 43.9 42.2 38.8* 33.8*  MCV 89.4 91.8 90.0 89.4 88.9  PLT 184 210 217 207 207    Basic Metabolic Panel: Recent Labs  Lab 04/08/21 0419 04/10/21 0245 04/11/21 0733 04/12/21 0320 04/12/21 1733 04/13/21 0212  NA 137 136 136 135  --  137  K 3.4* 3.9 3.9 4.2  --  4.1  CL 107 107 104 105  --  107  CO2 22 20* 21* 21*  --  23  GLUCOSE 98 99 102* 158*  --  149*  BUN 10 13 14 16   --  21  CREATININE 0.81 0.94 0.98 0.89 0.95 0.98  CALCIUM 8.4* 8.4* 8.5* 8.5*  --  8.3*    GFR: Estimated Creatinine Clearance: 62.3 mL/min (by C-G formula based on SCr of 0.98 mg/dL). Liver Function Tests: Recent Labs  Lab 04/13/21 0212  AST 33  ALT 27  ALKPHOS 47  BILITOT 0.4  PROT 5.0*  ALBUMIN 2.4*   No results for input(s): LIPASE, AMYLASE in the last 168 hours. No results for input(s): AMMONIA in the last 168 hours. Coagulation Profile: No results for input(s): INR, PROTIME in the last 168 hours. Cardiac Enzymes: No results for input(s): CKTOTAL, CKMB, CKMBINDEX, TROPONINI in the last 168 hours. BNP (last 3 results) No results for input(s): PROBNP in the last 8760 hours. HbA1C: No results for input(s): HGBA1C in the last 72 hours. CBG: No results for input(s): GLUCAP in the last 168 hours. Lipid Profile: Recent Labs    04/13/21 0212  CHOL 83  HDL 27*  LDLCALC 48  TRIG 39  CHOLHDL 3.1   Thyroid Function  Tests: No results for input(s): TSH, T4TOTAL, FREET4, T3FREE, THYROIDAB in the last 72 hours. Anemia Panel: No results for input(s): VITAMINB12, FOLATE, FERRITIN, TIBC, IRON, RETICCTPCT in the last 72 hours. Sepsis Labs: No results for input(s): PROCALCITON, LATICACIDVEN in the last 168 hours.  Recent Results (from the past 240 hour(s))  MRSA Next Gen by PCR, Nasal     Status: None   Collection Time: 04/10/21  1:00 PM   Specimen: Nasal Mucosa; Nasal Swab  Result Value Ref Range Status   MRSA by PCR Next Gen NOT DETECTED NOT DETECTED Final    Comment: (NOTE) The GeneXpert MRSA Assay (FDA approved for NASAL specimens only), is one component of a comprehensive MRSA colonization surveillance program. It is not intended to diagnose MRSA infection nor to guide or monitor treatment for MRSA infections. Test performance is not FDA approved in patients less than 478 years old. Performed at [facility], [address] [place], [place] [address]       Radiology Studies: CARDIAC CATHETERIZATION  Result Date: 04/11/2021 Conclusions: Severe native coronary artery disease, including chronic total occlusions of mid/distal LMCA, mid LAD (site of prior PCI), and ostial rPDA.  There is also irregular mid LAD disease beyond the occluded stent of up to 50%, sequential 30-70% proximal through distal LCx lesions, and sequential 80% proximal and 95% distal RCA stenoses. Widely patent LIMA-LAD. Patent SVG-ramus intermedius with occlusion of sequential portion of the  graft to OM3.  SVG-ramus backfills the LCx and proximal LAD. Chronically occluded SVG-D1. Mildly elevated left ventricular filling pressure (LVEDP 18 mmHg). Recommendations: Images reviewed with Dr. Bjorn Pippin.  Given lack of angina and minimal periinfarct ischemia on recent stress test, medical management is preferred.  I doubt that PCI to proximal and distal RCA would significantly alter the patient's perioperative cardiac risk for planned  fem-tib bypass tomorrow but could delay intervention aimed at treating his critical limb ischemia. If Mr. Deloatch develops angina, PCI to proximal and distal RCA could be considered. Aggressive secondary prevention.  PCSK9 inhibitor should be considered as an outpatient, given history of statin intolerance. Yvonne Kendall, MD [facility]   Scheduled Meds:  aspirin EC  81 mg Oral Daily   carvedilol  3.125 mg Oral BID WC   docusate sodium  100 mg Oral Daily   enoxaparin (LOVENOX) injection  40 mg Subcutaneous Q24H   loratadine  10 mg Oral Daily   melatonin  5 mg Oral QHS   pantoprazole  40 mg Oral Daily   sodium chloride flush  3 mL Intravenous Q12H   tamsulosin  0.4 mg Oral QPC supper   Continuous Infusions:  sodium chloride Stopped (04/12/21 1143)   sodium chloride     sodium chloride     levofloxacin (LEVAQUIN) IV 100 mL/hr at 04/13/21 4166   magnesium sulfate bolus IVPB     metronidazole Stopped (04/13/21 0616)     LOS: 12 days   Time spent: 26 minutes  Hughie Closs, MD [facility]  04/13/2021, 12:47 PM  Please page via Loretha Stapler and do not message via secure chat for anything urgent. Secure chat can be used for anything non urgent.  How to contact the [facility] Attending or Consulting provider 7A - 7P or covering provider during after hours 7P -7A, for this patient?  Check the care team in [facility] and look for a) attending/consulting [facility] provider listed and b) the [facility] team listed. Page or secure chat 7A-7P. Log into [email or web address] and use [place]'s universal password to access. If you do not have the password, please contact the hospital operator. Locate the [facility] provider you are looking for under [facility] and page to a number that you can be directly reached. If you still have difficulty reaching the provider, please page the [facility] (Director on Call) for the Hospitalists listed on amion for assistance.

## 2021-04-13 NOTE — TOC Benefit Eligibility Note (Signed)
Patient Advocate Encounter  Insurance verification completed.    The patient is currently admitted and upon discharge could be taking Entresto 24-26 mg.  The current 30 day co-pay is, $47.00.   The patient is currently admitted and upon discharge could be taking Farxiga 10 mg.  The current 30 day co-pay is, $47.00.   The patient is currently admitted and upon discharge could be taking Jardiance 10 mg.  The current 30 day co-pay is, $47.00.   The patient is insured through [employer] Medicare Part D    Gwendy Boeder, CPhT Pharmacy Patient Advocate Specialist [facility] Patient Advocate Team Direct Number: [phone]  Fax: [phone]        

## 2021-04-13 NOTE — Evaluation (Signed)
Physical Therapy Re-Evaluation Patient Details Name: [name] MRN: [id number] DOB: Aug 03, 1933 Today's Date: 04/13/2021  History of Present Illness  88 y.o. male presents 03/31/21 to ED from OP podietry with RLE wound and increased pain x 3-4 days. XR R foot (+) for soft tissue gas/infection with a gas-forming organism. R common femoral artery and RLE angiogram, LLE angiography 1/23 and Right common femoral to anterior tibial artery bypass 1/31. PMHx significant for CAD s/p CAGB 2006, ischemic cardiomyopathy with chronic systolic CHF, A-flutter s/p ablation ~2014, VT, and sinus syndrome s/p pacemaker placement.   Clinical Impression  Pt now s/p R common femoral to anterior tibial artery bypass 1/31. PTA, he was using a cane for mobility. Pt now requiring minA for transfers and bedroom distance gait bouts using a RW due to deficits in strength and weight bearing tolerance in his R leg secondary to pain. Pt was progressing to performing transfers at a SUP level prior to recent surgery. Pt is highly motivated to improve and would benefit from follow-up with intensive therapies in the AIR setting to maximize his return to baseline. Updated recs and goals appropriately. Will continue to follow acutely.       Recommendations for follow up therapy are one component of a multi-disciplinary discharge planning process, led by the attending physician.  Recommendations may be updated based on patient status, additional functional criteria and insurance authorization.  Follow Up Recommendations Acute inpatient rehab (3hours/day)    Assistance Recommended at Discharge Intermittent Supervision/Assistance  Patient can return home with the following  A little help with walking and/or transfers;A little help with bathing/dressing/bathroom;Assistance with cooking/housework;Assist for transportation;Help with stairs or ramp for entrance    Equipment Recommendations Rolling walker (2 wheels);BSC/3in1   Recommendations for Other Services  Rehab consult    Functional Status Assessment       Precautions / Restrictions Precautions Precautions: Fall Precaution Comments: R toe chronic wound - check for weeping, encouraged more heel WB during mobility w/ post-op shoe; staples to RLE Required Braces or Orthoses:  (pos-op shoe in room) Restrictions Weight Bearing Restrictions: No RLE Weight Bearing: Weight bearing as tolerated      Mobility  Bed Mobility Overal bed mobility: Needs Assistance Bed Mobility: Sit to Supine       Sit to supine: Min assist   General bed mobility comments: MinA to lift legs onto bed with transition sit > supine.    Transfers Overall transfer level: Needs assistance Equipment used: Rolling walker (2 wheels) Transfers: Sit to/from Stand Sit to Stand: Min assist           General transfer comment: Cues provided to scoot to edge of chair and push up with hands on arm rests of chair. x10 mini push ups to warm-up prior to coming to stand, minA to power up and extend hips to come to full upright standing from recliner. x3 additional mini initiation of sit to stands from EOB with min guard assist, not coming to full stand either of the 3x.    Ambulation/Gait Ambulation/Gait assistance: Min assist Gait Distance (Feet): 20 Feet Assistive device: Rolling walker (2 wheels) Gait Pattern/deviations: Decreased stride length, Trunk flexed, Step-to pattern, Antalgic, Decreased weight shift to right, Decreased stance time - right Gait velocity: reduced Gait velocity interpretation: <1.31 ft/sec, indicative of household ambulator   General Gait Details: Pt with very slow, antalgic gait with decreased R weight shift and stance time. Pt able to take a few steps anteriorly then a few posteriorly repeatedly.  Cues provided to bring RW posterior every other step as he tends to leave it and keep stepping posteriorly.  Stairs            Wheelchair Mobility     Modified Rankin (Stroke Patients Only)       Balance Overall balance assessment: Needs assistance Sitting-balance support: Feet supported, No upper extremity supported Sitting balance-Leahy Scale: Fair     Standing balance support: Bilateral upper extremity supported, During functional activity Standing balance-Leahy Scale: Poor Standing balance comment: Reliant on Bil UE support and external assist.                             Pertinent Vitals/Pain Pain Assessment Pain Assessment: Faces Faces Pain Scale: Hurts even more Pain Location: R foot/leg Pain Descriptors / Indicators: Throbbing, Aching, Discomfort, Operative site guarding Pain Intervention(s): Limited activity within patient's tolerance, Monitored during session, Repositioned    Home Living Family/patient expects to be discharged to:: Private residence Living Arrangements: Alone Available Help at Discharge: Family;Available PRN/intermittently Type of Home: House Home Access: Stairs to enter Entrance Stairs-Rails: Can reach both Entrance Stairs-Number of Steps: 8   Home Layout: One level Home Equipment: Cane - single point;Standard Walker;Grab bars - tub/shower      Prior Function Prior Level of Function : Needs assist             Mobility Comments: pt reports using cane for stability, requires increased time and use of handrails to climb stairs into his home ADLs Comments: family orders groceries for delivery, and provide transportation to doctor's appointments, and assist for bill paying     Hand Dominance        Extremity/Trunk Assessment   Upper Extremity Assessment Upper Extremity Assessment: Defer to OT evaluation    Lower Extremity Assessment Lower Extremity Assessment: RLE deficits/detail RLE Deficits / Details: erythema noted R foot and calf with edema; noted generalized weakness with functional mobility; ulcer on R big toe and stables anterior lower leg with some minor  weeping       Communication      Cognition Arousal/Alertness: Awake/alert Behavior During Therapy: WFL for tasks assessed/performed Overall Cognitive Status: Within Functional Limits for tasks assessed                                          General Comments General comments (skin integrity, edema, etc.): VSS on RA.    Exercises General Exercises - Lower Extremity Long Arc Quad: AROM, Strengthening, Both, 10 reps, Seated Other Exercises Other Exercises: x10 push-ups in recliner with use of legs to initiate transfers, x3 additional reps at EOB Other Exercises: encouraged elevating R leg and performing ankle pumps to manage edema   Assessment/Plan    PT Assessment    PT Problem List         PT Treatment Interventions      PT Goals (Current goals can be found in the Care Plan section)  Acute Rehab PT Goals Patient Stated Goal: to improve PT Goal Formulation: With patient Time For Goal Achievement: 04/27/21 Potential to Achieve Goals: Good    Frequency Min 3X/week     Co-evaluation               AM-PAC PT "6 Clicks" Mobility  Outcome Measure Help needed turning from your back to your side while in  a flat bed without using bedrails?: A Little Help needed moving from lying on your back to sitting on the side of a flat bed without using bedrails?: A Little Help needed moving to and from a bed to a chair (including a wheelchair)?: A Little Help needed standing up from a chair using your arms (e.g., wheelchair or bedside chair)?: A Little Help needed to walk in hospital room?: A Little Help needed climbing 3-5 steps with a railing? : A Lot 6 Click Score: 17    End of Session Equipment Utilized During Treatment: Gait belt Activity Tolerance: Patient limited by pain;Patient tolerated treatment well Patient left: in bed;with call bell/phone within reach;with bed alarm set Nurse Communication: Mobility status;Other (comment) (surgical site weeping,  raw buttocks) PT Visit Diagnosis: Other abnormalities of gait and mobility (R26.89);Difficulty in walking, not elsewhere classified (R26.2);Pain;Unsteadiness on feet (R26.81);Muscle weakness (generalized) (M62.81) Pain - Right/Left: Right Pain - part of body: Leg    Time: 1157-1220 PT Time Calculation (min) (ACUTE ONLY): 23 min   Charges:   PT Evaluation $PT Re-evaluation: 1 Re-eval PT Treatments $Therapeutic Activity: 8-22 mins        Moishe Spice, PT, DPT Acute Rehabilitation Services  Pager: [phone] Office: [place] 04/13/2021, 12:40 PM

## 2021-04-13 NOTE — Progress Notes (Signed)
Inpatient Rehab Admissions Coordinator:   Per therapy recommendation patient was screened for CIR candidacy by Megan Salon, MS, CCC-SLP. Pt.'s payor, [facility] Medicare is unlikely approve CIR for Pt.'s diagnosis, so I will not pursue CIR admit for this Pt. If medical team feels strongly that we should pursue CIR, acute MD may place CIR consult.  Please contact me any with questions.  Megan Salon, MS, CCC-SLP Rehab Admissions Coordinator  [phone] (celll) [phone] (office)

## 2021-04-13 NOTE — Discharge Instructions (Signed)
 [facility] [place] [facility] [facility]  Discharge instructions  Lower Extremity Bypass Surgery  Please refer to the following instruction for your post-procedure care. Your surgeon or physician assistant will discuss any changes with you.  Activity  You are encouraged to walk as much as you can. You can slowly return to normal activities during the month after your surgery. Avoid strenuous activity and heavy lifting until your doctor tells you it's OK. Avoid activities such as vacuuming or swinging a golf club. Do not drive until your doctor give the OK and you are no longer taking prescription pain medications. It is also normal to have difficulty with sleep habits, eating and bowel movement after surgery. These will go away with time.  Bathing/Showering  Shower daily after you go home. Do not soak in a bathtub, hot tub, or swim until the incision heals completely.  Incision Care  Clean your incision with mild soap and water. Shower every day. Pat the area dry with a clean towel. You do not need a bandage unless otherwise instructed. Do not apply any ointments or creams to your incision. If you have open wounds you will be instructed how to care for them or a visiting nurse may be arranged for you. If you have staples or sutures along your incision they will be removed at your post-op appointment. You may have skin glue on your incision. Do not peel it off. It will come off on its own in about one week.  Wash the groin wound with soap and water daily and pat dry. (No tub bath-only shower)  Then put a dry gauze or washcloth in the groin to keep this area dry to help prevent wound infection.  Do this daily and as needed.  Do not use Vaseline or neosporin on your incisions.  Only use soap and water on your incisions and then protect and keep dry.  Diet  Resume your normal diet. There are no special food restrictions following this procedure. A low fat/ low cholesterol diet is  recommended for all patients with vascular disease. In order to heal from your surgery, it is CRITICAL to get adequate nutrition. Your body requires vitamins, minerals, and protein. Vegetables are the best source of vitamins and minerals. Vegetables also provide the perfect balance of protein. Processed food has little nutritional value, so try to avoid this.  Medications  Resume taking all your medications unless your doctor or physician assistant tells you not to. If your incision is causing pain, you may take over-the-counter pain relievers such as acetaminophen (Tylenol). If you were prescribed a stronger pain medication, please aware these medication can cause nausea and constipation. Prevent nausea by taking the medication with a snack or meal. Avoid constipation by drinking plenty of fluids and eating foods with high amount of fiber, such as fruits, vegetables, and grains. Take Colace 100 mg (an over-the-counter stool softener) twice a day as needed for constipation.  Do not take Tylenol if you are taking prescription pain medications.  Follow Up  Our office will schedule a follow up appointment 2-3 weeks following discharge.  Please call us immediately for any of the following conditions  Severe or worsening pain in your legs or feet while at rest or while walking Increase pain, redness, warmth, or drainage (pus) from your incision site(s) Fever of 101 degree or higher The swelling in your leg with the bypass suddenly worsens and becomes more painful than when you were in the hospital If you have   been instructed to feel your graft pulse then you should do so every day. If you can no longer feel this pulse, call the office immediately. Not all patients are given this instruction.  Leg swelling is common after leg bypass surgery.  The swelling should improve over a few months following surgery. To improve the swelling, you may elevate your legs above the level of your heart while you are  sitting or resting. Your surgeon or physician assistant may ask you to apply an ACE wrap or wear compression (TED) stockings to help to reduce swelling.  Reduce your risk of vascular disease  Stop smoking. If you would like help call [facility] at 1-800-QUIT-NOW (1-800-784-8669) or [facility] at 336-586-4000.  Manage your cholesterol Maintain a desired weight Control your diabetes weight Control your diabetes Keep your blood pressure down  If you have any questions, please call the office at [phone]  

## 2021-04-14 LAB — BASIC METABOLIC PANEL
Anion gap: 5 (ref 5–15)
BUN: 19 mg/dL (ref 8–23)
CO2: 25 mmol/L (ref 22–32)
Calcium: 8.2 mg/dL — ABNORMAL LOW (ref 8.9–10.3)
Chloride: 107 mmol/L (ref 98–111)
Creatinine, Ser: 0.95 mg/dL (ref 0.61–1.24)
GFR, Estimated: >60 mL/min (ref >60)
Glucose, Bld: 106 mg/dL — ABNORMAL HIGH (ref 70–99)
Potassium: 3.7 mmol/L (ref 3.5–5.1)
Sodium: 137 mmol/L (ref 135–145)

## 2021-04-14 LAB — CBC
HCT: 33.2 % — ABNORMAL LOW (ref 39.0–52.0)
Hemoglobin: 11.1 g/dL — ABNORMAL LOW (ref 13.0–17.0)
MCH: 30.2 pg (ref 26.0–34.0)
MCHC: 33.4 g/dL (ref 30.0–36.0)
MCV: 90.5 fL (ref 80.0–100.0)
Platelets: 186 10*3/uL (ref 150–400)
RBC: 3.67 MIL/uL — ABNORMAL LOW (ref 4.22–5.81)
RDW: 11.8 % (ref 11.5–15.5)
WBC: 6.5 10*3/uL (ref 4.0–10.5)
nRBC: 0 % (ref 0.0–0.2)

## 2021-04-14 MED ORDER — METRONIDAZOLE 500 MG PO TABS
500.0000 mg | ORAL_TABLET | Freq: Two times a day (BID) | ORAL | Status: DC
  Administered 2021-04-14 – 2021-04-16 (×4): 500 mg via ORAL
  Filled 2021-04-14 (×4): qty 1

## 2021-04-14 MED ORDER — LOSARTAN POTASSIUM 25 MG PO TABS
25.0000 mg | ORAL_TABLET | Freq: Every day | ORAL | Status: DC
  Administered 2021-04-14 – 2021-04-16 (×3): 25 mg via ORAL
  Filled 2021-04-14 (×3): qty 1

## 2021-04-14 MED ORDER — LEVOFLOXACIN 750 MG PO TABS
750.0000 mg | ORAL_TABLET | Freq: Every day | ORAL | Status: AC
  Administered 2021-04-14 – 2021-04-16 (×3): 750 mg via ORAL
  Filled 2021-04-14 (×3): qty 1

## 2021-04-14 NOTE — Progress Notes (Signed)
IP rehab admissions - I met with patient this am.  He prefers to go home with [facility] therapies once his pain is better and he is mobilizing better.  I did talk with him about rehab options and I gave him rehab booklets.  He is okay with me opening the case with [facility] medicare and asking for rehab admission.  I have opened the case and have sent clinicals to insurance.  It will take about 3 days to hear back from insurance case manager.  I do anticipate getting a denial from insurance for CIR.  I will follow up once I hear back from insurance.  Call for questions.  [phone]

## 2021-04-14 NOTE — Progress Notes (Signed)
Progress Note  Patient Name: [name] Date of Encounter: 04/14/2021  [facility] Cardiologist: Norman Herrlich, MD   Subjective   BP 113/66.  Creatinine stable at 0.95.  Denies any chest pain or dyspnea.   Inpatient Medications    Scheduled Meds:  aspirin EC  81 mg Oral Daily   carvedilol  3.125 mg Oral BID WC   docusate sodium  100 mg Oral Daily   enoxaparin (LOVENOX) injection  40 mg Subcutaneous Q24H   loratadine  10 mg Oral Daily   melatonin  5 mg Oral QHS   pantoprazole  40 mg Oral Daily   sodium chloride flush  3 mL Intravenous Q12H   tamsulosin  0.4 mg Oral QPC supper   Continuous Infusions:  sodium chloride Stopped (04/12/21 1143)   sodium chloride     sodium chloride     levofloxacin (LEVAQUIN) IV 500 mg (04/13/21 1351)   magnesium sulfate bolus IVPB     metronidazole Stopped (04/14/21 2992)   PRN Meds: sodium chloride, sodium chloride, acetaminophen, alum & mag hydroxide-simeth, bisacodyl, camphor-menthol, diphenhydrAMINE, guaiFENesin-dextromethorphan, hydrALAZINE, HYDROmorphone (DILAUDID) injection, hydrOXYzine, magnesium sulfate bolus IVPB, ondansetron (ZOFRAN) IV, oxyCODONE-acetaminophen, phenol, potassium chloride, senna-docusate, sodium chloride flush   Vital Signs    Vitals:   04/14/21 0038 04/14/21 0536 04/14/21 0559 04/14/21 0747  BP: 110/62 110/63  113/66  Pulse: 63 60 66 65  Resp: 18 16 16 18   Temp: 98.5 F (36.9 C) 97.9 F (36.6 C)  98 F (36.7 C)  TempSrc: Oral Oral  Oral  SpO2: 98% 99% 97% 97%  Weight:   89.8 kg   Height:        Intake/Output Summary (Last 24 hours) at 04/14/2021 0811 Last data filed at 04/14/2021 0749 Gross per 24 hour  Intake 943 ml  Output 1350 ml  Net -407 ml    Last 3 Weights 04/14/2021 04/13/2021 04/11/2021  Weight (lbs) 198 lb 199 lb 1.2 oz 191 lb 12.8 oz  Weight (kg) 89.812 kg 90.3 kg 87 kg      Telemetry    Atrial paced, ventricular sensed rhythm- Personally Reviewed  ECG    Atrial paced,  ventricular sensed rhythm, RBBB plus LAFB, Q waves V1-V4- Personally Reviewed  Physical Exam  Overweight.  Rash significantly improved GEN: No acute distress.   Neck: No JVD Cardiac: RRR, no murmurs Respiratory: Clear to auscultation bilaterally. GI: Soft, nontender, non-distended  MS: No edema; No deformity. Neuro:  Nonfocal  Psych: Normal affect   Labs    High Sensitivity Troponin:  No results for input(s): TROPONINIHS in the last 720 hours.   Chemistry Recent Labs  Lab 04/12/21 0320 04/12/21 1733 04/13/21 0212 04/14/21 0325  NA 135  --  137 137  K 4.2  --  4.1 3.7  CL 105  --  107 107  CO2 21*  --  23 25  GLUCOSE 158*  --  149* 106*  BUN 16  --  21 19  CREATININE 0.89 0.95 0.98 0.95  CALCIUM 8.5*  --  8.3* 8.2*  PROT  --   --  5.0*  --   ALBUMIN  --   --  2.4*  --   AST  --   --  33  --   ALT  --   --  27  --   ALKPHOS  --   --  47  --   BILITOT  --   --  0.4  --   GFRNONAA >60 >  60 >60 >60  ANIONGAP 9  --  7 5     Lipids  Recent Labs  Lab 04/13/21 0212  CHOL 83  TRIG 39  HDL 27*  LDLCALC 48  CHOLHDL 3.1     Hematology Recent Labs  Lab 04/12/21 0320 04/13/21 0212 04/14/21 0325  WBC 7.2 12.0* 6.5  RBC 4.34 3.80* 3.67*  HGB 12.9* 11.6* 11.1*  HCT 38.8* 33.8* 33.2*  MCV 89.4 88.9 90.5  MCH 29.7 30.5 30.2  MCHC 33.2 34.3 33.4  RDW 11.6 11.6 11.8  PLT 207 207 186    Thyroid No results for input(s): TSH, FREET4 in the last 168 hours.  BNPNo results for input(s): BNP, PROBNP in the last 168 hours.  DDimer No results for input(s): DDIMER in the last 168 hours.   Radiology    No results found.  Cardiac Studies   2D echo 04/01/21   1. Global mild to moderate hypokinesis with akinesis of the anterior,  anteroseptal wall and apex c/w LAD disease. Left ventricular ejection  fraction, by estimation, is 40 to 45%. The left ventricle has mildly  decreased function. The left ventricle  demonstrates regional wall motion abnormalities (see scoring   diagram/findings for description). There is mild concentric left  ventricular hypertrophy. Left ventricular diastolic parameters are  indeterminate.   2. Right ventricular systolic function was not well visualized. The right  ventricular size is not well visualized.   3. Left atrial size was moderately dilated.   4. Right atrium is dilated.   5. The mitral valve is grossly normal. No evidence of mitral valve  regurgitation.   6. The aortic valve is grossly normal. Aortic valve regurgitation is  mild. Aortic valve sclerosis/calcification is present, without any  evidence of aortic stenosis.   7. The inferior vena cava not well visualized.     Lexiscan Myoview 04/07/21:  IMPRESSION: 1. Large fixed defects are seen involving the anterior, apical and inferior myocardium consistent with old infarctions. Mild peri-infarct ischemia is noted anteriorly.   2. Moderate global hypomotility is noted.   3. Left ventricular ejection fraction 34%   4. Non invasive risk stratification*: High   Abdominal aortogram 04/04/21: Findings: Patient has tortuous external iliac arteries.  The common iliac and aorta are calcified.  Renal arteries without any stenoses.  Right SFA is flush occluded there is a large profunda giving many collaterals.  There is an [place] of distal SFA.  He then reconstitutes intertibial artery which flows to the foot.  Posterior tibial artery appears to reconstitute but it occludes above the ankle.  On the left side his common femoral artery is calcified without any flow-limiting stenosis.  SFA is heavily calcified but patent down to the popliteal.  Trifurcation is patent.  All tibial vessels are heavily diseased with posterior tibial being the dominant runoff.  Patient will be considered for right femoral to anterior tibial artery bypass and vein mapping will be ordered.  Patient Profile     88 y.o. male with critical limb ischemia both lower extremities and right foot  cellulitis/abscess, history of CAD with remote CABG (2006) chronic combined systolic and diastolic heart failure, sick sinus syndrome and bifascicular block status post Biotronik dual-chamber pacemaker, history of nonsustained VT and PVCs, history of atrial flutter status post previous ablation, hypertension, hyperlipidemia with reported myopathy-related statin intolerance.  Due to severity of PAD plan for right SFA to anterior tibial bypass surgery, with a request for preoperative cardiac clearance.  Assessment & Plan  CAD: s/p CABG/PCI (2006). He was admitted for cellulitis and abscess.  He had ABIs that were abnormal and concerning for critical limb ischemia.  He had a PV angio that showed an occluded right SFA with many collaterals.  It reconstitutes in the intertibial artery and the tibial artery.  The tibial artery is occluded above the ankle.  He also has left common femoral artery and heavily calcified left SFA that was patent to the level of the popliteal.  He was recommended that he have a right femoral to anterior tibial bypass.  Given that his legs prohibit him from getting much exercise a Lexiscan Myoview was done which showed Large fixed defects are seen involving the anterior, apical and inferior myocardium consistent with old infarctions. Mild peri-infarct ischemia is noted anteriorly.  High risk study.  Echo with EF 40-45%.  LHC showed patent LIMA-LAD, patent SVG-ramus with occlusion of sequential portion of graft to OM 3, occluded SVG-D1, 80% proximal RCA stenosis, 95% distal RCA stenosis. -Discussed cath results with Dr. Okey Dupre.  Given lack of symptoms and not wanting to delay intervention for his critical limb ischemia, no PCI done.  If he does develop angina, could consider PCI to proximal and distal RCA. -Continue ASA.  Plan PCSK9 inhibitor evaluation as outpatient.  Chronic combined systolic and diastolic heart failure: EF 40 to 45% on echo 04/01/2021.  Will add low dose coreg, plan to  add losartan tomorrow if BP tolerates.  Has intolerance listed to metoprolol, reaction was drop in HR/dizziness, but now has a pacemaker.  Tolerating carvedilol.  Will add losartan 25 mg daily.  PAD: Critical limb ischemia, S/p R common femoral to anterior tibial artery bypass 1/31  HLD: LDL 71, goal 55 given CAD, PAD as above.  He declined statins due to myalgias.  Will need outpatient evaluation for PCSK9 inhibitor   SSS: s/p PPM.     For questions or updates, please contact [facility] Please consult [email or web address] for contact info under        Signed, Little Ishikawa, MD  04/14/2021, 8:11 AM

## 2021-04-14 NOTE — Progress Notes (Signed)
°  Progress Note    04/14/2021 9:14 AM 2 Days Post-Op  Subjective: No overnight issue  Vitals:   04/14/21 0559 04/14/21 0747  BP:  113/66  Pulse: 66 65  Resp: 16 18  Temp:  98 F (36.7 C)  SpO2: 97% 97%    Physical Exam: Awake alert oriented Nonlabored respirations Right groin is soft Below-knee incision healing well with staples Strong right AT pulse can be traced to the first web interspace     Component Value Date/Time   WBC 6.5 04/14/2021 0325   RBC 3.67 (L) 04/14/2021 0325   HGB 11.1 (L) 04/14/2021 0325   HCT 33.2 (L) 04/14/2021 0325   PLT 186 04/14/2021 0325   MCV 90.5 04/14/2021 0325   MCH 30.2 04/14/2021 0325   MCHC 33.4 04/14/2021 0325   RDW 11.8 04/14/2021 0325   LYMPHSABS 0.9 03/31/2021 1351   MONOABS 0.4 03/31/2021 1351   EOSABS 0.0 03/31/2021 1351   BASOSABS 0.0 03/31/2021 1351    BMET    Component Value Date/Time   NA 137 04/14/2021 0325   NA 140 07/08/2019 0932   K 3.7 04/14/2021 0325   CL 107 04/14/2021 0325   CO2 25 04/14/2021 0325   GLUCOSE 106 (H) 04/14/2021 0325   BUN 19 04/14/2021 0325   BUN 21 07/08/2019 0932   CREATININE 0.95 04/14/2021 0325   CALCIUM 8.2 (L) 04/14/2021 0325   GFRNONAA >60 04/14/2021 0325   GFRAA 89 07/08/2019 0932    INR No results found for: INR   Intake/Output Summary (Last 24 hours) at 04/14/2021 0914 Last data filed at 04/14/2021 0749 Gross per 24 hour  Intake 940 ml  Output 1350 ml  Net -410 ml     Assessment:  88 y.o. male is s/p right femoral to anterior tibial artery bypass for great toe ulceration  Plan: Continue to mobilize  Pain control   Okay for discharge from vascular standpoint  Mimie Goering C. Randie Heinz, MD [facility] [place] Office: [phone] Pager: [phone]  04/14/2021 9:14 AM

## 2021-04-14 NOTE — Progress Notes (Signed)
Physical Therapy Treatment Patient Details Name: [name] MRN: [id number] DOB: Jun 07, 1933 Today's Date: 04/14/2021   History of Present Illness 88 y.o. male presents 03/31/21 to ED from OP podietry with RLE wound and increased pain x 3-4 days. XR R foot (+) for soft tissue gas/infection with a gas-forming organism. R common femoral artery and RLE angiogram, LLE angiography 1/23 and Right common femoral to anterior tibial artery bypass 1/31. PMHx significant for CAD s/p CAGB 2006, ischemic cardiomyopathy with chronic systolic CHF, A-flutter s/p ablation ~2014, VT, and sinus syndrome s/p pacemaker placement.    PT Comments    Pt reporting a desire to get cleaned up upon arrival. Thus, focused session around ambulating pt in room and standing at sink as able to achieve pt's request. Pt was able to power up to stand with min guard assist and extra time when provided a place to push up on with his hands, but when no arm rest or surface available for his hands he requires modA to come to stand due to deficits in strength in his legs and pain limiting weight bearing on his R leg. Pt able to only ambulate short bedroom distances with a RW, minA, and mod cues at this time due to pain. However, pt is motivated to improve, ambulating several times during session. Pt with heavy reliance on arm support for standing balance. He is at high risk for falls. Will continue to follow acutely. Current recommendations remain appropriate.   Recommendations for follow up therapy are one component of a multi-disciplinary discharge planning process, led by the attending physician.  Recommendations may be updated based on patient status, additional functional criteria and insurance authorization.  Follow Up Recommendations  Acute inpatient rehab (3hours/day)     Assistance Recommended at Discharge Intermittent Supervision/Assistance  Patient can return home with the following A little help with walking and/or transfers;A  little help with bathing/dressing/bathroom;Assistance with cooking/housework;Assist for transportation;Help with stairs or ramp for entrance   Equipment Recommendations  Rolling walker (2 wheels);BSC/3in1    Recommendations for Other Services       Precautions / Restrictions Precautions Precautions: Fall Precaution Comments: R toe chronic wound - check for weeping, encouraged more heel WB during mobility w/ post-op shoe; staples to RLE; monitor SpO2 and BP Required Braces or Orthoses:  (pos-op shoe in room) Restrictions Weight Bearing Restrictions: No RLE Weight Bearing: Weight bearing as tolerated     Mobility  Bed Mobility Overal bed mobility: Needs Assistance Bed Mobility: Supine to Sit     Supine to sit: Min guard, HOB elevated     General bed mobility comments: Extra time and use of bed rails to sit up L EOB, HOB elevated.    Transfers Overall transfer level: Needs assistance Equipment used: Rolling walker (2 wheels) Transfers: Sit to/from Stand Sit to Stand: Mod assist, Min guard           General transfer comment: Pt able to push up from bed 1x and from commode > 3x with min guard assist, but required modA when trying to power up from standard chair without armrests.    Ambulation/Gait Ambulation/Gait assistance: Min assist Gait Distance (Feet): 16 Feet (x3 bouts of ~8 ft > ~4 ft > ~16 ft) Assistive device: Rolling walker (2 wheels) Gait Pattern/deviations: Decreased stride length, Trunk flexed, Step-to pattern, Antalgic, Decreased weight shift to right, Decreased stance time - right, Knee flexed in stance - left, Knee flexed in stance - right Gait velocity: reduced Gait velocity interpretation: <  1.31 ft/sec, indicative of household ambulator   General Gait Details: Pt with very slow, antalgic gait with decreased R weight shift and stance time. Pt with excessive trunk and bil hip and knee flexion in standing, needing repeated cues to improve upright posture,  with min momentary success. Cues provided to bring RW posterior every other step as he tends to leave it and keep stepping posteriorly. Fatigues quickly, minA to steady with mod cues.   Stairs             Wheelchair Mobility    Modified Rankin (Stroke Patients Only)       Balance Overall balance assessment: Needs assistance Sitting-balance support: Feet supported, No upper extremity supported Sitting balance-Leahy Scale: Fair     Standing balance support: Bilateral upper extremity supported, During functional activity, Single extremity supported Standing balance-Leahy Scale: Poor Standing balance comment: Bil UE support to ambulate, able to reach to buttocks with either UE with contralateral UE on sink for support, x1 LOB needing minA to recover when reaching back with L UE.                            Cognition Arousal/Alertness: Awake/alert Behavior During Therapy: WFL for tasks assessed/performed Overall Cognitive Status: Within Functional Limits for tasks assessed                                          Exercises      General Comments General comments (skin integrity, edema, etc.): Pt performing ADL of washing self via bird bath at sink, primarily in sitting with modA but intermittently in standing with min-modA; reported "feeling swimmy headed", SpO2 down to 85% on RA when ambulating, BP stable in sit after gait bout and SpO2 rebounded to 90s% on RA, RN notified; informed RN of peeling of skin and redness at buttocks and posterior scrotum, applied barrier cream, secretary ordering geomat and air mattress for pt      Pertinent Vitals/Pain Pain Assessment Pain Assessment: Faces Faces Pain Scale: Hurts even more Pain Location: R foot/leg and hip; incisional sites Pain Descriptors / Indicators: Throbbing, Aching, Discomfort, Operative site guarding, Moaning Pain Intervention(s): Limited activity within patient's tolerance, Monitored during  session, Repositioned, Other (comment) (notified RN)    Home Living                          Prior Function            PT Goals (current goals can now be found in the care plan section) Acute Rehab PT Goals Patient Stated Goal: to feel better PT Goal Formulation: With patient Time For Goal Achievement: 04/27/21 Potential to Achieve Goals: Good Progress towards PT goals: Progressing toward goals    Frequency    Min 3X/week      PT Plan Current plan remains appropriate    Co-evaluation              AM-PAC PT "6 Clicks" Mobility   Outcome Measure  Help needed turning from your back to your side while in a flat bed without using bedrails?: A Little Help needed moving from lying on your back to sitting on the side of a flat bed without using bedrails?: A Little Help needed moving to and from a bed to a chair (including a wheelchair)?: A  Little Help needed standing up from a chair using your arms (e.g., wheelchair or bedside chair)?: A Little Help needed to walk in hospital room?: A Little Help needed climbing 3-5 steps with a railing? : A Lot 6 Click Score: 17    End of Session Equipment Utilized During Treatment: Gait belt Activity Tolerance: Patient limited by pain;Patient tolerated treatment well Patient left: with call bell/phone within reach;in chair;with nursing/sitter in room (NT about to shave pt) Nurse Communication: Mobility status;Other (comment) (redness at buttocks, ordering geomat and air mattress, vitals) PT Visit Diagnosis: Other abnormalities of gait and mobility (R26.89);Difficulty in walking, not elsewhere classified (R26.2);Pain;Unsteadiness on feet (R26.81);Muscle weakness (generalized) (M62.81) Pain - Right/Left: Right Pain - part of body: Leg     Time: [id number] PT Time Calculation (min) (ACUTE ONLY): 27 min  Charges:  $Gait Training: 8-22 mins $Therapeutic Activity: 8-22 mins                     Raymond GurneyAnessa Pettis, PT, DPT Acute  Rehabilitation Services  Pager: [phone] Office: [phone]    Jewel Baizenessa M Pettis 04/14/2021, 5:26 PM

## 2021-04-14 NOTE — PMR Pre-admission (Shared)
PMR Admission Coordinator Pre-Admission Assessment  Patient: [name] is an 88 y.o., male MRN: [id number] DOB: 06-Dec-1933 Height: 5' 10"  (177.8 cm) Weight: 89.8 kg  Insurance Information HMO: Yes    PPO:       PCP:       IPA:       80/20:       OTHER: Group [id number] PRIMARY: UHC medicare      Policy#: [id number]      Subscriber: self CM Name: ***      Phone#: ***     Fax#: [phone] Pre-Cert#: [id number]      Employer: Retired Benefits:  Phone #: [phone]     Name: online [email or web address] Eff. Date: 03/13/21     Deduct: $0      Out of Pocket Max: $3600 (met $42.38)      Life Max: N/A CIR: $295 copay for days 1-15      SNF: $0 days 1-20; $196 days 21-39; $0 days 40-100 Outpatient: med nec     Co-Pay: $20 Home Health: 100%      Co-Pay: none DME: 80%     Co-Pay: 20% Providers: in network  SECONDARY:       Policy#:      Phone#:   Development worker, community:       Phone#:   The Actuary for patients in Inpatient Rehabilitation Facilities with attached Privacy Act [place] Records was provided and verbally reviewed with: Patient and Family  Emergency Contact Information Contact Information     Name Relation Home Work [name] Daughter [phone]  [name] Daughter   [phone]       Current Medical History  Patient Admitting Diagnosis: Right anterior-tibial bypass  History of Present Illness:   An 88 y.o. male with medical history significant of coronary artery disease, ischemic cardiomyopathy with chronic systolic CHF with prior EF of 25%, history of CABG in 2006, atrial flutter status post ablation in 2014, VT, also sick sinus syndrome status post pacemaker.  He has been dealing with a right lower extremity callus for several weeks, seen as an outpatient by podiatry.  Over the last 3 to 4 days, he has noticed increased pain to the right lower extremity, increased swelling and redness.  He has a degree of  neuropathy and does not feel much.  He was seen in podiatrist office and was sent to the hospital.  Vascular surgery as well as podiatry consulted, arteriogram shows significant right lower extremity arterial disease now being considered for bypass surgery but first needs cardiology evaluation. Cardiac cath completed 04/11/21.  RIGHT FEMORAL- ANTERIOR TIBIAL ARTERY BYPASS  with (Right)  INTRA OPERATIVE ARTERIOGRAM completed by Dr. Donzetta Matters on 04/12/21.  PT/OT evaluations completed with recommendations for acute inpatient rehab admission.   Patient[facility] medical record from [facility] has been reviewed by the rehabilitation admission coordinator and physician.  Past Medical History  Past Medical History:  Diagnosis Date   Aortic atherosclerosis ([place]) 02/16/2016   BMI 31.0-31.9,adult 05/05/2015   CAD in native artery 02/14/2015   Chronic rhinitis 05/05/2015   Elevated PSA 05/05/2015   [name]   Essential hypertension 02/14/2015   GERD (gastroesophageal reflux disease) 05/05/2015   History of cardiac radiofrequency ablation 09/15/2016   Hx of CABG 09/15/2016   Hyperlipidemia 02/14/2015   Ischemic cardiomyopathy    Lumbar degenerative disc disease 05/05/2015   Partial small bowel obstruction ([place]) 03/05/2017   Prediabetes 05/05/2015   Presence of cardiac pacemaker  B[place]ron[place]   Primary osteoarthritis involving multiple joints 05/05/2015   Last Assessment & Plan:  Relevant Hx: Course: Daily Update: Today's Plan:he has known OA to his joints and he has seen chiropractor in the past for this and he states that he was told he had degenerative changes to his back several times. The knee needs to be xrayed to the right with the swelling , he is taking the oxycodone only as needed but it works well he does not feel dizzy whereas the aleve   RBBB with left anterior fascicular block 02/14/2015   Recurrent incisional hernia 03/05/2017   SSS (sick sinus syndrome) ([place]) 02/17/2015   Thrombocytopenia due to drugs  07/16/2017   Typical atrial flutter ([place]) 05/05/2015   Last Assessment & Plan:  Warfarin was stopped September 07, 2016 by physicians assistant or [place] cardiology after consistent monitoring showed no recurrence of A. fib for more than 1-1/2 years   Ventral hernia without obstruction or gangrene 05/05/2015    Has the patient had major surgery during 100 days prior to admission? Yes  Family History   family history includes AAA (abdominal aortic aneurysm) in his daughter; Diabetes in his daughter and sister; Hyperlipidemia in his daughter; Hypertension in his daughter and daughter; Migraines in his daughter; Rheum arthritis in his daughter.  Current Medications  Current Facility-Administered Medications:    0.9 %  sodium chloride infusion, 250 mL, Intravenous, PRN, Theda Sers, Emma M, PA-C, Stopped at 04/12/21 1143   0.9 %  sodium chloride infusion, 500 mL, Intravenous, Once PRN, Theda Sers, Emma M, PA-C   0.9 %  sodium chloride infusion, , Intravenous, Continuous, Collins, Emma M, PA-C   acetaminophen (TYLENOL) tablet 650 mg, 650 mg, Oral, Q4H PRN, Ulyses Amor, PA-C, 650 mg at 04/12/21 2110   alum & mag hydroxide-simeth (MAALOX/MYLANTA) 200-200-20 MG/5ML suspension 30 mL, 30 mL, Oral, Q6H PRN, Laurence Slate M, PA-C, 30 mL at 04/02/21 1629   aspirin EC tablet 81 mg, 81 mg, Oral, Daily, Ulyses Amor, PA-C, 81 mg at 04/14/21 4196   bisacodyl (DULCOLAX) EC tablet 5 mg, 5 mg, Oral, Daily PRN, Laurence Slate M, PA-C   camphor-menthol [facility]) lotion, , Topical, PRN, Ulyses Amor, PA-C, Given at 04/10/21 2055   carvedilol (COREG) tablet 3.125 mg, 3.125 mg, Oral, BID WC, Donato Heinz, MD, 3.125 mg at 04/14/21 0941   diphenhydrAMINE (BENADRYL) capsule 25 mg, 25 mg, Oral, Q6H PRN, Laurence Slate M, PA-C, 25 mg at 04/12/21 2357   docusate sodium (COLACE) capsule 100 mg, 100 mg, Oral, Daily, Laurence Slate M, PA-C, 100 mg at 04/14/21 0929   enoxaparin (LOVENOX) injection 40 mg, 40 mg,  Subcutaneous, Q24H, Collins, Emma M, PA-C, 40 mg at 04/13/21 2108   guaiFENesin-dextromethorphan (ROBITUSSIN DM) 100-10 MG/5ML syrup 15 mL, 15 mL, Oral, Q4H PRN, Laurence Slate M, PA-C, 15 mL at 04/12/21 2114   hydrALAZINE (APRESOLINE) injection 5 mg, 5 mg, Intravenous, Q20 Min PRN, Theda Sers, Emma M, PA-C   HYDROmorphone (DILAUDID) injection 0.5-1 mg, 0.5-1 mg, Intravenous, Q2H PRN, Theda Sers, Emma M, PA-C   hydrOXYzine (ATARAX) 10 MG/5ML syrup 10 mg, 10 mg, Oral, TID PRN, Theda Sers, Emma M, PA-C   levofloxacin [facility]) tablet 750 mg, 750 mg, Oral, Daily, Pahwani, Ravi, MD, 750 mg at 04/14/21 1312   loratadine (CLARITIN) tablet 10 mg, 10 mg, Oral, Daily, Laurence Slate M, PA-C, 10 mg at 04/14/21 2229   losartan (COZAAR) tablet 25 mg, 25 mg, Oral, Daily, Donato Heinz, MD, 25 mg at  04/14/21 0928   magnesium sulfate IVPB 2 g 50 mL, 2 g, Intravenous, Daily PRN, Laurence Slate M, PA-C   melatonin tablet 5 mg, 5 mg, Oral, QHS, Collins, Emma M, [place], 5 mg at 04/13/21 2108   metroNIDAZOLE (FLAGYL) tablet 500 mg, 500 mg, Oral, Q12H, Pahwani, Ravi, MD   ondansetron (ZOFRAN) injection 4 mg, 4 mg, Intravenous, Q6H PRN, Theda Sers, Emma M, PA-C   oxyCODONE-acetaminophen (PERCOCET/ROXICET) 5-325 MG per tablet 1-2 tablet, 1-2 tablet, Oral, Q4H PRN, Ulyses Amor, PA-C, 2 tablet at 04/14/21 0929   pantoprazole (PROTONIX) EC tablet 40 mg, 40 mg, Oral, Daily, Laurence Slate M, PA-C, 40 mg at 04/14/21 7253   phenol (CHLORASEPTIC) mouth spray 1 spray, 1 spray, Mouth/Throat, PRN, Theda Sers, Emma M, PA-C   potassium chloride SA (KLOR-CON M) CR tablet 20-40 mEq, 20-40 mEq, Oral, Daily PRN, Theda Sers, Emma M, PA-C   senna-docusate (Senokot-S) tablet 1 tablet, 1 tablet, Oral, QHS PRN, Theda Sers, Emma M, PA-C   sodium chloride flush (NS) 0.9 % injection 3 mL, 3 mL, Intravenous, Q12H, Collins, Emma M, PA-C, 3 mL at 04/13/21 2108   sodium chloride flush (NS) 0.9 % injection 3 mL, 3 mL, Intravenous, PRN, Theda Sers, Emma M, PA-C    tamsulosin [facility]) capsule 0.4 mg, 0.4 mg, Oral, QPC supper, Collins, Emma M, PA-C, 0.4 mg at 04/13/21 1710  Patients Current Diet:  Diet Order             Diet Heart Room service appropriate? Yes with Assist; Fluid consistency: Thin  Diet effective now                   Precautions / Restrictions Precautions Precautions: Fall Precaution Comments: R toe chronic wound - check for weeping, encouraged more heel WB during mobility w/ post-op shoe; staples to RLE Restrictions Weight Bearing Restrictions: No RLE Weight Bearing: Weight bearing as tolerated   Has the patient had 2 or more falls or a fall with injury in the past year? No  Prior Activity Level Community (5-7x/wk): Went out daily.  Prior Functional Level Self Care: Did the patient need help bathing, dressing, using the toilet or eating? Independent  Indoor Mobility: Did the patient need assistance with walking from room to room (with or without device)? Independent  Stairs: Did the patient need assistance with internal or external stairs (with or without device)? Independent  Functional Cognition: Did the patient need help planning regular tasks such as shopping or remembering to take medications? Independent  Patient Information Are you of Hispanic, Latino/a,or Spanish origin?: A. No, not of Hispanic, Latino/a, or Spanish origin What is your race?: A. White Do you need or want an interpreter to communicate with a doctor or health care staff?: 0. No  Patient's Response To:  Health Literacy and Transportation Is the patient able to respond to health literacy and transportation needs?: Yes Health Literacy - How often do you need to have someone help you when you read instructions, pamphlets, or other written material from your doctor or pharmacy?: Often (His vision has changed for small print, needs help reading.) In the past 12 months, has lack of transportation kept you from medical appointments or from getting  medications?: No In the past 12 months, has lack of transportation kept you from meetings, work, or from getting things needed for daily living?: No  Home Assistive Devices / [place] Devices/Equipment: Radio producer (specify quad or straight) Home Equipment: Cane - single point, Standard Walker, Grab bars - tub/shower  Prior Device Use: Indicate devices/aids used by the patient prior to current illness, exacerbation or injury? Walker and [employer]  Current Functional Level Cognition  Overall Cognitive Status: Within Functional Limits for tasks assessed Orientation Level: Oriented X4 Following Commands: Follows one step commands with increased time Safety/Judgement: Decreased awareness of safety, Decreased awareness of deficits General Comments: SMT deficits; follows 1-step verbal commands with good accuracy. HOH.    Extremity Assessment (includes Sensation/Coordination)  Upper Extremity Assessment: Defer to OT evaluation  Lower Extremity Assessment: RLE deficits/detail RLE Deficits / Details: erythema noted R foot and calf with edema; noted generalized weakness with functional mobility; ulcer on R big toe and stables anterior lower leg with some minor weeping    ADLs  Overall ADL's : Needs assistance/impaired Eating/Feeding: NPO Eating/Feeding Details (indicate cue type and reason): pending sx Grooming: Wash/dry hands, Wash/dry face, Set up, Sitting Grooming Details (indicate cue type and reason): on BSC Upper Body Bathing: Set up, Sitting Lower Body Bathing: Sitting/lateral leans, Minimal assistance Upper Body Dressing : Set up, Sitting Lower Body Dressing: Maximal assistance, Sitting/lateral leans Lower Body Dressing Details (indicate cue type and reason): Max A to don footwear seated EOB. Toilet Transfer: Stand-pivot, Moderate assistance Toilet Transfer Details (indicate cue type and reason): Simulated with transfer to recliner. Mod A for sit to stand from slightly elevated  EOB with cues for hand placement. Slow to rise. Knees/trunk in flexion upon standing requires cues for upright posture. Toileting- Clothing Manipulation and Hygiene: Moderate assistance, Sit to/from stand Toileting - Clothing Manipulation Details (indicate cue type and reason): use of RW to balance, OT performing rear peri care while Pt pushed up from [facility] Functional mobility during ADLs: Minimal assistance, Rolling walker (2 wheels) General ADL Comments: Limited by pain in RLE. First time up since surgery 1/31.    Mobility  Overal bed mobility: Needs Assistance Bed Mobility: Sit to Supine Supine to sit: HOB elevated, Min assist Sit to supine: Min assist General bed mobility comments: MinA to lift legs onto bed with transition sit > supine.    Transfers  Overall transfer level: Needs assistance Equipment used: Rolling walker (2 wheels) Transfers: Sit to/from Stand Sit to Stand: Min assist Bed to/from chair/wheelchair/BSC transfer type:: Stand pivot Stand pivot transfers: Min assist Step pivot transfers: Min guard General transfer comment: Cues provided to scoot to edge of chair and push up with hands on arm rests of chair. x10 mini push ups to warm-up prior to coming to stand, minA to power up and extend hips to come to full upright standing from recliner. x3 additional mini initiation of sit to stands from EOB with min guard assist, not coming to full stand either of the 3x.    Ambulation / Gait / Stairs / Wheelchair Mobility  Ambulation/Gait Ambulation/Gait assistance: Herbalist (Feet): 20 Feet Assistive device: Rolling walker (2 wheels) Gait Pattern/deviations: Decreased stride length, Trunk flexed, Step-to pattern, Antalgic, Decreased weight shift to right, Decreased stance time - right General Gait Details: Pt with very slow, antalgic gait with decreased R weight shift and stance time. Pt able to take a few steps anteriorly then a few posteriorly repeatedly. Cues  provided to bring RW posterior every other step as he tends to leave it and keep stepping posteriorly. Gait velocity: reduced Gait velocity interpretation: <1.31 ft/sec, indicative of household ambulator    Posture / Balance Dynamic Sitting Balance Sitting balance - Comments: Guarding needed with dynamic balance at EOB to prevent LOB. Balance Overall balance assessment:  Needs assistance Sitting-balance support: Feet supported, No upper extremity supported Sitting balance-Leahy Scale: Fair Sitting balance - Comments: Guarding needed with dynamic balance at EOB to prevent LOB. Standing balance support: Bilateral upper extremity supported, During functional activity Standing balance-Leahy Scale: Poor Standing balance comment: Reliant on Bil UE support and external assist.    Special needs/care consideration Continuous Drip IV  KVO and Skin Right lower leg post op incision with stapes.   Previous Home Environment (from acute therapy documentation) Living Arrangements: Alone Available Help at Discharge: Family, Available PRN/intermittently Type of Home: House Home Layout: One level Home Access: Stairs to enter Entrance Stairs-Rails: Can reach both Entrance Stairs-Number of Steps: 8 Bathroom Shower/Tub: Optometrist: Yes Home Care Services: No  Discharge Living Setting Plans for Discharge Living Setting: Patient's home, House, Alone (Lives alone, but daughters are close by.) Type of Home at Discharge: House Discharge Home Layout: One level Discharge Home Access: Stairs to enter Entrance Stairs-Rails: Right, Left, Can reach both Entrance Stairs-Number of Steps: 7-8 steps Discharge Bathroom Shower/Tub: Tub/shower unit, Curtain Discharge Bathroom Toilet: Standard Discharge Bathroom Accessibility: Yes How Accessible: Accessible via walker Does the patient have any problems obtaining your medications?: Yes  (Describe)  Social/Family/Support Systems Patient Roles: Parent (Has 2 daughters close by.) Contact Information: Rosey Bath - daughter - (c) [phone] Anticipated Caregiver: Daughters Anticipated Caregiver's Contact Information: Verlon Au - daughter - [phone] Ability/Limitations of Caregiver: Merdis Delay works from home; Delle Reining does not work and is at home. Caregiver Availability: Intermittent Discharge Plan Discussed with Primary Caregiver: Yes Is Caregiver In Agreement with Plan?: Yes Does Caregiver/Family have Issues with Lodging/Transportation while Pt is in Rehab?: No  Goals Patient/Family Goal for Rehab: PT/OT mod I and supervision goals Expected length of stay: 7 days Pt/Family Agrees to Admission and willing to participate: Yes Program Orientation Provided & Reviewed with Pt/Caregiver Including Roles  & Responsibilities: Yes  Decrease burden of Care through IP rehab admission: N/A  Possible need for SNF placement upon discharge: Not anticipated  Patient Condition: I have reviewed medical records from [facility], spoken with CSW, and patient and daughter. I met with patient at the bedside for inpatient rehabilitation assessment.  Patient will benefit from ongoing PT and OT, can actively participate in 3 hours of therapy a day 5 days of the week, and can make measurable gains during the admission.  Patient will also benefit from the coordinated team approach during an Inpatient Acute Rehabilitation admission.  The patient will receive intensive therapy as well as Rehabilitation physician, nursing, social worker, and care management interventions.  Due to bladder management, bowel management, safety, skin/wound care, disease management, medication administration, pain management, and patient education the patient requires 24 hour a day rehabilitation nursing.  The patient is currently *** with mobility and basic ADLs.  Discharge setting and therapy post discharge at home with home  health is anticipated.  Patient has agreed to participate in the Acute Inpatient Rehabilitation Program and will admit {Time; today/tomorrow:10263}.  Preadmission Screen Completed By:  Retta Diones, 04/14/2021 1:47 PM ______________________________________________________________________   Discussed status with Dr. Marland Kitchen on *** at *** and received approval for admission today.  Admission Coordinator:  Retta Diones, RN, time Marland KitchenSudie Grumbling ***   Assessment/Plan: Diagnosis: Does the need for close, 24 hr/day Medical supervision in concert with the patient's rehab needs make it unreasonable for this patient to be served in a less intensive setting? {yes_no_potentially:3041433} Co-Morbidities requiring supervision/potential complications: *** Due to {due YJ:8563149},  does the patient require 24 hr/day rehab nursing? {yes_no_potentially:3041433} Does the patient require coordinated care of a physician, rehab nurse, PT, OT, and SLP to address physical and functional deficits in the context of the above medical diagnosis(es)? {yes_no_potentially:3041433} Addressing deficits in the following areas: {deficits:3041436} Can the patient actively participate in an intensive therapy program of at least 3 hrs of therapy 5 days a week? {yes_no_potentially:3041433} The potential for patient to make measurable gains while on inpatient rehab is {potential:3041437} Anticipated functional outcomes upon discharge from inpatient rehab: {functional outcomes:304600100} PT, {functional outcomes:304600100} OT, {functional outcomes:304600100} SLP Estimated rehab length of stay to reach the above functional goals is: *** Anticipated discharge destination: {anticipated dc setting:21604} 10. Overall Rehab/Functional Prognosis: {potential:3041437}   MD Signature: ***

## 2021-04-14 NOTE — Progress Notes (Signed)
PROGRESS NOTE    [name]  [id number] DOB: 06/28/1933 DOA: 03/31/2021 PCP: Gordan PaymentGrisso, Greg A., MD   Brief Narrative:  88 y.o. male with medical history significant of coronary artery disease, ischemic cardiomyopathy with chronic systolic CHF with prior EF of 84%25%, history of CABG in 2006, a flutter status post ablation in 2014, VT, also sick sinus syndrome status post pacemaker.  He has been dealing with a right lower extremity callus for several weeks, seen as an outpatient by podiatry.  Over the last 3 to 4 days, he has noticed increased pain to the right lower extremity, increased swelling and redness.  He has a degree of neuropathy and does not feel much.  He was seen in podiatrist office and was sent to the hospital.  Vascular surgery as well as podiatry consulted, arteriogram shows significant right lower extremity arterial disease. Status post right common femoral to anterior tibial artery bypass with distal vein patch angioplasty on 04/12/2021.  Assessment & Plan:   Principal Problem:   Cellulitis and abscess of foot Active Problems:   Abnormal nuclear stress test   CAD (coronary artery disease) with history of CABG   Hx of CABG   Hyperlipidemia   Dizziness   Presence of cardiac pacemaker   Sick sinus syndrome (HCC) status post pacemaker   Chronic combined systolic and diastolic heart failure (HCC)   Macular degeneration   PAD (peripheral artery disease) (HCC)   Paroxysmal atrial flutter (HCC)   Urinary hesitancy   Critical limb ischemia of both lower extremities (HCC)   Rash   Abnormal nuclear cardiac imaging test  Cellulitis and abscess of foot/critical limb ischemia of both lower extremities/PAD- (present on admission) - Patient admitted with right foot cellulitis and abscess.  - Could not get an MRI due to pacemaker.  -initially: pus draining from right greater toe/Cellulitis extends up to mid upper shins.  -antibiotics-- IV vanc/zosyn initially, narrowed to rocephin  but patient developed an itching rash (unclear if related to new abx but timing coincides)- changed back to vanc/zosyn (also redness on IV rocephin seems to have worsened).  Due to no improvement in the rash, antibiotics were transitioned from zosyn/vanc to flagyl/levo.  I will continue both of these medications until 04/16/2021. -Blood cultures: no growth to date. Wound culture skin flora -Vascular surgery consulted, underwent arteriogram 1/23, Status post right common femoral to anterior tibial artery bypass with distal vein patch angioplasty on 04/12/2021. -Podiatry consulted as well:.  Bone scan does not show osteomyelitis.  No intervention recommended by podiatry.   Drug/morbilliform rash: Itching has resolved, rash almost gone.  Continue benadryl/H2/H1 blocker - abx changed as above   Urinary hesitancy - Possibly due to BPH.  This is been going on for quite some time, but most recently has noticed that he needs to urinate more often during the nighttime.  He is supposed to see urology outpatient - started on Flomax while here, no further issues, urinating well   Paroxysmal atrial flutter (HCC) - history of ablation, now has pacemaker.  Apparently he was on Coumadin in the past, and per care everywhere notes, in 2018, his Coumadin was stopped as he had no recurrence for A. fib or flutter for 1.5 years at that time.  He has been started on Coreg by cardiology this admission.   Chronic systolic CHF (congestive heart failure) (HCC)/preoperative risk assessment/history of CAD s/p CABG/PCI -[facility], Dr. Sampson GoonFitzgerald, in 2018.  It was mentioned that the EF at  that time was 40 to 45%, and recently had a stress test which was negative.  2D echo done here shows EF 40-45% with mildly decreased LV function.  Suspect unchanged.  He has no heart failure symptoms, no angina.  Currently he is not on goal-directed therapy, not clear when he stopped the medications but back in 2018 he was on  metoprolol 12.5 twice daily as well as aspirin.  Discussed with the daughter, he has self discontinued the medication as he just does not like to take pills.  Currently appears stable.  Vascular surgery requested cardiac risk assessment.  Patient underwent LHC on 04/11/2021 which showed patent LIMA-LAD, patent SVG-ramus with occlusion of sequential portion of graft to OM 3, occluded SVG-D1, 80% proximal RCA stenosis, 95% distal RCA stenosis. Given lack of symptoms and not wanting to delay intervention for his critical limb ischemia, no PCI done.  If he does develop angina, could consider PCI to proximal and distal RCA.  He has been started on Coreg which he is tolerating well, losartan added by cardiology today.   Sick sinus syndrome (HCC) status post pacemaker- (present on admission) - Keep on medical telemetry.  He is paced   Dizziness -Chronic    Hyperlipidemia- (present on admission) - Has not been taking statin at home.  LDL at 71  Essential hypertension-resolved as of 04/06/2021 -Monitor blood pressure, currently not taking any medications, has remained within acceptable parameters   Disposition: PT OT recommended CIR.  CIR coordinator had recommended consulting CIR MD.  Consultation request was placed yesterday.  Awaiting assessment and further decision.  If no CIR, patient will be discharged to SNF.  Patient agreeable to either option.  DVT prophylaxis: SCD's Start: 04/12/21 1705 enoxaparin (LOVENOX) injection 40 mg Start: 04/11/21 2200   Code Status: Full Code  Family Communication:  None present at bedside.  Plan of care discussed with patient in length and he/she verbalized understanding and agreed with it.  Status is: Inpatient  Remains inpatient appropriate because: Medically stable.  Needs discharged to CIR when bed is available and insurance authorization approved.  Estimated body mass index is 28.41 kg/m as calculated from the following:   Height as of this encounter: 5\' 10"   (1.778 m).   Weight as of this encounter: 89.8 kg.    Nutritional Assessment: Body mass index is 28.41 kg/m.Marland Kitchen Seen by dietician.  I agree with the assessment and plan as outlined below: Nutrition Status:        . Skin Assessment: I have examined the patient's skin and I agree with the wound assessment as performed by the wound care RN as outlined below:    Consultants:  Cardiology Vascular surgery  Procedures:  As above  Antimicrobials:  Anti-infectives (From admission, onward)    Start     Dose/Rate Route Frequency Ordered Stop   04/10/21 1400  levofloxacin (LEVAQUIN) IVPB 500 mg        500 mg 100 mL/hr over 60 Minutes Intravenous Every 24 hours 04/10/21 1313     04/10/21 1345  metroNIDAZOLE (FLAGYL) IVPB 500 mg        500 mg 100 mL/hr over 60 Minutes Intravenous Every 12 hours 04/10/21 1246     04/10/21 0000  vancomycin (VANCOREADY) IVPB 1250 mg/250 mL  Status:  Discontinued        1,250 mg 166.7 mL/hr over 90 Minutes Intravenous Every 24 hours 04/09/21 0840 04/10/21 1246   04/09/21 1000  piperacillin-tazobactam (ZOSYN) IVPB 3.375 g  Status:  Discontinued        3.375 g 12.5 mL/hr over 240 Minutes Intravenous Every 8 hours 04/09/21 0840 04/10/21 1246   04/09/21 0930  vancomycin (VANCOREADY) IVPB 1250 mg/250 mL        1,250 mg 166.7 mL/hr over 90 Minutes Intravenous  Once 04/09/21 0810 04/09/21 1109   04/07/21 1400  cefTRIAXone (ROCEPHIN) 2 g in sodium chloride 0.9 % 100 mL IVPB  Status:  Discontinued        2 g 200 mL/hr over 30 Minutes Intravenous Every 24 hours 04/07/21 1035 04/09/21 0809   04/03/21 1800  vancomycin (VANCOREADY) IVPB 1250 mg/250 mL  Status:  Discontinued        1,250 mg 166.7 mL/hr over 90 Minutes Intravenous Every 24 hours 04/03/21 1455 04/07/21 1035   04/01/21 1830  vancomycin (VANCOREADY) IVPB 1750 mg/350 mL  Status:  Discontinued        1,750 mg 175 mL/hr over 120 Minutes Intravenous Every 24 hours 03/31/21 1632 04/03/21 1455   04/01/21  0630  clindamycin (CLEOCIN) IVPB 600 mg  Status:  Discontinued        600 mg 100 mL/hr over 30 Minutes Intravenous Every 8 hours 04/01/21 0540 04/04/21 1119   04/01/21 0000  piperacillin-tazobactam (ZOSYN) IVPB 3.375 g  Status:  Discontinued        3.375 g 12.5 mL/hr over 240 Minutes Intravenous Every 8 hours 03/31/21 1708 04/07/21 1035   03/31/21 2200  clindamycin (CLEOCIN) IVPB 600 mg  Status:  Discontinued        600 mg 100 mL/hr over 30 Minutes Intravenous Every 8 hours 03/31/21 1935 04/01/21 0540   03/31/21 1945  vancomycin (VANCOCIN) IVPB 1000 mg/200 mL premix  Status:  Discontinued        1,000 mg 200 mL/hr over 60 Minutes Intravenous  Once 03/31/21 1935 03/31/21 1944   03/31/21 1945  piperacillin-tazobactam (ZOSYN) IVPB 3.375 g  Status:  Discontinued        3.375 g 100 mL/hr over 30 Minutes Intravenous  Once 03/31/21 1935 03/31/21 1944   03/31/21 1630  vancomycin (VANCOREADY) IVPB 2000 mg/400 mL        2,000 mg 200 mL/hr over 120 Minutes Intravenous  Once 03/31/21 1619 03/31/21 1935   03/31/21 1600  vancomycin (VANCOREADY) IVPB 1750 mg/350 mL  Status:  Discontinued        1,750 mg 175 mL/hr over 120 Minutes Intravenous  Once 03/31/21 1541 03/31/21 1619   03/31/21 1545  piperacillin-tazobactam (ZOSYN) IVPB 3.375 g        3.375 g 100 mL/hr over 30 Minutes Intravenous  Once 03/31/21 1537 03/31/21 1633         Subjective: Patient seen and examined.  He complains of pain at the right lower extremity and the graft site.  No other complaint otherwise.  Objective: Vitals:   04/14/21 0536 04/14/21 0559 04/14/21 0747 04/14/21 0940  BP: 110/63  113/66 134/76  Pulse: 60 66 65 67  Resp: 16 16 18 15   Temp: 97.9 F (36.6 C)  98 F (36.7 C)   TempSrc: Oral  Oral   SpO2: 99% 97% 97%   Weight:  89.8 kg    Height:        Intake/Output Summary (Last 24 hours) at 04/14/2021 1118 Last data filed at 04/14/2021 0749 Gross per 24 hour  Intake 940 ml  Output 750 ml  Net 190 ml     Filed Weights   04/11/21 0500 04/13/21 0503 04/14/21 0559  Weight: 87 kg 90.3 kg 89.8 kg    Examination:  General exam: Appears calm and comfortable  Respiratory system: Clear to auscultation. Respiratory effort normal. Cardiovascular system: S1 & S2 heard, RRR. No JVD, murmurs, rubs, gallops or clicks. No pedal edema. Gastrointestinal system: Abdomen is nondistended, soft and nontender. No organomegaly or masses felt. Normal bowel sounds heard. Central nervous system: Alert and oriented. No focal neurological deficits. Extremities: Symmetric 5 x 5 power. Skin: Incision on the right lower extremity looks dry and intact and clean however he does have some erythema at right lower extremity.  Shallow ulcer at the right big toe. Psychiatry: Judgement and insight appear normal. Mood & affect appropriate.   Data Reviewed: I have personally reviewed following labs and imaging studies  CBC: Recent Labs  Lab 04/10/21 0245 04/11/21 0733 04/12/21 0320 04/13/21 0212 04/14/21 0325  WBC 12.5* 10.7* 7.2 12.0* 6.5  HGB 14.1 13.8 12.9* 11.6* 11.1*  HCT 43.9 42.2 38.8* 33.8* 33.2*  MCV 91.8 90.0 89.4 88.9 90.5  PLT 210 217 207 207 186    Basic Metabolic Panel: Recent Labs  Lab 04/10/21 0245 04/11/21 0733 04/12/21 0320 04/12/21 1733 04/13/21 0212 04/14/21 0325  NA 136 136 135  --  137 137  K 3.9 3.9 4.2  --  4.1 3.7  CL 107 104 105  --  107 107  CO2 20* 21* 21*  --  23 25  GLUCOSE 99 102* 158*  --  149* 106*  BUN 13 14 16   --  21 19  CREATININE 0.94 0.98 0.89 0.95 0.98 0.95  CALCIUM 8.4* 8.5* 8.5*  --  8.3* 8.2*    GFR: Estimated Creatinine Clearance: 64.1 mL/min (by C-G formula based on SCr of 0.95 mg/dL). Liver Function Tests: Recent Labs  Lab 04/13/21 0212  AST 33  ALT 27  ALKPHOS 47  BILITOT 0.4  PROT 5.0*  ALBUMIN 2.4*    No results for input(s): LIPASE, AMYLASE in the last 168 hours. No results for input(s): AMMONIA in the last 168 hours. Coagulation  Profile: No results for input(s): INR, PROTIME in the last 168 hours. Cardiac Enzymes: No results for input(s): CKTOTAL, CKMB, CKMBINDEX, TROPONINI in the last 168 hours. BNP (last 3 results) No results for input(s): PROBNP in the last 8760 hours. HbA1C: No results for input(s): HGBA1C in the last 72 hours. CBG: No results for input(s): GLUCAP in the last 168 hours. Lipid Profile: Recent Labs    04/13/21 0212  CHOL 83  HDL 27*  LDLCALC 48  TRIG 39  CHOLHDL 3.1    Thyroid Function Tests: No results for input(s): TSH, T4TOTAL, FREET4, T3FREE, THYROIDAB in the last 72 hours. Anemia Panel: No results for input(s): VITAMINB12, FOLATE, FERRITIN, TIBC, IRON, RETICCTPCT in the last 72 hours. Sepsis Labs: No results for input(s): PROCALCITON, LATICACIDVEN in the last 168 hours.  Recent Results (from the past 240 hour(s))  MRSA Next Gen by PCR, Nasal     Status: None   Collection Time: 04/10/21  1:00 PM   Specimen: Nasal Mucosa; Nasal Swab  Result Value Ref Range Status   MRSA by PCR Next Gen NOT DETECTED NOT DETECTED Final    Comment: (NOTE) The GeneXpert MRSA Assay (FDA approved for NASAL specimens only), is one component of a comprehensive MRSA colonization surveillance program. It is not intended to diagnose MRSA infection nor to guide or monitor treatment for MRSA infections. Test performance is not FDA approved in patients less than 86 years old. Performed  at [facility], [address], [place], [place] [address]       Radiology Studies: No results found.  Scheduled Meds:  aspirin EC  81 mg Oral Daily   carvedilol  3.125 mg Oral BID WC   docusate sodium  100 mg Oral Daily   enoxaparin (LOVENOX) injection  40 mg Subcutaneous Q24H   loratadine  10 mg Oral Daily   losartan  25 mg Oral Daily   melatonin  5 mg Oral QHS   pantoprazole  40 mg Oral Daily   sodium chloride flush  3 mL Intravenous Q12H   tamsulosin  0.4 mg Oral QPC supper   Continuous  Infusions:  sodium chloride Stopped (04/12/21 1143)   sodium chloride     sodium chloride     levofloxacin (LEVAQUIN) IV 500 mg (04/13/21 1351)   magnesium sulfate bolus IVPB     metronidazole Stopped (04/14/21 2637)     LOS: 13 days   Time spent: 25 minutes  Hughie Closs, MD [facility]  04/14/2021, 11:18 AM  Please page via Loretha Stapler and do not message via secure chat for anything urgent. Secure chat can be used for anything non urgent.  How to contact the [facility] Attending or Consulting provider 7A - 7P or covering provider during after hours 7P -7A, for this patient?  Check the care team in [facility] and look for a) attending/consulting TRH provider listed and b) the [facility] team listed. Page or secure chat 7A-7P. Log into [email or web address] and use [place]'s universal password to access. If you do not have the password, please contact the hospital operator. Locate the [facility] provider you are looking for under [facility] and page to a number that you can be directly reached. If you still have difficulty reaching the provider, please page the [facility] (Director on Call) for the Hospitalists listed on amion for assistance.

## 2021-04-14 NOTE — TOC Progression Note (Signed)
Transition of Care [facility]) - Progression Note    Patient Details  Name: [name] MRN: [id number] Date of Birth: 1933-03-26  Transition of Care [facility]) CM/SW [place],  Phone Number: 04/14/2021, 11:59 AM  Clinical Narrative:     Received call form patient's daughter,[name]- she explained the patient needs therapy and inquired about recommendations. CSW explained recommendations and informed CIR is being pursued. she advised the patient lives home alone and requested CSW send SNF referral to [facility] in [place] as back up plan.  CSW explained will also visit with the patient- will send referrals as requested CSW will continue to follow and assist with discharge planning.  Thurmond Butts, MSW, LCSW Clinical Social Worker     Expected Discharge Plan: Home w Home Health Services Barriers to Discharge: Continued Medical Work up  Expected Discharge Plan and Services Expected Discharge Plan: [place] In-house Referral: NA Discharge Planning Services: CM Consult Post Acute Care Choice: [place] arrangements for the past 2 months: Single Family Home                 DME Arranged:  (patietn wants to wait for DME arrangement)                     Social Determinants of Health (SDOH) Interventions    Readmission Risk Interventions No flowsheet data found.

## 2021-04-15 LAB — CBC
HCT: 36.1 % — ABNORMAL LOW (ref 39.0–52.0)
Hemoglobin: 11.9 g/dL — ABNORMAL LOW (ref 13.0–17.0)
MCH: 30 pg (ref 26.0–34.0)
MCHC: 33 g/dL (ref 30.0–36.0)
MCV: 90.9 fL (ref 80.0–100.0)
Platelets: 190 10*3/uL (ref 150–400)
RBC: 3.97 MIL/uL — ABNORMAL LOW (ref 4.22–5.81)
RDW: 11.8 % (ref 11.5–15.5)
WBC: 6.5 10*3/uL (ref 4.0–10.5)
nRBC: 0 % (ref 0.0–0.2)

## 2021-04-15 LAB — BASIC METABOLIC PANEL
Anion gap: 6 (ref 5–15)
BUN: 18 mg/dL (ref 8–23)
CO2: 25 mmol/L (ref 22–32)
Calcium: 8.2 mg/dL — ABNORMAL LOW (ref 8.9–10.3)
Chloride: 104 mmol/L (ref 98–111)
Creatinine, Ser: 0.86 mg/dL (ref 0.61–1.24)
GFR, Estimated: >60 mL/min (ref >60)
Glucose, Bld: 99 mg/dL (ref 70–99)
Potassium: 3.7 mmol/L (ref 3.5–5.1)
Sodium: 135 mmol/L (ref 135–145)

## 2021-04-15 LAB — SARS CORONAVIRUS 2 (TAT 6-24 HRS): SARS Coronavirus 2: NEGATIVE

## 2021-04-15 NOTE — Care Management Important Message (Signed)
Important Message  Patient Details  Name: [name] MRN: [id number] Date of Birth: 1933-05-30   Medicare Important Message Given:  Yes     Shelda Altes 04/15/2021, 8:54 AM

## 2021-04-15 NOTE — Progress Notes (Addendum)
°  Progress Note    04/15/2021 7:43 AM 3 Days Post-Op  Subjective:  No new complaints   Vitals:   04/14/21 2343 04/15/21 0433  BP: (!) 102/57 (!) 157/80  Pulse: 65 72  Resp: 10 16  Temp: 97.8 F (36.6 C) 98 F (36.7 C)  SpO2: 98% 100%   Physical Exam: Lungs:  non labored Incisions:  R groin and lower leg incision c/d/i Extremities:  brisk R DP by doppler; R GT ulcer dry Neurologic: A&O  CBC    Component Value Date/Time   WBC 6.5 04/15/2021 0234   RBC 3.97 (L) 04/15/2021 0234   HGB 11.9 (L) 04/15/2021 0234   HCT 36.1 (L) 04/15/2021 0234   PLT 190 04/15/2021 0234   MCV 90.9 04/15/2021 0234   MCH 30.0 04/15/2021 0234   MCHC 33.0 04/15/2021 0234   RDW 11.8 04/15/2021 0234   LYMPHSABS 0.9 03/31/2021 1351   MONOABS 0.4 03/31/2021 1351   EOSABS 0.0 03/31/2021 1351   BASOSABS 0.0 03/31/2021 1351    BMET    Component Value Date/Time   NA 135 04/15/2021 0234   NA 140 07/08/2019 0932   K 3.7 04/15/2021 0234   CL 104 04/15/2021 0234   CO2 25 04/15/2021 0234   GLUCOSE 99 04/15/2021 0234   BUN 18 04/15/2021 0234   BUN 21 07/08/2019 0932   CREATININE 0.86 04/15/2021 0234   CALCIUM 8.2 (L) 04/15/2021 0234   GFRNONAA >60 04/15/2021 0234   GFRAA 89 07/08/2019 0932    INR No results found for: INR   Intake/Output Summary (Last 24 hours) at 04/15/2021 0743 Last data filed at 04/15/2021 0436 Gross per 24 hour  Intake 1260 ml  Output 1200 ml  Net 60 ml     Assessment/Plan:  88 y.o. male is s/p R fem-ATA bypass for GT ulcer 3 Days Post-Op   RLE well perfused by doppler exam GT ulcer dry; continue current wound care OOB with therapy Ok for d/c from vascular standpoint   Dagoberto Ligas, PA-C [facility] [phone] 04/15/2021 7:43 AM  I have independently interviewed and examined patient and agree with PA assessment and plan above.   Dezeray Puccio C. Donzetta Matters, MD [facility] [place] Office: [phone] Pager:  [phone]

## 2021-04-15 NOTE — Progress Notes (Signed)
Occupational Therapy Treatment Patient Details Name: [name] L Caruth MRN: [id number] DOB: 01/03/1934 Today's Date: 04/15/2021   History of present illness 88 y.o. male presents 03/31/21 to ED from OP podietry with RLE wound and increased pain x 3-4 days. XR R foot (+) for soft tissue gas/infection with a gas-forming organism. R common femoral artery and RLE angiogram, LLE angiography 1/23 and Right common femoral to anterior tibial artery bypass 1/31. PMHx significant for CAD s/p CAGB 2006, ischemic cardiomyopathy with chronic systolic CHF, A-flutter s/p ablation ~2014, VT, and sinus syndrome s/p pacemaker placement.   OT comments  OT treatment session with focus on self-care re-education, functional transfers and mobility with use of RW. Patient denies pain at rest but has sharp/shooting/throbbing pain in R groin>R lower leg with movement limiting further mobility and participation with ADLs. Patient tolerated progression to recliner with use of RW and Min-Mod A. Patient continues to require Mod-Max A for LB ADLs and Min-Mod A (requires less assist from elevated surface) for sit to stand transfers and would benefit from continued acute OT services in prep for safe d/c to next level of care. Patient remains highly motivated and would benefit from AIR level therapies to maximize safety/independence with self-care tasks. OT will continue to follow acutely.    Recommendations for follow up therapy are one component of a multi-disciplinary discharge planning process, led by the attending physician.  Recommendations may be updated based on patient status, additional functional criteria and insurance authorization.    Follow Up Recommendations  Acute inpatient rehab (3hours/day)    Assistance Recommended at Discharge Frequent or constant Supervision/Assistance  Patient can return home with the following  A little help with walking and/or transfers;Assistance with feeding;Assistance with  cooking/housework;Assist for transportation;Help with stairs or ramp for entrance;A lot of help with bathing/dressing/bathroom   Equipment Recommendations  Tub/shower seat    Recommendations for Other Services      Precautions / Restrictions Precautions Precautions: Fall Precaution Comments: R toe chronic wound - check for weeping, encouraged more heel WB during mobility w/ post-op shoe; staples to RLE; monitor SpO2 and BP Required Braces or Orthoses:  (pos-op shoe in room) Restrictions Weight Bearing Restrictions: Yes RLE Weight Bearing: Weight bearing as tolerated Other Position/Activity Restrictions: w/ draco shoe       Mobility Bed Mobility Overal bed mobility: Needs Assistance Bed Mobility: Supine to Sit     Supine to sit: Min guard, HOB elevated     General bed mobility comments: Able to advance BLE towrad EOB. +rail to elevate trunk. Reports dizziness once seated EOB. BP WNL.    Transfers Overall transfer level: Needs assistance Equipment used: Rolling walker (2 wheels) Transfers: Sit to/from Stand Sit to Stand: Min assist, Mod assist Stand pivot transfers: Min assist         General transfer comment: Min A for sit to stand from slightly elevated EOB to RW. Cues for hand placement. Requires increased assist from low surfaces.     Balance Overall balance assessment: Needs assistance Sitting-balance support: Feet supported, No upper extremity supported Sitting balance-Leahy Scale: Fair     Standing balance support: Bilateral upper extremity supported, During functional activity, Single extremity supported Standing balance-Leahy Scale: Poor Standing balance comment: BUE support on RW for mobility.                           ADL either performed or assessed with clinical judgement   ADL Overall ADL's : Needs  assistance/impaired     Grooming: Set up;Sitting Grooming Details (indicate cue type and reason): 2/3 grooming tasks seated in recliner.  Limited by pain and unable to complete in standing at sink level.             Lower Body Dressing: Maximal assistance;Sitting/lateral leans Lower Body Dressing Details (indicate cue type and reason): Max A to don footwear seated EOB. Toilet Transfer: Minimal Cabin crew Details (indicate cue type and reason): Simulated with transfer to recliner. Min A for sit to stand from slightly elevated EOB with cues for hand placement. Slow to rise. Knees/trunk in flexion upon standing requires cues for upright posture.           General ADL Comments: Continues to be limited by pain in RLE. Reports no pain at rest and declining pain meds. Education on premedication prior to activity with therapy.    Extremity/Trunk Assessment              Vision       Perception     Praxis      Cognition Arousal/Alertness: Awake/alert Behavior During Therapy: WFL for tasks assessed/performed Overall Cognitive Status: Within Functional Limits for tasks assessed                                          Exercises      Shoulder Instructions       General Comments      Pertinent Vitals/ Pain       Pain Assessment Pain Assessment: Faces Faces Pain Scale: Hurts even more Pain Location: R foot/leg and hip; incisional sites Pain Descriptors / Indicators: Throbbing, Aching, Discomfort, Operative site guarding, Moaning Pain Intervention(s): Limited activity within patient's tolerance, Monitored during session, Repositioned, Patient requesting pain meds-RN notified  Home Living                                          Prior Functioning/Environment              Frequency  Min 2X/week        Progress Toward Goals  OT Goals(current goals can now be found in the care plan section)  Progress towards OT goals: Not progressing toward goals - comment (Participation limited by pain)  Acute Rehab OT Goals Patient Stated Goal:  To decrease pain. OT Goal Formulation: With patient Time For Goal Achievement: 04/18/21 Potential to Achieve Goals: Good ADL Goals Pt Will Perform Grooming: with supervision;standing Pt Will Perform Upper Body Dressing: with modified independence;sitting Pt Will Perform Lower Body Dressing: with supervision;sit to/from stand Pt Will Transfer to Toilet: with modified independence;ambulating Pt Will Perform Toileting - Clothing Manipulation and hygiene: with modified independence;sit to/from stand  Plan Frequency remains appropriate;Discharge plan needs to be updated    Co-evaluation                 AM-PAC OT "6 Clicks" Daily Activity     Outcome Measure   Help from another person eating meals?: None Help from another person taking care of personal grooming?: A Little Help from another person toileting, which includes using toliet, bedpan, or urinal?: A Little Help from another person bathing (including washing, rinsing, drying)?: A Lot Help from another person to put on and taking off regular upper body  clothing?: A Little Help from another person to put on and taking off regular lower body clothing?: A Lot 6 Click Score: 17    End of Session Equipment Utilized During Treatment: Rolling walker (2 wheels)  OT Visit Diagnosis: Unsteadiness on feet (R26.81);Muscle weakness (generalized) (M62.81);History of falling (Z91.81);Pain Pain - Right/Left: Right Pain - part of body: Leg   Activity Tolerance Patient tolerated treatment well;Patient limited by pain   Patient Left in chair;with call bell/phone within reach;with chair alarm set   Nurse Communication Mobility status;Patient requests pain meds        Time: 3419-3790 OT Time Calculation (min): 24 min  Charges: OT General Charges $OT Visit: 1 Visit OT Treatments $Self Care/Home Management : 8-22 mins $Therapeutic Activity: 8-22 mins  Dekota Shenk H. OTR/L Supplemental OT, Department of rehab services  [phone]  Tyyne Cliett R H. 04/15/2021, 10:51 AM

## 2021-04-15 NOTE — Progress Notes (Signed)
PROGRESS NOTE    [name]  [id number] DOB: Jan 27, 1934 DOA: 03/31/2021 PCP: Raina Mina., MD   Brief Narrative:  88 y.o. male with medical history significant of coronary artery disease, ischemic cardiomyopathy with chronic systolic CHF with prior EF of 25%, history of CABG in 2006, a flutter status post ablation in 2014, VT, also sick sinus syndrome status post pacemaker.  He has been dealing with a right lower extremity callus for several weeks, seen as an outpatient by podiatry.  Over the last 3 to 4 days, he has noticed increased pain to the right lower extremity, increased swelling and redness.  He has a degree of neuropathy and does not feel much.  He was seen in podiatrist office and was sent to the hospital.  Vascular surgery as well as podiatry consulted, arteriogram shows significant right lower extremity arterial disease. Status post right common femoral to anterior tibial artery bypass with distal vein patch angioplasty on 04/12/2021.  Assessment & Plan:   Principal Problem:   Cellulitis and abscess of foot Active Problems:   Abnormal nuclear stress test   CAD (coronary artery disease) with history of CABG   Hx of CABG   Hyperlipidemia   Dizziness   Presence of cardiac pacemaker   Sick sinus syndrome (HCC) status post pacemaker   Chronic combined systolic and diastolic heart failure (HCC)   Macular degeneration   PAD (peripheral artery disease) (HCC)   Paroxysmal atrial flutter (HCC)   Urinary hesitancy   Critical limb ischemia of both lower extremities (HCC)   Rash   Abnormal nuclear cardiac imaging test  Cellulitis and abscess of foot/critical limb ischemia of both lower extremities/PAD- (present on admission) - Patient admitted with right foot cellulitis and abscess.  - Could not get an MRI due to pacemaker.  -initially: pus draining from right greater toe/Cellulitis extends up to mid upper shins.  -antibiotics-- IV vanc/zosyn initially, narrowed to rocephin  but patient developed an itching rash (unclear if related to new abx but timing coincides)- changed back to vanc/zosyn (also redness on IV rocephin seems to have worsened).  Due to no improvement in the rash, antibiotics were transitioned from zosyn/vanc to flagyl/levo.  I will continue both of these medications until 04/16/2021. -Blood cultures: no growth to date. Wound culture skin flora -Vascular surgery consulted, underwent arteriogram 1/23, Status post right common femoral to anterior tibial artery bypass with distal vein patch angioplasty on 04/12/2021.  Doing well in that regard, vascular surgery has cleared him for discharge. -Podiatry consulted as well:.  Bone scan does not show osteomyelitis.  No intervention recommended by podiatry.   Drug/morbilliform rash: Rash has resolved.   Urinary hesitancy - Possibly due to BPH.  This is been going on for quite some time, but most recently has noticed that he needs to urinate more often during the nighttime.  He is supposed to see urology outpatient - started on Flomax while here, no further issues, urinating well   Paroxysmal atrial flutter ([place]) - history of ablation, now has pacemaker.  Apparently he was on Coumadin in the past, and per care everywhere notes, in 2018, his Coumadin was stopped as he had no recurrence for A. fib or flutter for 1.5 years at that time.  He has been started on Coreg by cardiology this admission.   Chronic systolic CHF (congestive heart failure) (HCC)/preoperative risk assessment/history of CAD s/p CABG/PCI -Last seen cardiology at [facility], Dr. Ola Spurr, in 2018.  It was mentioned that the EF  at that time was 40 to 45%, and recently had a stress test which was negative.  2D echo done here shows EF 40-45% with mildly decreased LV function.  Suspect unchanged.  He has no heart failure symptoms, no angina.  Currently he is not on goal-directed therapy, not clear when he stopped the medications but back in 2018 he was on  metoprolol 12.5 twice daily as well as aspirin.  Discussed with the daughter, he has self discontinued the medication as he just does not like to take pills.  Currently appears stable.  Vascular surgery requested cardiac risk assessment.  Patient underwent LHC on 04/11/2021 which showed patent LIMA-LAD, patent SVG-ramus with occlusion of sequential portion of graft to OM 3, occluded SVG-D1, 80% proximal RCA stenosis, 95% distal RCA stenosis. Given lack of symptoms and not wanting to delay intervention for his critical limb ischemia, no PCI done.  If he does develop angina, could consider PCI to proximal and distal RCA.  He has been started on Coreg and losartan which he is tolerating well.  Cardiology has signed off.  Recommendations to discharge on current medications.   Sick sinus syndrome ([place]) status post pacemaker- (present on admission) - Keep on medical telemetry.  He is paced   Dizziness -Chronic    Hyperlipidemia- (present on admission) - Has not been taking statin at home.  LDL at 71  Essential hypertension-resolved as of 04/06/2021 -Monitor blood pressure, currently not taking any medications, has remained within acceptable parameters   Disposition: PT OT recommended CIR.  CIR coordinator had recommended consulting CIR MD.  Consultation request was placed yesterday.  Awaiting assessment and further decision.  If no CIR, patient will be discharged to SNF.  Patient agreeable to either option.  TOC working with family and they have sent some referrals to SNF as a backup option as well.  DVT prophylaxis: SCD's Start: 04/12/21 1705 enoxaparin (LOVENOX) injection 40 mg Start: 04/11/21 2200   Code Status: Full Code  Family Communication:  None present at bedside.  Plan of care discussed with patient in length and he/she verbalized understanding and agreed with it.  Status is: Inpatient  Remains inpatient appropriate because: Medically stable.  Needs discharged to CIR when bed is available  and insurance authorization approved.  Estimated body mass index is 28.41 kg/m as calculated from the following:   Height as of this encounter: 5\' 10"  (1.778 m).   Weight as of this encounter: 89.8 kg.    Nutritional Assessment: Body mass index is 28.41 kg/m.Marland Kitchen Seen by dietician.  I agree with the assessment and plan as outlined below: Nutrition Status:        . Skin Assessment: I have examined the patient's skin and I agree with the wound assessment as performed by the wound care RN as outlined below:    Consultants:  Cardiology Vascular surgery  Procedures:  As above  Antimicrobials:  Anti-infectives (From admission, onward)    Start     Dose/Rate Route Frequency Ordered Stop   04/14/21 2200  metroNIDAZOLE (FLAGYL) tablet 500 mg        500 mg Oral Every 12 hours 04/14/21 1150 04/17/21 0959   04/14/21 1149  levofloxacin (LEVAQUIN) tablet 750 mg        750 mg Oral Daily 04/14/21 1150 04/17/21 0959   04/10/21 1400  levofloxacin (LEVAQUIN) IVPB 500 mg  Status:  Discontinued        500 mg 100 mL/hr over 60 Minutes Intravenous Every 24 hours 04/10/21  1313 04/14/21 1150   04/10/21 1345  metroNIDAZOLE (FLAGYL) IVPB 500 mg  Status:  Discontinued        500 mg 100 mL/hr over 60 Minutes Intravenous Every 12 hours 04/10/21 1246 04/14/21 1150   04/10/21 0000  vancomycin (VANCOREADY) IVPB 1250 mg/250 mL  Status:  Discontinued        1,250 mg 166.7 mL/hr over 90 Minutes Intravenous Every 24 hours 04/09/21 0840 04/10/21 1246   04/09/21 1000  piperacillin-tazobactam (ZOSYN) IVPB 3.375 g  Status:  Discontinued        3.375 g 12.5 mL/hr over 240 Minutes Intravenous Every 8 hours 04/09/21 0840 04/10/21 1246   04/09/21 0930  vancomycin (VANCOREADY) IVPB 1250 mg/250 mL        1,250 mg 166.7 mL/hr over 90 Minutes Intravenous  Once 04/09/21 0810 04/09/21 1109   04/07/21 1400  cefTRIAXone (ROCEPHIN) 2 g in sodium chloride 0.9 % 100 mL IVPB  Status:  Discontinued        2 g 200 mL/hr  over 30 Minutes Intravenous Every 24 hours 04/07/21 1035 04/09/21 0809   04/03/21 1800  vancomycin (VANCOREADY) IVPB 1250 mg/250 mL  Status:  Discontinued        1,250 mg 166.7 mL/hr over 90 Minutes Intravenous Every 24 hours 04/03/21 1455 04/07/21 1035   04/01/21 1830  vancomycin (VANCOREADY) IVPB 1750 mg/350 mL  Status:  Discontinued        1,750 mg 175 mL/hr over 120 Minutes Intravenous Every 24 hours 03/31/21 1632 04/03/21 1455   04/01/21 0630  clindamycin (CLEOCIN) IVPB 600 mg  Status:  Discontinued        600 mg 100 mL/hr over 30 Minutes Intravenous Every 8 hours 04/01/21 0540 04/04/21 1119   04/01/21 0000  piperacillin-tazobactam (ZOSYN) IVPB 3.375 g  Status:  Discontinued        3.375 g 12.5 mL/hr over 240 Minutes Intravenous Every 8 hours 03/31/21 1708 04/07/21 1035   03/31/21 2200  clindamycin (CLEOCIN) IVPB 600 mg  Status:  Discontinued        600 mg 100 mL/hr over 30 Minutes Intravenous Every 8 hours 03/31/21 1935 04/01/21 0540   03/31/21 1945  vancomycin (VANCOCIN) IVPB 1000 mg/200 mL premix  Status:  Discontinued        1,000 mg 200 mL/hr over 60 Minutes Intravenous  Once 03/31/21 1935 03/31/21 1944   03/31/21 1945  piperacillin-tazobactam (ZOSYN) IVPB 3.375 g  Status:  Discontinued        3.375 g 100 mL/hr over 30 Minutes Intravenous  Once 03/31/21 1935 03/31/21 1944   03/31/21 1630  vancomycin (VANCOREADY) IVPB 2000 mg/400 mL        2,000 mg 200 mL/hr over 120 Minutes Intravenous  Once 03/31/21 1619 03/31/21 1935   03/31/21 1600  vancomycin (VANCOREADY) IVPB 1750 mg/350 mL  Status:  Discontinued        1,750 mg 175 mL/hr over 120 Minutes Intravenous  Once 03/31/21 1541 03/31/21 1619   03/31/21 1545  piperacillin-tazobactam (ZOSYN) IVPB 3.375 g        3.375 g 100 mL/hr over 30 Minutes Intravenous  Once 03/31/21 1537 03/31/21 1633         Subjective:  Patient seen and examined.  He is improving and looking much better than yesterday.  The only complaint he has is  pain at the right lower extremity, bypass site and the bypass graft site at right inguinal area.  Both areas look improved with incision dry, clean and  intact with staples.  Objective: Vitals:   04/14/21 2018 04/14/21 2343 04/15/21 0433 04/15/21 0826  BP: 119/68 (!) 102/57 (!) 157/80 133/66  Pulse: 100 65 72 60  Resp: 14 10 16 19   Temp: 97.7 F (36.5 C) 97.8 F (36.6 C) 98 F (36.7 C) 98.3 F (36.8 C)  TempSrc: Oral Oral Oral Oral  SpO2: 100% 98% 100% 96%  Weight:      Height:        Intake/Output Summary (Last 24 hours) at 04/15/2021 1059 Last data filed at 04/15/2021 0827 Gross per 24 hour  Intake 1360 ml  Output 1400 ml  Net -40 ml    Filed Weights   04/11/21 0500 04/13/21 0503 04/14/21 0559  Weight: 87 kg 90.3 kg 89.8 kg    Examination:  General exam: Appears calm and comfortable  Respiratory system: Clear to auscultation. Respiratory effort normal. Cardiovascular system: S1 & S2 heard, RRR. No JVD, murmurs, rubs, gallops or clicks. No pedal edema. Gastrointestinal system: Abdomen is nondistended, soft and nontender. No organomegaly or masses felt. Normal bowel sounds heard. Central nervous system: Alert and oriented. No focal neurological deficits. Extremities: Symmetric 5 x 5 power. Skin: Incision to the right lower extremity with staples appears to be clean, dry and intact with very minimal surrounding erythema which is improved compared to yesterday. Psychiatry: Judgement and insight appear normal. Mood & affect appropriate.   Data Reviewed: I have personally reviewed following labs and imaging studies  CBC: Recent Labs  Lab 04/11/21 0733 04/12/21 0320 04/13/21 0212 04/14/21 0325 04/15/21 0234  WBC 10.7* 7.2 12.0* 6.5 6.5  HGB 13.8 12.9* 11.6* 11.1* 11.9*  HCT 42.2 38.8* 33.8* 33.2* 36.1*  MCV 90.0 89.4 88.9 90.5 90.9  PLT 217 207 207 186 [id number]    Basic Metabolic Panel: Recent Labs  Lab 04/11/21 0733 04/12/21 0320 04/12/21 1733 04/13/21 0212  04/14/21 0325 04/15/21 0234  NA 136 135  --  137 137 135  K 3.9 4.2  --  4.1 3.7 3.7  CL 104 105  --  107 107 104  CO2 21* 21*  --  23 25 25   GLUCOSE 102* 158*  --  149* 106* 99  BUN 14 16  --  21 19 18   CREATININE 0.98 0.89 0.95 0.98 0.95 0.86  CALCIUM 8.5* 8.5*  --  8.3* 8.2* 8.2*    GFR: Estimated Creatinine Clearance: 70.8 mL/min (by C-G formula based on SCr of 0.86 mg/dL). Liver Function Tests: Recent Labs  Lab 04/13/21 0212  AST 33  ALT 27  ALKPHOS 47  BILITOT 0.4  PROT 5.0*  ALBUMIN 2.4*    No results for input(s): LIPASE, AMYLASE in the last 168 hours. No results for input(s): AMMONIA in the last 168 hours. Coagulation Profile: No results for input(s): INR, PROTIME in the last 168 hours. Cardiac Enzymes: No results for input(s): CKTOTAL, CKMB, CKMBINDEX, TROPONINI in the last 168 hours. BNP (last 3 results) No results for input(s): PROBNP in the last 8760 hours. HbA1C: No results for input(s): HGBA1C in the last 72 hours. CBG: No results for input(s): GLUCAP in the last 168 hours. Lipid Profile: Recent Labs    04/13/21 0212  CHOL 83  HDL 27*  LDLCALC 48  TRIG 39  CHOLHDL 3.1    Thyroid Function Tests: No results for input(s): TSH, T4TOTAL, FREET4, T3FREE, THYROIDAB in the last 72 hours. Anemia Panel: No results for input(s): VITAMINB12, FOLATE, FERRITIN, TIBC, IRON, RETICCTPCT in the last 72 hours. Sepsis Labs:  No results for input(s): PROCALCITON, LATICACIDVEN in the last 168 hours.  Recent Results (from the past 240 hour(s))  MRSA Next Gen by PCR, Nasal     Status: None   Collection Time: 04/10/21  1:00 PM   Specimen: Nasal Mucosa; Nasal Swab  Result Value Ref Range Status   MRSA by PCR Next Gen NOT DETECTED NOT DETECTED Final    Comment: (NOTE) The GeneXpert MRSA Assay (FDA approved for NASAL specimens only), is one component of a comprehensive MRSA colonization surveillance program. It is not intended to diagnose MRSA infection nor to  guide or monitor treatment for MRSA infections. Test performance is not FDA approved in patients less than 61 years old. Performed at [facility], [place] [address] [place], [place] [address]       Radiology Studies: No results found.  Scheduled Meds:  aspirin EC  81 mg Oral Daily   carvedilol  3.125 mg Oral BID WC   docusate sodium  100 mg Oral Daily   enoxaparin (LOVENOX) injection  40 mg Subcutaneous Q24H   levofloxacin  750 mg Oral Daily   loratadine  10 mg Oral Daily   losartan  25 mg Oral Daily   melatonin  5 mg Oral QHS   metroNIDAZOLE  500 mg Oral Q12H   pantoprazole  40 mg Oral Daily   sodium chloride flush  3 mL Intravenous Q12H   tamsulosin  0.4 mg Oral QPC supper   Continuous Infusions:  sodium chloride Stopped (04/12/21 1143)   sodium chloride     sodium chloride     magnesium sulfate bolus IVPB       LOS: 14 days   Time spent: 24 minutes  Darliss Cheney, MD [facility]  04/15/2021, 10:59 AM  Please page via Amion and do not message via secure chat for anything urgent. Secure chat can be used for anything non urgent.  How to contact the [facility] Attending or Consulting provider [place] or covering provider during after hours [place], for this patient?  Check the care team in [facility] and look for a) attending/consulting [facility] provider listed and b) the [facility] team listed. Page or secure chat [phone]. Log into [email or web address] and use [place]'s universal password to access. If you do not have the password, please contact the hospital operator. Locate the [facility] provider you are looking for under [facility] and page to a number that you can be directly reached. If you still have difficulty reaching the provider, please page the [facility] (Director on Call) for the Hospitalists listed on amion for assistance.

## 2021-04-15 NOTE — Progress Notes (Signed)
Progress Note  Patient Name: [name] Date of Encounter: 04/15/2021  [facility] Cardiologist: Norman Herrlich, MD   Subjective   Denies any chest pain or dyspnea.   Inpatient Medications    Scheduled Meds:  aspirin EC  81 mg Oral Daily   carvedilol  3.125 mg Oral BID WC   docusate sodium  100 mg Oral Daily   enoxaparin (LOVENOX) injection  40 mg Subcutaneous Q24H   levofloxacin  750 mg Oral Daily   loratadine  10 mg Oral Daily   losartan  25 mg Oral Daily   melatonin  5 mg Oral QHS   metroNIDAZOLE  500 mg Oral Q12H   pantoprazole  40 mg Oral Daily   sodium chloride flush  3 mL Intravenous Q12H   tamsulosin  0.4 mg Oral QPC supper   Continuous Infusions:  sodium chloride Stopped (04/12/21 1143)   sodium chloride     sodium chloride     magnesium sulfate bolus IVPB     PRN Meds: sodium chloride, sodium chloride, acetaminophen, alum & mag hydroxide-simeth, bisacodyl, camphor-menthol, diphenhydrAMINE, guaiFENesin-dextromethorphan, hydrALAZINE, HYDROmorphone (DILAUDID) injection, hydrOXYzine, magnesium sulfate bolus IVPB, ondansetron (ZOFRAN) IV, oxyCODONE-acetaminophen, phenol, potassium chloride, senna-docusate, sodium chloride flush   Vital Signs    Vitals:   04/14/21 2018 04/14/21 2343 04/15/21 0433 04/15/21 0826  BP: 119/68 (!) 102/57 (!) 157/80 133/66  Pulse: 100 65 72 60  Resp: 14 10 16 19   Temp: 97.7 F (36.5 C) 97.8 F (36.6 C) 98 F (36.7 C) 98.3 F (36.8 C)  TempSrc: Oral Oral Oral Oral  SpO2: 100% 98% 100% 96%  Weight:      Height:        Intake/Output Summary (Last 24 hours) at 04/15/2021 0843 Last data filed at 04/15/2021 0827 Gross per 24 hour  Intake 1360 ml  Output 1400 ml  Net -40 ml    Last 3 Weights 04/14/2021 04/13/2021 04/11/2021  Weight (lbs) 198 lb 199 lb 1.2 oz 191 lb 12.8 oz  Weight (kg) 89.812 kg 90.3 kg 87 kg      Telemetry    Atrial paced, ventricular sensed rhythm- Personally Reviewed  ECG    No new ECG- Personally  Reviewed  Physical Exam  Rash significantly improved GEN: No acute distress.   Neck: No JVD Cardiac: RRR, no murmurs Respiratory: Clear to auscultation bilaterally. GI: Soft, nontender, non-distended  MS: No edema; No deformity. Neuro:  Nonfocal  Psych: Normal affect   Labs    High Sensitivity Troponin:  No results for input(s): TROPONINIHS in the last 720 hours.   Chemistry Recent Labs  Lab 04/13/21 0212 04/14/21 0325 04/15/21 0234  NA 137 137 135  K 4.1 3.7 3.7  CL 107 107 104  CO2 23 25 25   GLUCOSE 149* 106* 99  BUN 21 19 18   CREATININE 0.98 0.95 0.86  CALCIUM 8.3* 8.2* 8.2*  PROT 5.0*  --   --   ALBUMIN 2.4*  --   --   AST 33  --   --   ALT 27  --   --   ALKPHOS 47  --   --   BILITOT 0.4  --   --   GFRNONAA >60 >60 >60  ANIONGAP 7 5 6      Lipids  Recent Labs  Lab 04/13/21 0212  CHOL 83  TRIG 39  HDL 27*  LDLCALC 48  CHOLHDL 3.1     Hematology Recent Labs  Lab 04/13/21 604 780 1584 04/14/21 0325  04/15/21 0234  WBC 12.0* 6.5 6.5  RBC 3.80* 3.67* 3.97*  HGB 11.6* 11.1* 11.9*  HCT 33.8* 33.2* 36.1*  MCV 88.9 90.5 90.9  MCH 30.5 30.2 30.0  MCHC 34.3 33.4 33.0  RDW 11.6 11.8 11.8  PLT 207 186 190    Thyroid No results for input(s): TSH, FREET4 in the last 168 hours.  BNPNo results for input(s): BNP, PROBNP in the last 168 hours.  DDimer No results for input(s): DDIMER in the last 168 hours.   Radiology    No results found.  Cardiac Studies   2D echo 04/01/21   1. Global mild to moderate hypokinesis with akinesis of the anterior,  anteroseptal wall and apex c/w LAD disease. Left ventricular ejection  fraction, by estimation, is 40 to 45%. The left ventricle has mildly  decreased function. The left ventricle  demonstrates regional wall motion abnormalities (see scoring  diagram/findings for description). There is mild concentric left  ventricular hypertrophy. Left ventricular diastolic parameters are  indeterminate.   2. Right ventricular  systolic function was not well visualized. The right  ventricular size is not well visualized.   3. Left atrial size was moderately dilated.   4. Right atrium is dilated.   5. The mitral valve is grossly normal. No evidence of mitral valve  regurgitation.   6. The aortic valve is grossly normal. Aortic valve regurgitation is  mild. Aortic valve sclerosis/calcification is present, without any  evidence of aortic stenosis.   7. The inferior vena cava not well visualized.     Lexiscan Myoview 04/07/21:  IMPRESSION: 1. Large fixed defects are seen involving the anterior, apical and inferior myocardium consistent with old infarctions. Mild peri-infarct ischemia is noted anteriorly.   2. Moderate global hypomotility is noted.   3. Left ventricular ejection fraction 34%   4. Non invasive risk stratification*: High   Abdominal aortogram 04/04/21: Findings: Patient has tortuous external iliac arteries.  The common iliac and aorta are calcified.  Renal arteries without any stenoses.  Right SFA is flush occluded there is a large profunda giving many collaterals.  There is an [place] of distal SFA.  He then reconstitutes intertibial artery which flows to the foot.  Posterior tibial artery appears to reconstitute but it occludes above the ankle.  On the left side his common femoral artery is calcified without any flow-limiting stenosis.  SFA is heavily calcified but patent down to the popliteal.  Trifurcation is patent.  All tibial vessels are heavily diseased with posterior tibial being the dominant runoff.  Patient will be considered for right femoral to anterior tibial artery bypass and vein mapping will be ordered.  Patient Profile     88 y.o. 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  Due to severity of PAD plan for right SFA to anterior tibial bypass surgery, with a request for preoperative cardiac clearance.  Assessment & Plan     CAD: s/p CABG/PCI (2006). He was admitted for cellulitis and abscess.  He had ABIs that were abnormal and concerning for critical limb ischemia.  He had a PV angio that showed an occluded right SFA with many collaterals.  It reconstitutes in the intertibial artery and the  tibial artery.  The tibial artery is occluded above the ankle.  He also has left common femoral artery and heavily calcified left SFA that was patent to the level of the popliteal.  He was recommended that he have a right femoral to anterior tibial bypass.  Given that his legs prohibit him from getting much exercise a Lexiscan Myoview was done which showed Large fixed defects are seen involving the anterior, apical and inferior myocardium consistent with old infarctions. Mild peri-infarct ischemia is noted anteriorly.  High risk study.  Echo with EF 40-45%.  LHC showed patent LIMA-LAD, patent SVG-ramus with occlusion of sequential portion of graft to OM 3, occluded SVG-D1, 80% proximal RCA stenosis, 95% distal RCA stenosis. -Discussed cath results with Dr. Okey DupreEnd.  Given lack of symptoms and not wanting to delay intervention for his critical limb ischemia, no PCI done.  If he does develop angina, could consider PCI to proximal and distal RCA. -Continue ASA.  Plan PCSK9 inhibitor evaluation as outpatient.  Chronic combined systolic and diastolic heart failure: EF 40 to 45% on echo 04/01/2021.  Continue coreg and losartan.  Can consider aldactone, SGLT2 inhibitor as outpatient  PAD: Critical limb ischemia, S/p R common femoral to anterior tibial artery bypass 1/31  HLD: LDL 71, goal 55 given CAD,  PAD as above.  He declined statins due to myalgias.  Will need outpatient evaluation for PCSK9 inhibitor   SSS: s/p PPM.     [facility] will sign off.   Medication Recommendations:  Coreg 3.125 mg BID, losartan 25 mg daily, ASA 81 mg daily Other recommendations (labs, testing, etc):  None Follow up as an outpatient:  Will schedule f/u with Dr Dulce SellarMunley and also refer to lipid clinic for PCSK9 inhibitor    For questions or updates, please contact [facility] Please consult [email or web address] for contact info under        Signed, [place] Ishikawah[place] L Imri Lor, MD  04/15/2021, 8:43 AM

## 2021-04-15 NOTE — TOC Progression Note (Signed)
Transition of Care [facility]) - Progression Note    Patient Details  Name: [name] MRN: [id number] Date of Birth: 10/13/1933  Transition [facility]) CM/SW Contact  Eduard Roux, [place] Phone Number: 04/15/2021, 3:14 PM  Clinical Narrative:     CSW spoke with patient's daughter, [name]- informed [facility] in [place] has confirmed bed offer. CSW discussed, CIR was being pursued and but rehab admission coordinator noted she anticipates denial. Edilia Bo, states she disucssed with patient CIR vs SNF and the patient was agreeable to short term rehab at [facility].  CSW informed SNF-family accepted be offer and no longer interested in CIR. Anticipates possible discharge tomorrow.  Updated CIR- family has decided on SNF MD updated , covid test requested  CSW started insurance auth reference # [id number]  [facility] will continue to follow and assist with discharge planning.  Antony Blackbird, MSW, LCSW Clinical Social Worker      Expected Discharge Plan: Home w Home Health Services Barriers to Discharge: Continued Medical Work up  Expected Discharge Plan and Services Expected Discharge Plan: Home w Home Health Services In-house Referral: NA Discharge Planning Services: CM Consult Post Acute Care Choice: Home Health Living arrangements for the past 2 months: Single Family Home                 DME Arranged:  (patietn wants to wait for DME arrangement)                     Social Determinants of Health (SDOH) Interventions    Readmission Risk Interventions No flowsheet data found.

## 2021-04-15 NOTE — Progress Notes (Signed)
Per Dr. Campbell Lerner request I have sent a message to our office's scheduling team requesting a follow-up appointment with Dr .Dulce Sellar in 1 month as well as referral to lipid clinic and our office will call the patient with this information.

## 2021-04-15 NOTE — Progress Notes (Signed)
IP rehab admissions - I received a message from unit SW who spoke with the patent's daughter,Jenne, regarding SNF- the daughter states that she has spoken with the patient and their preference is SNF. Daughter and patient no longer want to pursue CIR.  SW will continue with pursuit of SNF placement.  Call for questions.  [phone]

## 2021-04-16 MED ORDER — CARVEDILOL 3.125 MG PO TABS: 3.1250 mg | ORAL_TABLET | Freq: Two times a day (BID) | ORAL | 0 refills | Status: DC

## 2021-04-16 MED ORDER — LOSARTAN POTASSIUM 25 MG PO TABS: 25.0000 mg | ORAL_TABLET | Freq: Every day | ORAL | 0 refills | Status: DC

## 2021-04-16 MED ORDER — OXYCODONE-ACETAMINOPHEN 5-325 MG PO TABS: 1.0000 | ORAL_TABLET | Freq: Three times a day (TID) | ORAL | 0 refills | Status: DC | PRN

## 2021-04-16 MED ORDER — TAMSULOSIN HCL 0.4 MG PO CAPS: 0.4000 mg | ORAL_CAPSULE | Freq: Every day | ORAL | 0 refills | Status: AC

## 2021-04-16 MED ORDER — ASPIRIN 81 MG PO TBEC: 81.0000 mg | DELAYED_RELEASE_TABLET | Freq: Every day | ORAL | 0 refills | Status: AC

## 2021-04-16 NOTE — Progress Notes (Signed)
Called report to RN Janett Billow and [facility] in [place]. All questions answered. PTAR at bedside to transport patient. Payton Emerald, RN

## 2021-04-16 NOTE — TOC Progression Note (Signed)
Transition of Care [facility]) - Progression Note    Patient Details  Name: [name] MRN: [id number] Date of Birth: 1933-12-16  Transition of Care [facility]) CM/SW [place], [place] Phone Number: [phone] 04/16/2021, 9:44 AM  Clinical Narrative:    Pt's authorization has been approved. Auth daes 2/4- 2/7.Auth ID is still pending.  TOC team will continue to assist with discharge planning needs.    Expected Discharge Plan: [place] Barriers to Discharge: Barriers Resolved  Expected Discharge Plan and Services Expected Discharge Plan: [place] In-house Referral: NA Discharge Planning Services: CM Consult Post Acute Care Choice: [place] arrangements for the past 2 months: Single Family Home Expected Discharge Date: 04/16/21               DME Arranged:  (patietn wants to wait for DME arrangement)                     Social Determinants of Health (SDOH) Interventions    Readmission Risk Interventions No flowsheet data found.

## 2021-04-16 NOTE — Discharge Summary (Signed)
PatientPhysician Discharge Summary  [name] [id number] DOB: 1934-03-11 DOA: 03/31/2021  PCP: Raina Mina., MD  Admit date: 03/31/2021 Discharge date: 04/16/2021 30 Day Unplanned Readmission Risk Score    Flowsheet Row ED to Hosp-Admission (Current) from 03/31/2021 in [facility] 4E CV SURGICAL PROGRESSIVE CARE  30 Day Unplanned Readmission Risk Score (%) 12.64 Filed at 04/16/2021 0800       This score is the patient's risk of an unplanned readmission within 30 days of being discharged (0 -100%). The score is based on dignosis, age, lab data, medications, orders, and past utilization.   Low:  0-14.9   Medium: 15-21.9   High: 22-29.9   Extreme: 30 and above          Admitted From: Home Disposition: SNF  Recommendations for Outpatient Follow-up:  Follow up with PCP in 1-2 weeks Please obtain BMP/CBC in one week Follow-up with vascular surgery in 2 weeks Follow-up with cardiology in 2 to 3 weeks Please follow up with your PCP on the following pending results: Unresulted Labs (From admission, onward)     Start     Ordered   04/18/21 0500  Creatinine, serum  (enoxaparin (LOVENOX)    CrCl >/= 30 ml/min)  Weekly,   R     Comments: while on enoxaparin therapy   Question:  Specimen collection method  Answer:  Lab=Lab collect   04/11/21 [place]: None Equipment/Devices: None  Discharge Condition: Stable CODE STATUS: Full code Diet recommendation: Cardiac  Subjective: Seen and examined.  He has no complaints.  Brief/Interim Summary: 88 y.o. male with medical history significant of coronary artery disease, ischemic cardiomyopathy with chronic systolic CHF with prior EF of 25%, history of CABG in 2006, a flutter status post ablation in 2014, VT, also sick sinus syndrome status post pacemaker.  He has been dealing with a right lower extremity callus for several weeks, seen as an outpatient by podiatry.  Over the last 3 to 4 days, he had noticed increased  pain to the right lower extremity, increased swelling and redness.  He has a degree of neuropathy and does not feel much.  He was seen in podiatrist office and was sent to the hospital.  Detailed hospitalization as below.   Cellulitis and abscess of foot/critical limb ischemia of both lower extremities/PAD- (present on admission) - Could not get an MRI due to pacemaker.  -initially: pus draining from right greater toe/Cellulitis extends up to mid upper shins.  -antibiotics-- IV vanc/zosyn initially, narrowed to rocephin but patient developed an itching rash (unclear if related to new abx but timing coincides)- changed back to vanc/zosyn (also redness on IV rocephin seems to have worsened).  Due to no improvement in the rash, antibiotics were transitioned from zosyn/vanc to flagyl/levo which he will complete 7 days today.  He has been on antibiotics for more than 10 days in total.  No further antibiotics indicated. -Blood cultures: no growth to date. Wound culture skin flora -Vascular surgery consulted, underwent arteriogram 1/23, Status post right common femoral to anterior tibial artery bypass with distal vein patch angioplasty on 04/12/2021.  Doing well in that regard, vascular surgery has cleared him for discharge. -Podiatry consulted as well:.  Bone scan does not show osteomyelitis.  No intervention recommended by podiatry.   Drug/morbilliform rash: Rash has resolved.   Urinary hesitancy - Possibly due to BPH.  This is been going on for quite some  time, but most recently has noticed that he needs to urinate more often during the nighttime.  He is supposed to see urology outpatient - started on Flomax while here, no further issues, urinating well, discharging on Flomax.   Paroxysmal atrial flutter (HCC) - history of ablation, now has pacemaker.  Apparently he was on Coumadin in the past, and per care everywhere notes, in 2018, his Coumadin was stopped as he had no recurrence for A. fib or flutter  for 1.5 years at that time.  He has been started on Coreg and aspirin by cardiology this admission.   Chronic systolic CHF (congestive heart failure) (HCC)/preoperative risk assessment/history of CAD s/p CABG/PCI -Last seen cardiology at [facility], Dr. Ola Spurr, in 2018.  It was mentioned that the EF at that time was 40 to 45%, and recently had a stress test which was negative.  2D echo done here shows EF 40-45% with mildly decreased LV function.  Suspect unchanged.  He has no heart failure symptoms, no angina.  Currently he is not on goal-directed therapy, not clear when he stopped the medications but back in 2018 he was on metoprolol 12.5 twice daily as well as aspirin.  Discussed with the daughter, he has self discontinued the medication as he just does not like to take pills.  Currently appears stable.  Vascular surgery requested cardiac risk assessment.  Patient underwent LHC on 04/11/2021 which showed patent LIMA-LAD, patent SVG-ramus with occlusion of sequential portion of graft to OM 3, occluded SVG-D1, 80% proximal RCA stenosis, 95% distal RCA stenosis. Given lack of symptoms and not wanting to delay intervention for his critical limb ischemia, no PCI done.  If he does develop angina, could consider PCI to proximal and distal RCA.  He has been started on Coreg, aspirin and losartan which he is tolerating well.  Cardiology has signed off.  Recommendations to discharge on current medications.   Sick sinus syndrome ([place]) status post pacemaker- (present on admission) - Keep on medical telemetry.  He is paced   Dizziness -Chronic    Hyperlipidemia- (present on admission) - Has not been taking statin at home.  LDL at 71   Essential hypertension-resolved as of 04/06/2021 -Monitor blood pressure, currently not taking any medications, has remained within acceptable parameters    Disposition: PT OT recommended CIR.  CIR coordinator assist patient and did not think that patient's insurance will approve.  In  the meantime, patient and family decided to pursue SNF which has been arranged for him so he is being discharged in stable condition.  Discharge plan was discussed with patient and/or family member and they verbalized understanding and agreed with it.  Discharge Diagnoses:  Principal Problem:   Cellulitis and abscess of foot Active Problems:   Abnormal nuclear stress test   CAD (coronary artery disease) with history of CABG   Hx of CABG   Hyperlipidemia   Dizziness   Presence of cardiac pacemaker   Sick sinus syndrome (HCC) status post pacemaker   Chronic combined systolic and diastolic heart failure (HCC)   Macular degeneration   PAD (peripheral artery disease) (HCC)   Paroxysmal atrial flutter (HCC)   Urinary hesitancy   Critical limb ischemia of both lower extremities (HCC)   Rash   Abnormal nuclear cardiac imaging test    Discharge Instructions  Discharge Instructions     AMB Referral to [facility]   Complete by: As directed    Reason For Referral: Lipids   Culture, blood (routine x 2)  Complete by: As directed       Allergies as of 04/16/2021       Reactions   Amiodarone Nausea And Vomiting   vomiting   Gabapentin    dizziness   Metoprolol Other (See Comments)   Drop in heart rate, dizziness.    Statins    myalgias   Doxycycline    Unknown to patient   Neomycin    Unknown to patient   Niacin    Unknown to patient   Ramipril    Unknown to patient   Aleve [naproxen] Other (See Comments)   Makes him feel drunk. He does not want it.    Rocephin [ceftriaxone] Rash   Whole body rash        Medication List     STOP taking these medications    ibuprofen 200 MG tablet Commonly known as: ADVIL       TAKE these medications    acetaminophen 500 MG tablet Commonly known as: TYLENOL Take 500 mg by mouth every 6 (six) hours as needed for mild pain.   aspirin 81 MG EC tablet Take 1 tablet (81 mg total) by mouth daily. Swallow whole.    carvedilol 3.125 MG tablet Commonly known as: COREG Take 1 tablet (3.125 mg total) by mouth 2 (two) times daily with a meal.   hydroxypropyl methylcellulose / hypromellose 2.5 % ophthalmic solution Commonly known as: ISOPTO TEARS / GONIOVISC Place 1 drop into both eyes daily as needed for dry eyes.   losartan 25 MG tablet Commonly known as: COZAAR Take 1 tablet (25 mg total) by mouth daily.   oxyCODONE-acetaminophen 5-325 MG tablet Commonly known as: PERCOCET/ROXICET Take 1-2 tablets by mouth every 8 (eight) hours as needed for moderate pain.   PROSTATE PO Take 1 tablet by mouth daily.   tamsulosin 0.4 MG Caps capsule Commonly known as: FLOMAX Take 1 capsule (0.4 mg total) by mouth daily after supper.        Follow-up Information     [facility] Follow up in 3 week(s).   Specialty: Vascular Surgery Why: Office will call you to arrange your appt (sent) Contact information: [address] [place] [address] [phone]        Baldo Daub, MD Follow up.   Specialties: Cardiology, Radiology Why: Dr. Hulen Shouts office will call you to arrange a follow-up visit and referral to our cholesterol management clinic. Please call the office if you had not heard from them within 3 days of discharge. Contact information: [address] [place] [address] [phone]         Gordan Payment., MD Follow up in 1 week(s).   Specialty: Internal Medicine Contact information: [address] [place] [address] [phone]         Baldo Daub, MD .   Specialties: Cardiology, Radiology Contact information: [address] [place] [address] [phone]         Regan Lemming, MD .   Specialty: Cardiology Contact information: [address] [place] [address] [phone]                Allergies  Allergen Reactions   Amiodarone Nausea And Vomiting    vomiting     Gabapentin     dizziness     Metoprolol Other (See Comments)    Drop in heart rate, dizziness.     Statins     myalgias   Doxycycline  Unknown to patient    Neomycin     Unknown to patient     Niacin     Unknown to patient    Ramipril     Unknown to patient     Aleve [Naproxen] Other (See Comments)    Makes him feel drunk. He does not want it.    Rocephin [Ceftriaxone] Rash    Whole body rash    Consultations: Cardiology and vascular surgery   Procedures/Studies: CARDIAC CATHETERIZATION  Result Date: 04/11/2021 Conclusions: Severe native coronary artery disease, including chronic total occlusions of mid/distal LMCA, mid LAD (site of prior PCI), and ostial rPDA.  There is also irregular mid LAD disease beyond the occluded stent of up to 50%, sequential 30-70% proximal through distal LCx lesions, and sequential 80% proximal and 95% distal RCA stenoses. Widely patent LIMA-LAD. Patent SVG-ramus intermedius with occlusion of sequential portion of the graft to OM3.  SVG-ramus backfills the LCx and proximal LAD. Chronically occluded SVG-D1. Mildly elevated left ventricular filling pressure (LVEDP 18 mmHg). Recommendations: Images reviewed with Dr. Gardiner Rhyme.  Given lack of angina and minimal periinfarct ischemia on recent stress test, medical management is preferred.  I doubt that PCI to proximal and distal RCA would significantly alter the patient's perioperative cardiac risk for planned fem-tib bypass tomorrow but could delay intervention aimed at treating his critical limb ischemia. If Mr. Vanepps develops angina, PCI to proximal and distal RCA could be considered. Aggressive secondary prevention.  PCSK9 inhibitor should be considered as an outpatient, given history of statin intolerance. Nelva Bush, MD [facility]  CT ANKLE RIGHT W CONTRAST  Result Date: 03/31/2021 CLINICAL DATA:  Foot swelling and diabetes. Osteomyelitis. Soft tissue mass. EXAM: CT OF THE RIGHT ANKLE WITH  CONTRAST TECHNIQUE: Multidetector CT imaging of the right ankle was performed following the standard protocol during bolus administration of intravenous contrast. RADIATION DOSE REDUCTION: This exam was performed according to the departmental dose-optimization program which includes automated exposure control, adjustment of the mA and/or kV according to patient size and/or use of iterative reconstruction technique. CONTRAST:  24mL OMNIPAQUE IOHEXOL 300 MG/ML  SOLN COMPARISON:  03/31/2021 radiograph FINDINGS: Bones/Joint/Cartilage No bony destructive findings characteristic of osteomyelitis. Plantar calcaneal spur. Moderate spurring and degenerative subcortical cyst formation along the posterior subtalar joint. Moderate marginal spurring at the tibiotalar articulation. Mild tibiotalar valgus angulation and mild degenerative midfoot spurring and loss of articular space. Ligaments Suboptimally assessed by CT. Muscles and Tendons Spurring along the distal tibia may be somewhat impinging on the flexor digitorum longus tendon for example on image 37 series 2. Reduced size and indistinctness of the tibialis posterior tendon, potentially from partial tearing or tendinopathy. Soft tissues Circumferential subcutaneous edema in the ankle. No appreciable gas in the soft tissues the ankle. No drainable abscess in the ankle region identified. No substantial joint effusion. IMPRESSION: 1. No findings of osteomyelitis or septic joint involving the ankle. 2. Notable degenerative findings in the hindfoot and midfoot. 3. Reduced size and indistinctness of the tibialis posterior tendon which could reflect partial tearing or tendinopathy. 4. Mild tibiotalar valgus angulation. 5. Posterior spurring from the distal tibia may be somewhat impinging on the flexor digitorum longus tendon. 6. Circumferential infiltrative subcutaneous edema in the ankle. Electronically Signed   By: Van Clines M.D.   On: 03/31/2021 18:58   CT FOOT  RIGHT W CONTRAST  Result Date: 03/31/2021 CLINICAL DATA:  Foot swelling.  Diabetes.  Possible osteomyelitis. EXAM: CT OF THE LOWER RIGHT EXTREMITY WITH CONTRAST TECHNIQUE:  Multidetector CT imaging of the lower right extremity was performed according to the standard protocol following intravenous contrast administration. RADIATION DOSE REDUCTION: This exam was performed according to the departmental dose-optimization program which includes automated exposure control, adjustment of the mA and/or kV according to patient size and/or use of iterative reconstruction technique. CONTRAST:  20mL OMNIPAQUE IOHEXOL 300 MG/ML  SOLN COMPARISON:  Foot radiographs 03/31/2021 FINDINGS: Bones/Joint/Cartilage Notable degenerative findings in the hindfoot and midfoot as described in ankle CT report. No bony destructive findings characteristic of osteomyelitis. No fracture is identified. Mild degenerative findings along the Lisfranc joint and at the first MTP joint. Likewise mild degenerative findings at the articulation of first metatarsal head in the sesamoids. Ligaments Suboptimally assessed by CT. Muscles and Tendons Unremarkable Soft tissues Substantial subcutaneous edema tracks in the dorsum of the foot and cellulitis is not excluded. I do not see a drainable abscess. Medially and along the plantar side of the great toe there is substantial focal subcutaneous edema and enhancement favoring cellulitis. Cutaneous irregularity with a small amount of gas along an ulceration in the medial great toe is noted, corresponding to the appearance on radiography. I do not see definite gas in the interphalangeal joint or a definite interphalangeal joint effusion although tissue planes are effaced in the vicinity of the ulceration. I do not see a definite drainable abscess. IMPRESSION: 1. Substantial medial ulceration of the great toe with enhancement in the surrounding subcutaneous tissues indicating cellulitis. No bony destructive findings  to indicate osteomyelitis. No drainable abscess identified. 2. Dorsal subcutaneous edema along the forefoot, cellulitis is not excluded. 3. Degenerative findings along the first MTP joint. Electronically Signed   By: Van Clines M.D.   On: 03/31/2021 19:02   PERIPHERAL VASCULAR CATHETERIZATION  Result Date: 04/04/2021 Images from the original result were not included. Patient name: TAJIRI SEKEL MRN: [id number] DOB: 1933/07/03 Sex: male 04/04/2021 Pre-operative Diagnosis: Chronic right lower extremity limb threatening ischemia with toe ulceration Post-operative diagnosis:  Same Surgeon:  Erlene Quan C. Donzetta Matters, MD Procedure Performed: 1.  Ultrasound-guided cannulation left common femoral artery 2.  Aortogram 3.  Selection of right common femoral artery and right lower extremity angiogram 4.  Left lower extremity angiography 5.  Moderate sedation with fentanyl and Versed for 37 minutes 6.  Mynx device closure left common femoral artery Indications: 88 year old male without previous vascular disease now has right great toe ulceration with severely decreased ABIs and toe pressures of 0.  He is indicated for aortogram with right lower extremity angiography possible intervention. Findings: Patient has tortuous external iliac arteries.  The common iliac and aorta are calcified.  Renal arteries without any stenoses.  Right SFA is flush occluded there is a large profunda giving many collaterals.  There is an [place] of distal SFA.  He then reconstitutes intertibial artery which flows to the foot.  Posterior tibial artery appears to reconstitute but it occludes above the ankle.  On the left side his common femoral artery is calcified without any flow-limiting stenosis.  SFA is heavily calcified but patent down to the popliteal.  Trifurcation is patent.  All tibial vessels are heavily diseased with posterior tibial being the dominant runoff. Patient will be considered for right femoral to anterior tibial artery bypass and vein  mapping will be ordered.  Procedure:  The patient was identified in the holding area and taken to room 8.  The patient was then placed supine on the table and prepped and draped in the usual sterile fashion.  A time out was called.  Ultrasound was used to evaluate the left common femoral artery which was noted be patent.  The areas anesthetized 1% lidocaine cannulated micropuncture needle followed by wire and sheath.  Images saved to the permanent record.  Moderate sedation was administered with fentanyl and Versed and his vital signs were monitored throughout the case.  A Bentson wire was placed followed by 5 [place] sheath.  Omni catheter was placed at the level of L1 and aortogram was performed.  Crossover catheter was used to cross the bifurcation with Bentson wire and this was placed into the right common femoral artery and right lower extremity angiography was performed.  With the above findings we remove the catheter over the wire.  We then performed left lower extremity angiography through the existing sheath.  Minx device was then used to close the common femoral access site.  He tolerated procedure without any complication. Contrast: 90cc Brandon C. Donzetta Matters, MD [facility] Office: [phone] Pager: [phone]   NM Myocar Multi W/Spect Tamela Oddi Motion / EF  Result Date: 04/07/2021 CLINICAL DATA:  Preop for noncardiac surgery.  Chest pain. EXAM: MYOCARDIAL IMAGING WITH SPECT (REST AND PHARMACOLOGIC-STRESS) GATED LEFT VENTRICULAR WALL MOTION STUDY LEFT VENTRICULAR EJECTION FRACTION TECHNIQUE: Standard myocardial SPECT imaging was performed after resting intravenous injection of 10.3 mCi Tc-8m tetrofosmin. Subsequently, intravenous infusion of Lexiscan was performed under the supervision of the Cardiology staff. At peak effect of the drug, 30.1 mCi Tc-81m tetrofosmin was injected intravenously and standard myocardial SPECT imaging was performed. Quantitative gated imaging was  also performed to evaluate left ventricular wall motion, and estimate left ventricular ejection fraction. COMPARISON:  None. FINDINGS: Perfusion: Large fixed defects are seen involving the anterior, apical and inferior myocardium consistent with old infarctions. Mild reversibility is seen involving the margins of the defect in the anterior myocardium consistent with peri-infarct ischemia. Wall Motion: Moderate global hypomotility is noted. Left Ventricular Ejection Fraction: 34 % End diastolic volume 123XX123 ml End systolic volume 123XX123 ml IMPRESSION: 1. Large fixed defects are seen involving the anterior, apical and inferior myocardium consistent with old infarctions. Mild peri-infarct ischemia is noted anteriorly. 2. Moderate global hypomotility is noted. 3. Left ventricular ejection fraction 34% 4. Non invasive risk stratification*: High *2012 Appropriate Use Criteria for Coronary Revascularization Focused Update: J Am Coll Cardiol. [id number]. http://content.airportbarriers.com.aspx?articleid=1201161 Electronically Signed   By: Marijo Conception M.D.   On: 04/07/2021 15:46   DG Foot Complete Right  Result Date: 03/31/2021 CLINICAL DATA:  Swelling and redness EXAM: RIGHT FOOT COMPLETE - 3+ VIEW COMPARISON:  Right foot radiographs 05/12/2020 FINDINGS: There is no acute fracture or dislocation. Deformity of the second metatarsal is suggestive of prior trauma, unchanged. Bony alignment is normal. The Lisfranc and Chopart joints are intact. There is no erosive change. The joint spaces are preserved. There is inferior calcaneal spurring. There is a soft tissue defect along the lateral aspect of the great toe adjacent to the interphalangeal joint. Small locules of lucency may reflect soft tissue gas. There is no destruction of the underlying bone. There is diffuse soft tissue swelling particularly over the dorsum of the foot. Vascular calcifications are noted. IMPRESSION: Soft tissue defect along the lateral  aspect of the great toe adjacent to the interphalangeal joint with small locules of lucency which may reflect soft tissue gas. Infection with a gas-forming organism is not excluded. There is no underlying osseous erosion or destruction to suggest definite osteomyelitis. Electronically Signed   By:  Valetta Mole M.D.   On: 03/31/2021 14:40   NM Bone Scan 3 Phase Lower Extremity  Result Date: 04/06/2021 CLINICAL DATA:  Right foot pain and swelling.  Diabetic. EXAM: NUCLEAR MEDICINE 3-PHASE BONE SCAN TECHNIQUE: Radionuclide angiographic images, immediate static blood pool images, and 3-hour delayed static images were obtained of the bilateral lower extremities after intravenous injection of radiopharmaceutical. RADIOPHARMACEUTICALS:  21.3 mCi Tc-15m MDP IV COMPARISON:  CT scan 03/31/2021 FINDINGS: Vascular phase: Slight asymmetric increased blood flow to the right forefoot. Blood pool phase: Asymmetric increased activity in the right toes compared to the left could suggest inflammation or synovitis. Delayed phase: Significant asymmetric increased uptake involving the left ankle and left midfoot likely due to degenerative disease. No areas of abnormal uptake in the right midfoot or forefoot to suggest osteomyelitis. IMPRESSION: 1. Slight asymmetric increased blood flow and increased blood pool activity in the right forefoot/toes suggesting inflammation or synovitis. 2. No increased uptake on delayed images in the right forefoot to suggest osteomyelitis. 3. Asymmetric increased uptake in the left ankle and midfoot, likely degenerative change. Electronically Signed   By: Marijo Sanes M.D.   On: 04/06/2021 08:09   VAS [place] ABI WITH/WO TBI  Result Date: 03/22/2021  LOWER EXTREMITY DOPPLER STUDY Patient Name:  LADARIUS MASSARI  Date of Exam:   03/22/2021 Medical Rec #: [id number]      Accession #:    [id number] Date of Birth: 09/02/1933       Patient Gender: M Patient Age:   12 years Exam [facility] Procedure:      VAS [place] ABI WITH/WO TBI Referring Phys: --------------------------------------------------------------------------------  Indications: Ulceration involving the right great toe with lower leg redness and              loss of pedal pulses, unknown duration. High Risk Factors: Hyperlipidemia, coronary artery disease.  Performing Technologist: Ronal Fear RVS, RCS  Examination Guidelines: A complete evaluation includes at minimum, Doppler waveform signals and systolic blood pressure reading at the level of bilateral brachial, anterior tibial, and posterior tibial arteries, when vessel segments are accessible. Bilateral testing is considered an integral part of a complete examination. Photoelectric Plethysmograph (PPG) waveforms and toe systolic pressure readings are included as required and additional duplex testing as needed. Limited examinations for reoccurring indications may be performed as noted.  ABI Findings: +---------+------------------+-----+-------------------+--------+  Right     Rt Pressure (mmHg) Index Waveform            Comment   +---------+------------------+-----+-------------------+--------+  Brachial  162                                                    +---------+------------------+-----+-------------------+--------+  PTA       48                 0.29  dampened monophasic           +---------+------------------+-----+-------------------+--------+  DP                                 absent                        +---------+------------------+-----+-------------------+--------+  Great Toe  Absent                        +---------+------------------+-----+-------------------+--------+ +---------+------------------+-----+---------+-------+  Left      Lt Pressure (mmHg) Index Waveform  Comment  +---------+------------------+-----+---------+-------+  Brachial  166                                          +---------+------------------+-----+---------+-------+  PTA       162                0.98  triphasic          +---------+------------------+-----+---------+-------+  DP        148                0.89  biphasic           +---------+------------------+-----+---------+-------+  Great Toe 37                 0.22                     +---------+------------------+-----+---------+-------+ +-------+-----------+-----------+------------+------------+  ABI/TBI Today's ABI Today's TBI Previous ABI Previous TBI  +-------+-----------+-----------+------------+------------+  Right   0.29        0.00                                   +-------+-----------+-----------+------------+------------+  Left    0.98        0.22                                   +-------+-----------+-----------+------------+------------+  Patient returning 03/25/2021 for MD appt.  Summary: Right: Resting right ankle-brachial index indicates critical limb ischemia. Left: Resting left ankle-brachial index is within normal range. No evidence of significant left lower extremity arterial disease. The left toe-brachial index is abnormal.  *See table(s) above for measurements and observations.  Electronically signed by Monica Martinez MD on 03/22/2021 at 12:27:12 PM.    Final    VAS [place] LOWER EXTREMITY SAPHENOUS VEIN MAPPING  Result Date: 04/05/2021 LOWER EXTREMITY VEIN MAPPING Patient Name:  RAUNAK PIERPOINT  Date of Exam:   04/05/2021 Medical Rec #: [id number]      Accession #:    [id number] Date of Birth: Nov 23, 1933       Patient Gender: M Patient Age:   28 years Exam Location:  [facility] Procedure:      VAS [place] LOWER EXTREMITY SAPHENOUS VEIN MAPPING Referring Phys: Servando Snare --------------------------------------------------------------------------------  Indications: Pre-op  Comparison Study: No prior study Performing Technologist: Maudry Mayhew MHA, RDMS, RVT, RDCS  Examination Guidelines: A complete evaluation includes B-mode imaging, spectral  Doppler, color Doppler, and power Doppler as needed of all accessible portions of each vessel. Bilateral testing is considered an integral part of a complete examination. Limited examinations for reoccurring indications may be performed as noted. +----------------+--------------+-----------------------+  RT Diameter (cm)  RT Findings             GSV            +----------------+--------------+-----------------------+        0.80                      Saphenofemoral Junction  +----------------+--------------+-----------------------+  0.34                          Proximal thigh       +----------------+--------------+-----------------------+        0.04                             Mid thigh         +----------------+--------------+-----------------------+                   not visualized      Distal thigh        +----------------+--------------+-----------------------+        0.12                               Knee            +----------------+--------------+-----------------------+        0.16                             Prox calf         +----------------+--------------+-----------------------+        0.21         branching           Mid calf          +----------------+--------------+-----------------------+        0.19                            Distal calf        +----------------+--------------+-----------------------+                   not visualized          Ankle           +----------------+--------------+-----------------------+ +----------------+-----------+---------------+  RT diameter (cm) RT Findings       SSV        +----------------+-----------+---------------+        0.10                   Popliteal fossa  +----------------+-----------+---------------+        0.13                    Proximal calf   +----------------+-----------+---------------+        0.17                      Mid calf      +----------------+-----------+---------------+        0.15        branching    Distal calf     +----------------+-----------+---------------+ Diagnosing physician: Deitra Mayo MD Electronically signed by Deitra Mayo MD on 04/05/2021 at 2:19:48 PM.    Final    ECHOCARDIOGRAM COMPLETE  Result Date: 04/01/2021    ECHOCARDIOGRAM REPORT   Patient Name:   SHEP FADNESS Date of Exam: 04/01/2021 Medical Rec #:  [id number]     Height:       70.0 in Accession #:    [id number]    Weight:       185.0 lb Date of Birth:  06/16/1933      BSA:          2.019 m Patient Age:    4 years  BP:           158/89 mmHg Patient Gender: M             HR:           66 bpm. Exam Location:  Inpatient Procedure: 2D Echo, Cardiac Doppler, Color Doppler and Intracardiac            Opacification Agent Indications:    R94.31 Abnormal EKG  History:        Patient has no prior history of Echocardiogram examinations.                 CHF, CAD and Previous Myocardial Infarction, Arrythmias:RBBB and                 Atrial Flutter; Risk Factors:Hypertension.  Sonographer:    Glo Herring Referring Phys: [place]  Sonographer Comments: Suboptimal apical window. IMPRESSIONS  1. Global mild to moderate hypokinesis with akinesis of the anterior, anteroseptal wall and apex c/w LAD disease. Left ventricular ejection fraction, by estimation, is 40 to 45%. The left ventricle has mildly decreased function. The left ventricle demonstrates regional wall motion abnormalities (see scoring diagram/findings for description). There is mild concentric left ventricular hypertrophy. Left ventricular diastolic parameters are indeterminate.  2. Right ventricular systolic function was not well visualized. The right ventricular size is not well visualized.  3. Left atrial size was moderately dilated.  4. Right atrium is dilated.  5. The mitral valve is grossly normal. No evidence of mitral valve regurgitation.  6. The aortic valve is grossly normal. Aortic valve regurgitation is mild. Aortic valve sclerosis/calcification is present,  without any evidence of aortic stenosis.  7. The inferior vena cava not well visualized. Comparison(s): No prior Echocardiogram. FINDINGS  Left Ventricle: Global mild to moderate hypokinesis with akinesis of the anterior, anteroseptal wall and apex c/w LAD disease. Left ventricular ejection fraction, by estimation, is 40 to 45%. The left ventricle has mildly decreased function. The left ventricle demonstrates regional wall motion abnormalities. Definity contrast agent was given IV to delineate the left ventricular endocardial borders. The left ventricular internal cavity size was normal in size. There is mild concentric left ventricular  hypertrophy. Left ventricular diastolic parameters are indeterminate. Right Ventricle: The right ventricular size is not well visualized. Right vetricular wall thickness was not well visualized. Right ventricular systolic function was not well visualized. Left Atrium: Left atrial size was moderately dilated. Right Atrium: Right atrium is dilated. Pericardium: There is no evidence of pericardial effusion. Mitral Valve: The mitral valve is grossly normal. No evidence of mitral valve regurgitation. MV peak gradient, 2.9 mmHg. The mean mitral valve gradient is 1.0 mmHg. Tricuspid Valve: The tricuspid valve is grossly normal. Tricuspid valve regurgitation is trivial. Aortic Valve: The aortic valve is grossly normal. Aortic valve regurgitation is mild. Aortic valve sclerosis/calcification is present, without any evidence of aortic stenosis. Aortic valve mean gradient measures 4.0 mmHg. Aortic valve peak gradient measures 7.5 mmHg. Aortic valve area, by VTI measures 2.32 cm. Pulmonic Valve: The pulmonic valve was grossly normal. Pulmonic valve regurgitation is not visualized. Aorta: The aortic root and ascending aorta are structurally normal, with no evidence of dilitation. Venous: The inferior vena cava not well visualized. IAS/Shunts: The interatrial septum was not well visualized.   LEFT VENTRICLE PLAX 2D LVIDd:         4.50 cm      Diastology LVIDs:         3.40 cm  LV e' medial:    4.13 cm/s LV PW:         1.10 cm      LV E/e' medial:  18.5 LV IVS:        1.10 cm      LV e' lateral:   6.45 cm/s LVOT diam:     2.00 cm      LV E/e' lateral: 11.8 LV SV:         57 LV SV Index:   28 LVOT Area:     3.14 cm  LV Volumes (MOD) LV vol d, MOD A2C: 156.0 ml LV vol d, MOD A4C: 164.0 ml LV vol s, MOD A2C: 76.4 ml LV vol s, MOD A4C: 91.1 ml LV SV MOD A2C:     79.6 ml LV SV MOD A4C:     164.0 ml LV SV MOD BP:      76.3 ml RIGHT VENTRICLE RV S prime:     7.65 cm/s LEFT ATRIUM             Index LA diam:        5.10 cm 2.53 cm/m LA Vol (A2C):   64.0 ml 31.69 ml/m LA Vol (A4C):   73.6 ml 36.45 ml/m LA Biplane Vol: 72.5 ml 35.90 ml/m  AORTIC VALVE                    PULMONIC VALVE AV Area (Vmax):    2.19 cm     PV Vmax:       1.00 m/s AV Area (Vmean):   2.19 cm     PV Peak grad:  4.0 mmHg AV Area (VTI):     2.32 cm AV Vmax:           137.00 cm/s AV Vmean:          89.900 cm/s AV VTI:            0.246 m AV Peak Grad:      7.5 mmHg AV Mean Grad:      4.0 mmHg LVOT Vmax:         95.30 cm/s LVOT Vmean:        62.700 cm/s LVOT VTI:          0.182 m LVOT/AV VTI ratio: 0.74  AORTA Ao Root diam: 3.50 cm Ao Asc diam:  3.40 cm MITRAL VALVE               TRICUSPID VALVE MV Area (PHT): 4.31 cm    TR Peak grad:   30.9 mmHg MV Area VTI:   2.13 cm    TR Vmax:        278.00 cm/s MV Peak grad:  2.9 mmHg MV Mean grad:  1.0 mmHg    SHUNTS MV Vmax:       0.85 m/s    Systemic VTI:  0.18 m MV Vmean:      48.0 cm/s   Systemic Diam: 2.00 cm MV Decel Time: 176 msec MV E velocity: 76.30 cm/s MV A velocity: 73.70 cm/s MV E/A ratio:  1.04 Phineas Inches Electronically signed by Phineas Inches Signature Date/Time: 04/01/2021/10:20:26 AM    Final      Discharge Exam: Vitals:   04/16/21 0414 04/16/21 0758  BP: (!) 113/51 130/61  Pulse: 73 69  Resp: 16 18  Temp: 97.7 F (36.5 C) 98.1 F (36.7 C)  SpO2: 98% 99%   Vitals:    04/15/21 2024 04/15/21 2312 04/16/21 0414  04/16/21 0758  BP: 120/68 (!) 100/59 (!) 113/51 130/61  Pulse: 87 61 73 69  Resp: 19 16 16 18   Temp: 98.1 F (36.7 C) 97.9 F (36.6 C) 97.7 F (36.5 C) 98.1 F (36.7 C)  TempSrc: Oral Oral Oral Oral  SpO2: 98% 98% 98% 99%  Weight:      Height:        General: Pt is alert, awake, not in acute distress, obese Cardiovascular: RRR, S1/S2 +, no rubs, no gallops Respiratory: CTA bilaterally, no wheezing, no rhonchi Abdominal: Soft, NT, ND, bowel sounds + Extremities: no edema, no cyanosis    The results of significant diagnostics from this hospitalization (including imaging, microbiology, ancillary and laboratory) are listed below for reference.     Microbiology: Recent Results (from the past 240 hour(s))  MRSA Next Gen by PCR, Nasal     Status: None   Collection Time: 04/10/21  1:00 PM   Specimen: Nasal Mucosa; Nasal Swab  Result Value Ref Range Status   MRSA by PCR Next Gen NOT DETECTED NOT DETECTED Final    Comment: (NOTE) The GeneXpert MRSA Assay (FDA approved for NASAL specimens only), is one component of a comprehensive MRSA colonization surveillance program. It is not intended to diagnose MRSA infection nor to guide or monitor treatment for MRSA infections. Test performance is not FDA approved in patients less than 2 years old. Performed at [facility], [place] [address], [place], [place] [address]   SARS CORONAVIRUS 2 (TAT 6-24 HRS) Nasopharyngeal Nasopharyngeal Swab     Status: None   Collection Time: 04/15/21  2:15 PM   Specimen: Nasopharyngeal Swab  Result Value Ref Range Status   SARS Coronavirus 2 NEGATIVE NEGATIVE Final    Comment: (NOTE) SARS-CoV-2 target nucleic acids are NOT DETECTED.  The SARS-CoV-2 RNA is generally detectable in upper and lower respiratory specimens during the acute phase of infection. Negative results do not preclude SARS-CoV-2 infection, do not rule out co-infections with other  pathogens, and should not be used as the sole basis for treatment or other patient management decisions. Negative results must be combined with clinical observations, patient history, and epidemiological information. The expected result is Negative.  Fact Sheet for Patients: SugarRoll.be  Fact Sheet for Healthcare Providers: https://www.woods-mathews.com/  This test is not yet approved or cleared by the [place] FDA and  has been authorized for detection and/or diagnosis of SARS-CoV-2 by FDA under an Emergency Use Authorization (EUA). This EUA will remain  in effect (meaning this test can be used) for the duration of the COVID-19 declaration under Se ction 564(b)(1) of the Act, 21 U.S.C. section 360bbb-3(b)(1), unless the authorization is terminated or revoked sooner.  Performed at [facility], [place] [address], [place], [place] [address]      Labs: BNP (last 3 results) No results for input(s): BNP in the last 8760 hours. Basic Metabolic Panel: Recent Labs  Lab 04/11/21 0733 04/12/21 0320 04/12/21 1733 04/13/21 0212 04/14/21 0325 04/15/21 0234  NA 136 135  --  137 137 135  K 3.9 4.2  --  4.1 3.7 3.7  CL 104 105  --  107 107 104  CO2 21* 21*  --  23 25 25   GLUCOSE 102* 158*  --  149* 106* 99  BUN 14 16  --  21 19 18   CREATININE 0.98 0.89 0.95 0.98 0.95 0.86  CALCIUM 8.5* 8.5*  --  8.3* 8.2* 8.2*   Liver Function Tests: Recent Labs  Lab 04/13/21 0212  AST 33  ALT 27  ALKPHOS 47  BILITOT 0.4  PROT 5.0*  ALBUMIN 2.4*   No results for input(s): LIPASE, AMYLASE in the last 168 hours. No results for input(s): AMMONIA in the last 168 hours. CBC: Recent Labs  Lab 04/11/21 0733 04/12/21 0320 04/13/21 0212 04/14/21 0325 04/15/21 0234  WBC 10.7* 7.2 12.0* 6.5 6.5  HGB 13.8 12.9* 11.6* 11.1* 11.9*  HCT 42.2 38.8* 33.8* 33.2* 36.1*  MCV 90.0 89.4 88.9 90.5 90.9  PLT 217 207 207 186 190   Cardiac Enzymes: No  results for input(s): CKTOTAL, CKMB, CKMBINDEX, TROPONINI in the last 168 hours. BNP: Invalid input(s): POCBNP CBG: No results for input(s): GLUCAP in the last 168 hours. D-Dimer No results for input(s): DDIMER in the last 72 hours. Hgb A1c No results for input(s): HGBA1C in the last 72 hours. Lipid Profile No results for input(s): CHOL, HDL, LDLCALC, TRIG, CHOLHDL, LDLDIRECT in the last 72 hours. Thyroid function studies No results for input(s): TSH, T4TOTAL, T3FREE, THYROIDAB in the last 72 hours.  Invalid input(s): FREET3 Anemia work up No results for input(s): VITAMINB12, FOLATE, FERRITIN, TIBC, IRON, RETICCTPCT in the last 72 hours. Urinalysis No results found for: COLORURINE, APPEARANCEUR, [place], [place], GLUCOSEU, [place], [place], [place], PROTEINUR, UROBILINOGEN, NITRITE, LEUKOCYTESUR Sepsis Labs Invalid input(s): PROCALCITONIN,  WBC,  LACTICIDVEN Microbiology Recent Results (from the past 240 hour(s))  MRSA Next Gen by PCR, Nasal     Status: None   Collection Time: 04/10/21  1:00 PM   Specimen: Nasal Mucosa; Nasal Swab  Result Value Ref Range Status   MRSA by PCR Next Gen NOT DETECTED NOT DETECTED Final    Comment: (NOTE) The GeneXpert MRSA Assay (FDA approved for NASAL specimens only), is one component of a comprehensive MRSA colonization surveillance program. It is not intended to diagnose MRSA infection nor to guide or monitor treatment for MRSA infections. Test performance is not FDA approved in patients less than 54 years old. Performed at [name], [place] [address] [place], [place] [address]   SARS CORONAVIRUS 2 (TAT 6-24 HRS) Nasopharyngeal Nasopharyngeal Swab     Status: None   Collection Time: 04/15/21  2:15 PM   Specimen: Nasopharyngeal Swab  Result Value Ref Range Status   SARS Coronavirus 2 NEGATIVE NEGATIVE Final    Comment: (NOTE) SARS-CoV-2 target nucleic acids are NOT DETECTED.  The SARS-CoV-2 RNA is generally detectable in upper  and lower respiratory specimens during the acute phase of infection. Negative results do not preclude SARS-CoV-2 infection, do not rule out co-infections with other pathogens, and should not be used as the sole basis for treatment or other patient management decisions. Negative results must be combined with clinical observations, patient history, and epidemiological information. The expected result is Negative.  Fact Sheet for Patients: SugarRoll.be  Fact Sheet for Healthcare Providers: https://www.woods-mathews.com/  This test is not yet approved or cleared by the [place] FDA and  has been authorized for detection and/or diagnosis of SARS-CoV-2 by FDA under an Emergency Use Authorization (EUA). This EUA will remain  in effect (meaning this test can be used) for the duration of the COVID-19 declaration under Se ction 564(b)(1) of the Act, 21 U.S.C. section 360bbb-3(b)(1), unless the authorization is terminated or revoked sooner.  Performed at [facility], [place] [address] [place], [place] [address]      Time coordinating discharge: Over 30 minutes  SIGNED:   Darliss Cheney, MD  [facility] 04/16/2021, 8:36  AM  If 7PM-7AM, please contact night-coverage [email or web address]

## 2021-04-16 NOTE — TOC Transition Note (Signed)
Transition of Care [facility]) - CM/SW Discharge Note   Patient Details  Name: BIAGIO SNELSON MRN: [id number] Date of Birth: 11-19-1933  [facility] CM/SW Contact:  Patrice Paradise, LCSW Phone Number: [phone] 04/16/2021, 9:45 AM   Clinical Narrative:     Patient will DC to:?[facility] Anticipated DC date:?04/16/2021 Family notified:?[name] Transport by: Sharin Mons   Per MD patient ready for DC to [facility]. RN, patient, patient's family, and facility notified of DC. Discharge Summary sent to facility. RN given number for report [phone] ext. [phone]. DC packet on chart. Ambulance transport requested for patient.   CSW signing off.   Judd Lien, [place] [phone]   Final next level of care: Skilled Nursing Facility Barriers to Discharge: Barriers Resolved   Patient Goals and CMS Choice Patient states their goals for this hospitalization and ongoing recovery are:: Patient says "its too early in his hospital stay to have a goal". CMS Medicare.gov Compare Post Acute Care list provided to:: Patient Choice offered to / list presented to : Patient  Discharge Placement              Patient chooses bed at: [facility] Patient to be transferred to facility by: [facility] Name of family member notified: [name] Patient and family notified of of transfer: 04/16/21  Discharge Plan and Services In-house Referral: NA Discharge Planning Services: CM Consult Post Acute Care Choice: Home Health          DME Arranged:  (patietn wants to wait for DME arrangement)                    Social Determinants of Health (SDOH) Interventions     Readmission Risk Interventions No flowsheet data found.

## 2021-04-16 NOTE — Progress Notes (Signed)
Inpatient Rehab Admissions Coordinator:   Pt. Discharging to SNF, CIR to sign off.   Clemens Catholic, [place], [place] Admissions Coordinator  [phone] ([place]) [phone] (office)

## 2021-04-16 NOTE — Plan of Care (Signed)
°  Problem: Clinical Measurements: Goal: Postoperative complications will be avoided or minimized Outcome: Progressing Goal: Signs and symptoms of graft occlusion will improve Outcome: Progressing

## 2021-04-16 NOTE — Progress Notes (Signed)
Attempted to call report to [facility] [facility] [phone] ext [phone]. Did not get anyone to answer. Will attempt again later. Thomas Hoff, RN

## 2021-04-18 ENCOUNTER — Emergency Department (HOSPITAL_COMMUNITY)
Admission: EM | Admit: 2021-04-18 | Discharge: 2021-04-19 | Disposition: A | Discharge status: Skilled Nursing Facility | Payer: Medicare Other | Attending: Emergency Medicine | Admitting: Emergency Medicine

## 2021-04-18 ENCOUNTER — Ambulatory Visit: Payer: Medicare Other | Admitting: Podiatry

## 2021-04-18 ENCOUNTER — Emergency Department (HOSPITAL_COMMUNITY): Payer: Medicare Other

## 2021-04-18 ENCOUNTER — Other Ambulatory Visit: Payer: Self-pay

## 2021-04-18 ENCOUNTER — Encounter (HOSPITAL_COMMUNITY): Payer: Self-pay

## 2021-04-18 DIAGNOSIS — L03115 Cellulitis of right lower limb: Secondary | ICD-10-CM | POA: Diagnosis not present

## 2021-04-18 DIAGNOSIS — I2510 Atherosclerotic heart disease of native coronary artery without angina pectoris: Secondary | ICD-10-CM | POA: Diagnosis not present

## 2021-04-18 DIAGNOSIS — Z79899 Other long term (current) drug therapy: Secondary | ICD-10-CM | POA: Diagnosis not present

## 2021-04-18 DIAGNOSIS — S91101A Unspecified open wound of right great toe without damage to nail, initial encounter: Secondary | ICD-10-CM | POA: Insufficient documentation

## 2021-04-18 DIAGNOSIS — I119 Hypertensive heart disease without heart failure: Secondary | ICD-10-CM | POA: Insufficient documentation

## 2021-04-18 DIAGNOSIS — Z7982 Long term (current) use of aspirin: Secondary | ICD-10-CM | POA: Diagnosis not present

## 2021-04-18 DIAGNOSIS — X58XXXA Exposure to other specified factors, initial encounter: Secondary | ICD-10-CM | POA: Insufficient documentation

## 2021-04-18 DIAGNOSIS — T889XXA Complication of surgical and medical care, unspecified, initial encounter: Secondary | ICD-10-CM | POA: Insufficient documentation

## 2021-04-18 DIAGNOSIS — R21 Rash and other nonspecific skin eruption: Secondary | ICD-10-CM | POA: Insufficient documentation

## 2021-04-18 DIAGNOSIS — T8149XA Infection following a procedure, other surgical site, initial encounter: Secondary | ICD-10-CM

## 2021-04-18 DIAGNOSIS — Z951 Presence of aortocoronary bypass graft: Secondary | ICD-10-CM | POA: Insufficient documentation

## 2021-04-18 DIAGNOSIS — S80821A Blister (nonthermal), right lower leg, initial encounter: Secondary | ICD-10-CM | POA: Insufficient documentation

## 2021-04-18 DIAGNOSIS — Z950 Presence of cardiac pacemaker: Secondary | ICD-10-CM | POA: Insufficient documentation

## 2021-04-18 NOTE — ED Notes (Signed)
Patient transported to X-ray 

## 2021-04-18 NOTE — ED Triage Notes (Signed)
Pt BIB [facility] from [facility] after staff stating pt's R leg has increased swelling and redness at procedure site. Per EMS, pt d/c from [facility] 2 days ago after having R arterial graft to lower leg. Pt denying pain. Hx- Afib, HTN

## 2021-04-18 NOTE — ED Provider Notes (Signed)
[facility] EMERGENCY DEPARTMENT Provider Note   CSN: [id number] Arrival date & time: 04/18/21  2218     History  Chief Complaint  Patient presents with   Post-op Problem    [name] is a 88 y.o. male.  HPI     This is an 88 year old male with a history of hypertension, coronary artery disease, atrial flutter, recent femoral tibial bypass graft right lower extremity on 1/31 who presents with right leg swelling and redness.  Patient was discharged from the hospital 2 days ago.  He is currently at a nursing facility.  Noted increasing swelling of the right leg.  Denies any significant increased pain.  Has noted some blistering adjacent to the graft incision site.  No drainage from the site.  He has ongoing wounds of the right great toe.  He also has a wound on the dorsum of the foot.  He is not currently on any antibiotics.  Home Medications Prior to Admission medications   Medication Sig Start Date End Date Taking? Authorizing Provider  acetaminophen (TYLENOL) 325 MG tablet Take 975 mg by mouth every 8 (eight) hours.   Yes [provider]  aspirin EC 81 MG EC tablet Take 1 tablet (81 mg total) by mouth daily. Swallow whole. 04/16/21 05/16/21 Yes Pahwani, Daleen Bo, MD  carvedilol (COREG) 3.125 MG tablet Take 1 tablet (3.125 mg total) by mouth 2 (two) times daily with a meal. 04/16/21 05/16/21 Yes Pahwani, Daleen Bo, MD  hydroxypropyl methylcellulose / hypromellose (ISOPTO TEARS / GONIOVISC) 2.5 % ophthalmic solution Place 1 drop into both eyes daily as needed for dry eyes.   Yes [provider]  losartan (COZAAR) 25 MG tablet Take 1 tablet (25 mg total) by mouth daily. 04/16/21 05/16/21 Yes Pahwani, Daleen Bo, MD  melatonin 3 MG TABS tablet Take 6 mg by mouth at bedtime as needed (insomnia).   Yes [provider]  oxyCODONE (OXY IR/ROXICODONE) 5 MG immediate release tablet Take 5 mg by mouth every 6 (six) hours as needed for severe pain.   Yes [provider]  tamsulosin (FLOMAX) 0.4 MG CAPS capsule Take 1 capsule (0.4 mg total) by mouth daily after supper. Patient taking differently: Take 0.4 mg by mouth at bedtime. 04/16/21 05/16/21 Yes Pahwani, Daleen Bo, MD  oxyCODONE-acetaminophen (PERCOCET/ROXICET) 5-325 MG tablet Take 1-2 tablets by mouth every 8 (eight) hours as needed for moderate pain. Patient not taking: Reported on 04/18/2021 04/16/21   Hughie Closs, MD      Allergies    Amiodarone, Gabapentin, Metoprolol, Statins, Doxycycline, Neomycin, Niacin, Ramipril, Aleve [naproxen], and Rocephin [ceftriaxone]    Review of Systems   Review of Systems  Constitutional:  Negative for fever.  Skin:  Positive for color change and wound.  All other systems reviewed and are negative.  Physical Exam Updated Vital Signs BP 113/60 (BP Location: Right Arm)    Pulse 72    Temp 99.1 F (37.3 C) (Oral)    Resp 16    SpO2 98%  Physical Exam Vitals and nursing note reviewed.  Constitutional:      Appearance: He is well-developed.     Comments: Elderly, nontoxic-appearing  HENT:     Head: Normocephalic and atraumatic.     Nose: Nose normal.     Mouth/Throat:     Mouth: Mucous membranes are moist.  Eyes:     Pupils: Pupils are equal, round, and reactive to light.  Cardiovascular:     Rate and Rhythm: Normal rate and  regular rhythm.     Heart sounds: Normal heart sounds. No murmur heard. Pulmonary:     Effort: Pulmonary effort is normal. No respiratory distress.     Breath sounds: Normal breath sounds. No wheezing.  Abdominal:     Palpations: Abdomen is soft.     Tenderness: There is no abdominal tenderness. There is no rebound.  Musculoskeletal:     Cervical back: Neck supple.     Comments: Incision over the lateral aspect of the distal right lower extremity with staples in place, clean dry and intact, foot and right lower extremity to the mid calf are erythematous and warm to touch, there is blistering noted inferior and medial to the  incision site Unable to palpate pulse  Lymphadenopathy:     Cervical: No cervical adenopathy.  Skin:    General: Skin is warm and dry.     Comments: Wound noted to right great toe with eschar, no active drainage, blistering and slothing of skin over the dorsum of the right foot Petechial rash most notably over the left foot and left lower extremity  Neurological:     Mental Status: He is alert and oriented to person, place, and time.  Psychiatric:        Mood and Affect: Mood normal.       ED Results / Procedures / Treatments   Labs (all labs ordered are listed, but only abnormal results are displayed) Labs Reviewed  CBC WITH DIFFERENTIAL/PLATELET - Abnormal; Notable for the following components:      Result Value   Abs Immature Granulocytes 0.13 (*)    All other components within normal limits  BASIC METABOLIC PANEL - Abnormal; Notable for the following components:   Glucose, Bld 112 (*)    Calcium 8.2 (*)    All other components within normal limits  LACTIC ACID, PLASMA  LACTIC ACID, PLASMA    EKG None  Radiology DG Tibia/Fibula Right  Result Date: 04/18/2021 CLINICAL DATA:  Blister at surgical site EXAM: RIGHT TIBIA AND FIBULA - 2 VIEW COMPARISON:  None. FINDINGS: Frontal and lateral views of the right tibia and fibula are obtained. There are surgical skin staples in the right lower leg. 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 No evidence of subcutaneous gas. There is diffuse subcutaneous edema. No acute or destructive bony lesions. Moderate osteoarthritis of the right knee and ankle. IMPRESSION: 1. 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 2. Diffuse subcutaneous edema. 3. Osteoarthritis of the right knee and ankle. Electronically Signed   By: Sharlet Salina M.D.   On: 04/18/2021 23:50    Procedures Procedures    Medications Ordered in ED Medications - No data to display  ED Course/ Medical Decision Making/ A&P Clinical Course  as of 04/19/21 0239  Tue Apr 19, 2021  0018 Dopplerable dorsalis pedis pulses bilaterally [CH]  0238 Spoke with pharmacy.  Really only outpatient antibiotic options are Bactrim, clindamycin, or linezolid.  All of which have significant potential side effects.  Given that he is well-appearing without leukocytosis or signs of significant systemic illness, I feel the risk of outpatient antibiotic outweigh the benefit at this time.  Vascular surgery, Dr. Randie Heinz, was messaged via inbasket regarding this plan of care. [CH]    Clinical Course User Index [CH] Anetha Slagel, Mayer Masker, MD                           Medical Decision Making Amount  and/or Complexity of Data Reviewed Labs: ordered. Radiology: ordered.   This patient presents to the ED for concern of redness of the right lower extremity, this involves an extensive number of treatment options, and is a complaint that carries with it a high risk of complications and morbidity.  The differential diagnosis includes cellulitis, postoperative infection, vascular occlusion  MDM:    This is an 88 year old with recent complicated medical history including a vascular graft of the right lower extremity who presents with concern for worsening redness.  He presents from SNF.  Was discharged 2 days ago.  He cannot tell me how his leg looks compared to when he was discharged.  He denies any pain.  Denies any fevers.  On exam he does have some redness and warmth to the right lower extremity.  Unclear how much of this is new.  He also has 2 small blisters just adjacent to the incision site which itself looks very clean dry and intact.  No drainage.  X-rays do not show any subcutaneous air to suggest necrotizing fasciitis.  He has dopplerable dorsalis pedis pulses bilaterally.  Do not feel vascular occlusion or reocclusion is likely.  Labs obtained.  No leukocytosis.  Lactate normal.  No signs or symptoms of sepsis.  I considered antibiotic therapy; however, given his  recent prolonged course of antibiotics in the hospital in addition to multiple antibiotic allergies, do not feel that the options are risk-free.  Given that he is systemically well-appearing, will defer antibiotics at this time.  I suspect some of the redness may be chronic and not actually active cellulitis.  I have messaged his vascular surgeon to share with him this plan of care.  Do not feel vascular surgery needs to be consulted emergently as he appears to be well perfused.  We will discharge him back to his living facility with close monitoring and return precautions. (Labs, imaging)  Labs: I Ordered, and personally interpreted labs.  The pertinent results include: Normal CBC, normal lactate  Imaging Studies ordered: I ordered imaging studies including x-ray without subcutaneous air I independently visualized and interpreted imaging. I agree with the radiologist interpretation  Additional history obtained from chart.  External records from outside source obtained and reviewed including OR record  Critical Interventions: None  Consultations: I requested consultation with the none,  and discussed lab and imaging findings as well as pertinent plan - they recommend: None  Cardiac Monitoring: The patient was maintained on a cardiac monitor.  I personally viewed and interpreted the cardiac monitored which showed an underlying rhythm of: Normal sinus rhythm  Reevaluation: After the interventions noted above, I reevaluated the patient and found that they have :stayed the same   Considered admission for: Postoperative infection  Social Determinants of Health: SNF facility  Disposition: Discharge  Co morbidities that complicate the patient evaluation  Past Medical History:  Diagnosis Date   Aortic atherosclerosis (HCC) 02/16/2016   BMI 31.0-31.9,adult 05/05/2015   CAD in native artery 02/14/2015   Chronic rhinitis 05/05/2015   Elevated PSA 05/05/2015   [name]   Essential  hypertension 02/14/2015   GERD (gastroesophageal reflux disease) 05/05/2015   History of cardiac radiofrequency ablation 09/15/2016   Hx of CABG 09/15/2016   Hyperlipidemia 02/14/2015   Ischemic cardiomyopathy    Lumbar degenerative disc disease 05/05/2015   Partial small bowel obstruction (HCC) 03/05/2017   Prediabetes 05/05/2015   Presence of cardiac pacemaker    [place]   Primary osteoarthritis involving multiple joints 05/05/2015  Last Assessment & Plan:  Relevant Hx: Course: Daily Update: Today's Plan:he has known OA to his joints and he has seen chiropractor in the past for this and he states that he was told he had degenerative changes to his back several times. 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 fib for more than 1-1/2 years   Ventral hernia without obstruction or gangrene 05/05/2015     Medicines No orders of the defined types were placed in this encounter.   I have reviewed the patients home medicines and have made adjustments as needed  Problem List / ED Course: Problem List Items Addressed This Visit   None Visit Diagnoses     Cellulitis, wound, post-operative    -  Primary                   Final Clinical Impression(s) / ED Diagnoses Final diagnoses:  Cellulitis, wound, post-operative    Rx / DC Orders ED Discharge Orders     None         Shon BatonHorton, Rether Rison F, MD 04/19/21 [id number]

## 2021-04-19 LAB — CBC WITH DIFFERENTIAL/PLATELET
Abs Immature Granulocytes: 0.13 10*3/uL — ABNORMAL HIGH (ref 0.00–0.07)
Basophils Absolute: 0.1 10*3/uL (ref 0.0–0.1)
Basophils Relative: 1 %
Eosinophils Absolute: 0.3 10*3/uL (ref 0.0–0.5)
Eosinophils Relative: 4 %
HCT: 42 % (ref 39.0–52.0)
Hemoglobin: 13.8 g/dL (ref 13.0–17.0)
Immature Granulocytes: 2 %
Lymphocytes Relative: 13 %
Lymphs Abs: 1 10*3/uL (ref 0.7–4.0)
MCH: 30.3 pg (ref 26.0–34.0)
MCHC: 32.9 g/dL (ref 30.0–36.0)
MCV: 92.1 fL (ref 80.0–100.0)
Monocytes Absolute: 0.6 10*3/uL (ref 0.1–1.0)
Monocytes Relative: 8 %
Neutro Abs: 5.4 10*3/uL (ref 1.7–7.7)
Neutrophils Relative %: 72 %
Platelets: 211 10*3/uL (ref 150–400)
RBC: 4.56 MIL/uL (ref 4.22–5.81)
RDW: 12 % (ref 11.5–15.5)
WBC: 7.6 10*3/uL (ref 4.0–10.5)
nRBC: 0 % (ref 0.0–0.2)

## 2021-04-19 LAB — BASIC METABOLIC PANEL
Anion gap: 7 (ref 5–15)
BUN: 18 mg/dL (ref 8–23)
CO2: 24 mmol/L (ref 22–32)
Calcium: 8.2 mg/dL — ABNORMAL LOW (ref 8.9–10.3)
Chloride: 107 mmol/L (ref 98–111)
Creatinine, Ser: 0.88 mg/dL (ref 0.61–1.24)
GFR, Estimated: >60 mL/min (ref >60)
Glucose, Bld: 112 mg/dL — ABNORMAL HIGH (ref 70–99)
Potassium: 4 mmol/L (ref 3.5–5.1)
Sodium: 138 mmol/L (ref 135–145)

## 2021-04-19 LAB — LACTIC ACID, PLASMA: Lactic Acid, Venous: 1.2 mmol/L (ref 0.5–1.9)

## 2021-04-19 NOTE — ED Notes (Signed)
Pedal pulses heard with doppler, pulses marked.

## 2021-04-19 NOTE — Discharge Instructions (Signed)
You were evaluated today for concern for worsening redness at your prior surgical site.  Your labs and work-up are largely reassuring.  Given that you are afebrile and had a full course of antibiotics while in the hospital in combination with your multiple antibiotic allergies, I do not feel antibiotics at this time are warranted.  Close monitoring for worsening redness, crepitus, drainage from the surgical site.

## 2021-04-19 NOTE — ED Notes (Signed)
Patient verbalizes understanding of d/c instructions. Opportunities for questions and answers were provided. Pt d/c from ED and taken by [facility] back to [facility].

## 2021-05-07 ENCOUNTER — Other Ambulatory Visit: Payer: Self-pay

## 2021-05-07 DIAGNOSIS — I739 Peripheral vascular disease, unspecified: Secondary | ICD-10-CM

## 2021-05-11 ENCOUNTER — Encounter: Payer: Self-pay | Admitting: Physician Assistant

## 2021-05-11 ENCOUNTER — Ambulatory Visit (HOSPITAL_COMMUNITY)
Admission: RE | Admit: 2021-05-11 | Discharge: 2021-05-11 | Disposition: A | Discharge status: Home / Self Care | Payer: Medicare Other | Source: Ambulatory Visit | Attending: Vascular Surgery | Admitting: Vascular Surgery

## 2021-05-11 ENCOUNTER — Ambulatory Visit (INDEPENDENT_AMBULATORY_CARE_PROVIDER_SITE_OTHER): Payer: Medicare Other | Admitting: Physician Assistant

## 2021-05-11 ENCOUNTER — Other Ambulatory Visit: Payer: Self-pay

## 2021-05-11 VITALS — BP 133/71 | HR 68 | Temp 98.2°F | Resp 20 | Ht 70.0 in | Wt 200.0 lb

## 2021-05-11 DIAGNOSIS — I739 Peripheral vascular disease, unspecified: Secondary | ICD-10-CM

## 2021-05-11 NOTE — Progress Notes (Signed)
?POST OPERATIVE OFFICE NOTE ? ? ? ?CC:  F/u for surgery ? ?HPI:  This is a 88 y.o. male who is s/p right common femoral to ATA bypass with distal vein patch angioplasty with 61mm ringed PTFE and harvest of right GSV on 04/12/2021 by Dr. Randie Heinz for critical limb ischemia with wound right great toe.   ? ?He has past medical history including CAD status post CABG, sick sinus syndrome with pacemaker, A. fib, not on anticoagulation, right foot wound. ? ?Pt returns today for follow up accompanied by his daughter.  Pt is currently residing at [facility] in [place].   Pt states he feels the wound on his right great toe is getting better. He states that the right foot is feeling better since the bypass.  He does have some swelling in both legs with the right more than left.  He states he sits in the wheelchair during the day.  He hasn't really noticed if it gets better after he has been in bed.  He states he has gained some weight bc the food at [facility] is good.  He does have some bleeding from the shin on the left leg as he hit the car door today.  ? ?According to the records from [facility], they are cleansing the foot and applying calcium alginate followed by dry dressing every day shift.   ? ?He and his daughter have some concerns about him returning home from [facility] as he has macular degeneration and is not able to see to do dressing changes.  He states he does not think he would be able to bend over to do the dressing changes himself.  They also feel he needs to build up some more strength before safely returning to his home.   ? ?Pt not able to tolerate statins due to severe myalgias.  ? ?Allergies  ?Allergen Reactions  ? Amiodarone Nausea And Vomiting  ?  vomiting ?  ? Gabapentin   ?  dizziness ? ?  ? Metoprolol Other (See Comments)  ?  Drop in heart rate, dizziness.  ?  ? Statins   ?  myalgias  ? Doxycycline   ?  Unknown to patient ?  ? Neomycin   ?  Unknown to patient ? ?  ? Niacin   ?  Unknown to patient ?  ? Ramipril   ?   Unknown to patient ? ?  ? Aleve [Naproxen] Other (See Comments)  ?  Makes him feel drunk. He does not want it.   ? Rocephin [Ceftriaxone] Rash  ?  Whole body rash  ? ? ?Current Outpatient Medications  ?Medication Sig Dispense Refill  ? acetaminophen (TYLENOL) 325 MG tablet Take 975 mg by mouth every 8 (eight) hours.    ? aspirin EC 81 MG EC tablet Take 1 tablet (81 mg total) by mouth daily. Swallow whole. 30 tablet 0  ? carvedilol (COREG) 3.125 MG tablet Take 1 tablet (3.125 mg total) by mouth 2 (two) times daily with a meal. 60 tablet 0  ? hydroxypropyl methylcellulose / hypromellose (ISOPTO TEARS / GONIOVISC) 2.5 % ophthalmic solution Place 1 drop into both eyes daily as needed for dry eyes.    ? losartan (COZAAR) 25 MG tablet Take 1 tablet (25 mg total) by mouth daily. 30 tablet 0  ? melatonin 3 MG TABS tablet Take 6 mg by mouth at bedtime as needed (insomnia).    ? oxyCODONE (OXY IR/ROXICODONE) 5 MG immediate release tablet Take 5 mg by mouth  every 6 (six) hours as needed for severe pain.    ? oxyCODONE-acetaminophen (PERCOCET/ROXICET) 5-325 MG tablet Take 1-2 tablets by mouth every 8 (eight) hours as needed for moderate pain. (Patient not taking: Reported on 04/18/2021) 10 tablet 0  ? tamsulosin (FLOMAX) 0.4 MG CAPS capsule Take 1 capsule (0.4 mg total) by mouth daily after supper. (Patient taking differently: Take 0.4 mg by mouth at bedtime.) 30 capsule 0  ? ?No current facility-administered medications for this visit.  ? ? ? ROS:  See HPI ? ?Physical Exam: ? ?Today's Vitals  ? 05/11/21 1243  ?BP: 133/71  ?Pulse: 68  ?Resp: 20  ?Temp: 98.2 ?F (36.8 ?C)  ?TempSrc: Temporal  ?SpO2: 96%  ?Weight: 200 lb (90.7 kg)  ?Height: 5\' 10"  (1.778 m)  ? ?Body mass index is 28.7 kg/m?. ? ? ?Incision:  right groin incision has healed nicely;  right below knee incision looks good with staples in tact. ?Extremities:  brisk doppler signals DP/PT/Peroneal with right > left ? ? ? ? ? ?ABI  05/11/2021: ?+-------+-----------+-----------+------------+------------+  ?ABI/TBIToday's ABIToday's TBIPrevious ABIPrevious TBI  ?+-------+-----------+-----------+------------+------------+  ?Right  1.01       Bandage    0.29        0             ?+-------+-----------+-----------+------------+------------+  ?Left   0.99       0.62       0.98        0.22          ?+-------+-----------+-----------+------------+------------+  ? ? ?Assessment/Plan:  This is a 88 y.o. male who is s/p: ?ight common femoral to ATA bypass with distal vein patch angioplasty with 4mm ringed PTFE and harvest of right GSV on 04/12/2021 by Dr. 04/14/2021 for critical limb ischemia with wound right great toe.   ? ?-ABI significantly improved from pre-op.  He has brisk doppler signals PT/DP/peroneal with right > left.  His right great toe wound is clean with good granulation tissue and appears to be healing.  Continue current dressing changes.  ?-the incision on the lower leg has healed nicely and the staples were removed today. A wet to dry dressing was placed on the right great toe wound.  A bandage was placed over the LLE superficial wound where his leg hit the car door.  ?-he does have some swelling in BLE with right > left and this is not unexpected with revascularization.  He does sit in the dependent position. I discussed with he and his daughter that when he is not up and about, he should elevate his legs when possible with knees slightly bent with the ankles above the knees and that puts feet above the heart, which would help with the swelling.   ?-continue asa.  He is not on statin due to myalgias. ?-will have him return in 4 weeks to check the wound on his right foot.  He will call sooner if there are any issues before then.  Otherwise, he will need to be scheduled for RLE arterial duplex and ABI in 6 months at his next visit.  ? ? ?Randie Heinz, PAC ?[facility] ?[phone] ? ? ?Clinic MD:  [phone] ? ?

## 2021-05-29 NOTE — Progress Notes (Signed)
?Cardiology Office Note:   ? ?Date:  05/30/2021  ? ?ID:  [name], DOB December 11, 1933, MRN [id number] ? ?PCP:  Gordan Payment., MD  ?Cardiologist:  Norman Herrlich, MD   ? ?Referring MD: Gordan Payment., MD  ? ? ?ASSESSMENT:   ? ?1. Coronary artery disease involving native coronary artery of native heart with angina pectoris (HCC)   ?2. Presence of cardiac pacemaker   ?3. Essential hypertension   ?4. Mixed hyperlipidemia   ?5. Ischemic cardiomyopathy   ?6. PAD (peripheral artery disease) (HCC)   ? ?PLAN:   ? ?In order of problems listed above: ? ?Stable CAD having no anginal discomfort its not unusual to find saphenous vein graft occlusion approaching 20 years post bypass she had patients continue to do well with patent left thoracic artery having no angina New York Heart Association class I and I agree that he would definitely benefit from further cardiac interventions electively and continue medical therapy including his aspirin his beta-blocker is just and currently is not on lipid-lowering therapy statin intolerant ?Stable device function followed in our clinic ?Continue guideline directed therapy including his low-dose ARB blocker to transition to [facility] with borderline low blood pressure ?Poorly controlled hyperlipidemia statin intolerant next visit we will discuss the potential nonstatin therapy. ?Improved after surgical revascularization ? ? ?Next appointment: 6 months ? ? ?Medication Adjustments/Labs and Tests Ordered: ?Current medicines are reviewed at length with the patient today.  Concerns regarding medicines are outlined above.  ?No orders of the defined types were placed in this encounter. ? ?No orders of the defined types were placed in this encounter. ? ? ?Chief Complaint  ?Patient presents with  ? Follow-up  ? Coronary Artery Disease  ? ? ?History of Present Illness:   ? ?[name] is a 88 y.o. male with a hx of CAD EF 45 %, CABG in 2006 with LTA  SVG to D1  and second SVG sequential to RCA and  Marginal, atrial flutter with EP ablation in 2014, RBBB, VT and a pacemaker  last seen by me 07/08/2019.  He was seen by my partner Dr. Denzil Magnuson at [facility] 04/15/2021. ? ?Compliance with diet, lifestyle and medications: Yes ? ?His daughter is present participates in evaluation decision making ?He is slowly and steadily improving and anticipates going home soon ?He is not having edema shortness of breath chest pain palpitation or syncope ?I reviewed the recent testing with him with mildly reduced ejection fraction and saphenous vein bypass graft occlusion both best felt to be treated medically. ?Past Medical History:  ?Diagnosis Date  ? Abnormal nuclear cardiac imaging test   ? Abnormal nuclear stress test 09/15/2016  ? Anticoagulant long-term use 05/05/2015  ? Aortic atherosclerosis (HCC) 02/16/2016  ? Benign paroxysmal vertigo of both ears 07/28/2019  ? BMI 31.0-31.9,adult 05/05/2015  ? CAD (coronary artery disease) with history of CABG 05/05/2015  ? Cellulitis and abscess of foot 03/31/2021  ? Cellulitis of left upper arm 01/18/2021  ? Chronic combined systolic and diastolic heart failure (HCC) 03/31/2021  ? Chronic rhinitis 05/05/2015  ? Critical limb ischemia of both lower extremities (HCC)   ? Dizziness 10/05/2016  ? Elevated PSA 05/05/2015  ? [name]  ? GERD (gastroesophageal reflux disease) 05/05/2015  ? High risk medication use 05/05/2015  ? History of cardiac radiofrequency ablation 09/15/2016  ? History of small bowel obstruction 03/05/2017  ? Hx of CABG 09/15/2016  ? Hyperlipidemia 02/14/2015  ? Impacted cerumen, bilateral 04/24/2018  ?  Ischemic cardiomyopathy   ? Lumbar degenerative disc disease 05/05/2015  ? Macular degeneration 03/31/2021  ? Malaise and fatigue 05/05/2015  ? Last Assessment & Plan:  Relevant Hx: Course: Daily Update: Today's Plan:he admits that this can be prominent for him and he had UA checked to ensure not having infection which he was concerned about and reassured him of  this, encouraged him to remain active and his meds contribute to this as well which he is aware of   Electronically signed by: Krystal Clark, NP 08/12/15 1303  Last A  ? Old MI (myocardial infarction) 09/15/2016  ? PAD (peripheral artery disease) (HCC) 03/31/2021  ? PAF (paroxysmal atrial fibrillation) (HCC) 12/31/2017  ? Telemetry detected  ? Paroxysmal atrial flutter (HCC) 04/01/2021  ? Partial small bowel obstruction (HCC) 03/05/2017  ? Prediabetes 05/05/2015  ? Preoperative cardiovascular examination 02/14/2015  ? Presence of cardiac pacemaker   ? Biotronik  ? Primary osteoarthritis involving multiple joints 05/05/2015  ? Last Assessment & Plan:  Relevant Hx: Course: Daily Update: Today's Plan:he has known OA to his joints and he has seen chiropractor in the past for this and he states that he was told he had degenerative changes to his back several times. The knee needs to be xrayed to the right with the swelling , he is taking the oxycodone only as needed but it works well he does not feel dizzy whereas the aleve  ? Rash 04/09/2021  ? RBBB with left anterior fascicular block 02/14/2015  ? Recurrent incisional hernia 03/05/2017  ? Sick sinus syndrome (HCC) status post pacemaker 02/17/2015  ? Thrombocytopenia due to drugs 07/16/2017  ? Typical atrial flutter (HCC) 05/05/2015  ? Last Assessment & Plan:  Warfarin was stopped September 07, 2016 by physicians assistant or [place] cardiology after consistent monitoring showed no recurrence of A. fib for more than 1-1/2 years  ? Ulcer of right foot, limited to breakdown of skin (HCC) 05/20/2020  ? Urinary hesitancy 04/01/2021  ? Ventral hernia without obstruction or gangrene 05/05/2015  ? ? ?Past Surgical History:  ?Procedure Laterality Date  ? ABDOMINAL AORTOGRAM W/LOWER EXTREMITY Bilateral 04/04/2021  ? Procedure: ABDOMINAL AORTOGRAM W/LOWER EXTREMITY;  Surgeon: Maeola Harman, MD;  [facility] INVASIVE CV LAB;  Service: Cardiovascular;  Laterality:  Bilateral;  ? ATRIAL FLUTTER ABLATION    ? COLON SURGERY    ? CORONARY ARTERY BYPASS GRAFT    ? FEMORAL-TIBIAL BYPASS GRAFT Right 04/12/2021  ? Procedure: RIGHT FEMORAL- ANTERIOR TIBIAL ARTERY BYPASS;  Surgeon: Maeola Harman, MD;  Location[facility] OR;  Service: Vascular;  Laterality: Right;  ? INSERT / REPLACE / REMOVE PACEMAKER    ? LEFT HEART CATH AND CORS/GRAFTS ANGIOGRAPHY N/A 04/11/2021  ? Procedure: LEFT HEART CATH AND CORS/GRAFTS ANGIOGRAPHY;  Surgeon: Yvonne Kendall, MD;  Location: [facility] INVASIVE CV LAB;  Service: Cardiovascular;  Laterality: N/A;  ? ? ?Current Medications: ?Current Meds  ?Medication Sig  ? alum & mag hydroxide-simeth (GERI-LANTA) 200-200-20 MG/5ML suspension Take 30 mLs by mouth every 4 (four) hours as needed for indigestion.  ? ammonium lactate (AMLACTIN) 12 % cream Apply 1 application. topically daily.  ? aspirin EC 81 MG tablet Take 81 mg by mouth daily.  ? carvedilol (COREG) 3.125 MG tablet Take 3.125 mg by mouth 2 (two) times daily with a meal.  ? collagenase (SANTYL) 250 UNIT/GM ointment Apply 1 application. topically daily.  ? furosemide (LASIX) 40 MG tablet Take 40 mg by mouth.  ? hydroxypropyl methylcellulose / hypromellose (ISOPTO  TEARS / GONIOVISC) 2.5 % ophthalmic solution Place 1 drop into both eyes daily as needed for dry eyes.  ? losartan (COZAAR) 25 MG tablet Take 25 mg by mouth daily.  ? Magnesium Hydroxide (MILK OF MAGNESIA PO) Take 30 mLs by mouth as needed (constipation).  ? melatonin 3 MG TABS tablet Take 6 mg by mouth at bedtime as needed (insomnia).  ? oxyCODONE (OXY IR/ROXICODONE) 5 MG immediate release tablet Take 5 mg by mouth every 6 (six) hours as needed for severe pain.  ? Polyethylene Glycol 1450 POWD Take 17 g by mouth 2 (two) times daily.  ? potassium chloride (KLOR-CON) 20 MEQ packet Take 20 mEq by mouth daily.  ? senna (SENOKOT) 8.6 MG tablet Take 2 tablets by mouth daily.  ? tamsulosin (FLOMAX) 0.4 MG CAPS capsule Take 0.4 mg by mouth daily.  ?   ? ?Allergies:   Amiodarone, Gabapentin, Metoprolol, Statins, Doxycycline, Neomycin, Niacin, Ramipril, Aleve [naproxen], and Rocephin [ceftriaxone]  ? ?Social History  ? ?Socioeconomic History  ? Marital status: Divor

## 2021-05-30 ENCOUNTER — Other Ambulatory Visit: Payer: Self-pay

## 2021-05-30 ENCOUNTER — Ambulatory Visit: Payer: Medicare Other | Admitting: Cardiology

## 2021-05-30 ENCOUNTER — Encounter: Payer: Self-pay | Admitting: Cardiology

## 2021-05-30 VITALS — BP 101/42 | HR 64 | Ht 70.0 in | Wt 191.0 lb

## 2021-05-30 DIAGNOSIS — E782 Mixed hyperlipidemia: Secondary | ICD-10-CM | POA: Diagnosis not present

## 2021-05-30 DIAGNOSIS — I25119 Atherosclerotic heart disease of native coronary artery with unspecified angina pectoris: Secondary | ICD-10-CM | POA: Diagnosis not present

## 2021-05-30 DIAGNOSIS — I10 Essential (primary) hypertension: Secondary | ICD-10-CM

## 2021-05-30 DIAGNOSIS — I739 Peripheral vascular disease, unspecified: Secondary | ICD-10-CM

## 2021-05-30 DIAGNOSIS — I255 Ischemic cardiomyopathy: Secondary | ICD-10-CM

## 2021-05-30 DIAGNOSIS — Z950 Presence of cardiac pacemaker: Secondary | ICD-10-CM | POA: Diagnosis not present

## 2021-05-30 NOTE — Patient Instructions (Signed)
Medication Instructions:  Your physician recommends that you continue on your current medications as directed. Please refer to the Current Medication list given to you today.  *If you need a refill on your cardiac medications before your next appointment, please call your pharmacy*   Lab Work: None ordered   If you have labs (blood work) drawn today and your tests are completely normal, you will receive your results only by: . MyChart Message (if you have MyChart) OR . A paper copy in the mail If you have any lab test that is abnormal or we need to change your treatment, we will call you to review the results.   Testing/Procedures: None ordered    Follow-Up: At [facility], you and your health needs are our priority.  As part of our continuing mission to provide you with exceptional heart care, we have created designated Provider Care Teams.  These Care Teams include your primary Cardiologist (physician) and Advanced Practice Providers (APPs -  Physician Assistants and Nurse Practitioners) who all work together to provide you with the care you need, when you need it.  We recommend signing up for the patient portal called "MyChart".  Sign up information is provided on this After Visit Summary.  MyChart is used to connect with patients for Virtual Visits (Telemedicine).  Patients are able to view lab/test results, encounter notes, upcoming appointments, etc.  Non-urgent messages can be sent to your provider as well.   To learn more about what you can do with MyChart, go to [email or web address].    Your next appointment:   6 month(s)  The format for your next appointment:   In Person  Provider:   Brian Munley, MD   Other Instructions None   

## 2021-06-13 NOTE — Progress Notes (Signed)
?Office Note  ? ? ? ?CC:  follow up ?Requesting Provider:  Raina Mina., MD ? ?HPI: [name] is a 88 y.o. (04-11-1933) male who presents for right lower extremity wound check. He is s/p right common femoral to ATA bypass with distal vein patch angioplasty with 28mm ringed PTFE and harvest of right GSV on 04/12/2021 by Dr. Donzetta Matters for critical limb ischemia with wound right great toe.   ? ?He was just seen post operatively on 05/11/21. He was doing well at that time with some improvement of Right great toe wound. He was residing at [facility] in [place]. There was some concern about wound care if he was discharged as he lives alone. His non invasive studies showed improvement significantly compared to pre operative results. Good granulation tissue in wound bed. Staples were removed from his right leg incision.  ? ?He is here for wound check today. He presents with his daughter. Reports he remains at [facility]. He did go home for 4 days but apparently slipped and fell so he returned back to [facility]. He feels overall that his leg is doing better. He says is not having any pain and feels wound is improving. His daughter is concerned about the swelling in his leg and foot. He says he is unable to elevate because he sits in chair all day and night. Unable to sleep in bed because his roommate is too loud. He also explains that he completed all of his therapy so he currently is not doing anything. Walks very minimally.  ? ?The pt is not on a statin for cholesterol management due to intolerance ?The pt is on a daily aspirin.   Other AC:  none ?The pt is on BB, ARB for hypertension.   ?The pt is not diabetic.   ?Tobacco hx:  never ? ?Past Medical History:  ?Diagnosis Date  ? Abnormal nuclear cardiac imaging test   ? Abnormal nuclear stress test 09/15/2016  ? Anticoagulant long-term use 05/05/2015  ? Aortic atherosclerosis ([place]) 02/16/2016  ? Benign paroxysmal vertigo of both ears 07/28/2019  ? BMI 31.0-31.9,adult 05/05/2015  ? CAD  (coronary artery disease) with history of CABG 05/05/2015  ? Cellulitis and abscess of foot 03/31/2021  ? Cellulitis of left upper arm 01/18/2021  ? Chronic combined systolic and diastolic heart failure ([place]) 03/31/2021  ? Chronic rhinitis 05/05/2015  ? Critical limb ischemia of both lower extremities ([place])   ? Dizziness 10/05/2016  ? Elevated PSA 05/05/2015  ? [name]  ? GERD (gastroesophageal reflux disease) 05/05/2015  ? High risk medication use 05/05/2015  ? History of cardiac radiofrequency ablation 09/15/2016  ? History of small bowel obstruction 03/05/2017  ? Hx of CABG 09/15/2016  ? Hyperlipidemia 02/14/2015  ? Impacted cerumen, bilateral 04/24/2018  ? Ischemic cardiomyopathy   ? Lumbar degenerative disc disease 05/05/2015  ? Macular degeneration 03/31/2021  ? Malaise and fatigue 05/05/2015  ? Last Assessment & Plan:  Relevant Hx: Course: Daily Update: Today's Plan:he admits that this can be prominent for him and he had UA checked to ensure not having infection which he was concerned about and reassured him of this, encouraged him to remain active and his meds contribute to this as well which he is aware of   Electronically signed by: Mayer Camel, NP 08/12/15 1303  Last A  ? Old MI (myocardial infarction) 09/15/2016  ? PAD (peripheral artery disease) ([place]) 03/31/2021  ? PAF (paroxysmal atrial fibrillation) ([place]) 12/31/2017  ? Telemetry detected  ?  Paroxysmal atrial flutter ([place]) 04/01/2021  ? Partial small bowel obstruction ([place]) 03/05/2017  ? Prediabetes 05/05/2015  ? Preoperative cardiovascular examination 02/14/2015  ? Presence of cardiac pacemaker   ? Biotronik  ? Primary osteoarthritis involving multiple joints 05/05/2015  ? Last Assessment & Plan:  Relevant Hx: Course: Daily Update: Today's Plan:he has known OA to his joints and he has seen chiropractor in the past for this and he states that he was told he had degenerative changes to his back several times. The knee needs to be xrayed to the right with  the swelling , he is taking the oxycodone only as needed but it works well he does not feel dizzy whereas the aleve  ? Rash 04/09/2021  ? RBBB with left anterior fascicular block 02/14/2015  ? Recurrent incisional hernia 03/05/2017  ? Sick sinus syndrome ([place]) status post pacemaker 02/17/2015  ? Thrombocytopenia due to drugs 07/16/2017  ? Typical atrial flutter ([place]) 05/05/2015  ? Last Assessment & Plan:  Warfarin was stopped September 07, 2016 by physicians assistant or [place] cardiology after consistent monitoring showed no recurrence of A. fib for more than 1-1/2 years  ? Ulcer of right foot, limited to breakdown of skin ([place]) 05/20/2020  ? Urinary hesitancy 04/01/2021  ? Ventral hernia without obstruction or gangrene 05/05/2015  ? ? ?Past Surgical History:  ?Procedure Laterality Date  ? ABDOMINAL AORTOGRAM W/LOWER EXTREMITY Bilateral 04/04/2021  ? Procedure: ABDOMINAL AORTOGRAM W/LOWER EXTREMITY;  Surgeon: Waynetta Sandy, MD;  Location: [place] CV LAB;  Service: Cardiovascular;  Laterality: Bilateral;  ? ATRIAL FLUTTER ABLATION    ? COLON SURGERY    ? CORONARY ARTERY BYPASS GRAFT    ? FEMORAL-TIBIAL BYPASS GRAFT Right 04/12/2021  ? Procedure: RIGHT FEMORAL- ANTERIOR TIBIAL ARTERY BYPASS;  Surgeon: Waynetta Sandy, MD;  Location: [place];  Service: Vascular;  Laterality: Right;  ? INSERT / REPLACE / REMOVE PACEMAKER    ? LEFT HEART CATH AND CORS/GRAFTS ANGIOGRAPHY N/A 04/11/2021  ? Procedure: LEFT HEART CATH AND CORS/GRAFTS ANGIOGRAPHY;  Surgeon: Nelva Bush, MD;  Location: [place] CV LAB;  Service: Cardiovascular;  Laterality: N/A;  ? ? ?Social History  ? ?Socioeconomic History  ? Marital status: Divorced  ?  Spouse name: Not on file  ? Number of children: Not on file  ? Years of education: Not on file  ? Highest education level: Not on file  ?Occupational History  ? Not on file  ?Tobacco Use  ? Smoking status: Never  ?  Passive exposure: Never  ? Smokeless tobacco: Never  ?Vaping Use  ?  Vaping Use: Never used  ?Substance and Sexual Activity  ? Alcohol use: Never  ? Drug use: Never  ? Sexual activity: Not on file  ?Other Topics Concern  ? Not on file  ?Social History Narrative  ? Not on file  ? ?Social Determinants of Health  ? ?Financial Resource Strain: Not on file  ?Food Insecurity: Not on file  ?Transportation Needs: Not on file  ?Physical Activity: Not on file  ?Stress: Not on file  ?Social Connections: Not on file  ?Intimate Partner Violence: Not on file  ? ? ?Family History  ?Problem Relation Age of Onset  ? Diabetes Sister   ? Hyperlipidemia Daughter   ? Hypertension Daughter   ? Diabetes Daughter   ? Rheum arthritis Daughter   ? Hypertension Daughter   ? AAA (abdominal aortic aneurysm) Daughter   ? Migraines Daughter   ? Heart attack Neg Hx   ?  Heart disease Neg Hx   ? Heart failure Neg Hx   ? ? ?Current Outpatient Medications  ?Medication Sig Dispense Refill  ? aspirin EC 81 MG tablet Take 81 mg by mouth daily.    ? carvedilol (COREG) 3.125 MG tablet Take 3.125 mg by mouth 2 (two) times daily with a meal.    ? collagenase (SANTYL) 250 UNIT/GM ointment Apply 1 application. topically daily.    ? furosemide (LASIX) 40 MG tablet Take 40 mg by mouth.    ? hydroxypropyl methylcellulose / hypromellose (ISOPTO TEARS / GONIOVISC) 2.5 % ophthalmic solution Place 1 drop into both eyes daily as needed for dry eyes.    ? losartan (COZAAR) 25 MG tablet Take 25 mg by mouth daily.    ? Magnesium Hydroxide (MILK OF MAGNESIA PO) Take 30 mLs by mouth as needed (constipation).    ? melatonin 3 MG TABS tablet Take 6 mg by mouth at bedtime as needed (insomnia).    ? oxyCODONE (OXY IR/ROXICODONE) 5 MG immediate release tablet Take 5 mg by mouth every 6 (six) hours as needed for severe pain.    ? Polyethylene Glycol 1450 POWD Take 17 g by mouth 2 (two) times daily.    ? potassium chloride (KLOR-CON) 20 MEQ packet Take 20 mEq by mouth daily.    ? senna (SENOKOT) 8.6 MG tablet Take 2 tablets by mouth daily.    ?  tamsulosin (FLOMAX) 0.4 MG CAPS capsule Take 0.4 mg by mouth daily.    ? alum & mag hydroxide-simeth (MAALOX/MYLANTA) 200-200-20 MG/5ML suspension Take 30 mLs by mouth every 4 (four) hours as needed for indige

## 2021-06-15 ENCOUNTER — Encounter: Payer: Self-pay | Admitting: Physician Assistant

## 2021-06-15 ENCOUNTER — Ambulatory Visit (INDEPENDENT_AMBULATORY_CARE_PROVIDER_SITE_OTHER): Payer: Medicare Other | Admitting: Physician Assistant

## 2021-06-15 VITALS — BP 121/66 | HR 63 | Temp 97.6°F | Ht 70.0 in

## 2021-06-15 DIAGNOSIS — I7025 Atherosclerosis of native arteries of other extremities with ulceration: Secondary | ICD-10-CM

## 2021-06-15 DIAGNOSIS — I739 Peripheral vascular disease, unspecified: Secondary | ICD-10-CM

## 2021-07-19 NOTE — Progress Notes (Signed)
[facility]  ?HISTORY AND PHYSICAL  ? ?History of Present Illness:  Patient is a 88 y.o. year old male who presents for evaluation of right GT wound.  He is s/p right common femoral to ATA bypass with distal vein patch angioplasty with 61mm ringed PTFE and harvest of right GSV on 04/12/2021 by Dr. Randie Heinz for critical limb ischemia with wound right great toe.   ? He is slowly increasing his activty using a stationary bike and walking with a walker daily.  He does note swelling in the right LE.  He sleeps sitting up and does not lay down all day or night.  He denies rest pain and states the wound is healing.   ? ?The pt is not on a statin for cholesterol management due to intolerance ?The pt is on a daily aspirin.   Other AC:  none ?The pt is on BB, ARB for hypertension.   ?The pt is not diabetic.   ?Tobacco hx:  never ? ?Past Medical History:  ?Diagnosis Date  ? Abnormal nuclear cardiac imaging test   ? Abnormal nuclear stress test 09/15/2016  ? Anticoagulant long-term use 05/05/2015  ? Aortic atherosclerosis (HCC) 02/16/2016  ? Benign paroxysmal vertigo of both ears 07/28/2019  ? BMI 31.0-31.9,adult 05/05/2015  ? CAD (coronary artery disease) with history of CABG 05/05/2015  ? Cellulitis and abscess of foot 03/31/2021  ? Cellulitis of left upper arm 01/18/2021  ? Chronic combined systolic and diastolic heart failure (HCC) 03/31/2021  ? Chronic rhinitis 05/05/2015  ? Critical limb ischemia of both lower extremities (HCC)   ? Dizziness 10/05/2016  ? Elevated PSA 05/05/2015  ? [name]  ? GERD (gastroesophageal reflux disease) 05/05/2015  ? High risk medication use 05/05/2015  ? History of cardiac radiofrequency ablation 09/15/2016  ? History of small bowel obstruction 03/05/2017  ? Hx of CABG 09/15/2016  ? Hyperlipidemia 02/14/2015  ? Impacted cerumen, bilateral 04/24/2018  ? Ischemic cardiomyopathy   ? Lumbar degenerative disc disease 05/05/2015  ? Macular degeneration 03/31/2021  ? Malaise and fatigue  05/05/2015  ? Last Assessment & Plan:  Relevant Hx: Course: Daily Update: Today's Plan:he admits that this can be prominent for him and he had UA checked to ensure not having infection which he was concerned about and reassured him of this, encouraged him to remain active and his meds contribute to this as well which he is aware of   Electronically signed by: Krystal Clark, NP 08/12/15 1303  Last A  ? Old MI (myocardial infarction) 09/15/2016  ? PAD (peripheral artery disease) (HCC) 03/31/2021  ? PAF (paroxysmal atrial fibrillation) (HCC) 12/31/2017  ? Telemetry detected  ? Paroxysmal atrial flutter (HCC) 04/01/2021  ? Partial small bowel obstruction (HCC) 03/05/2017  ? Prediabetes 05/05/2015  ? Preoperative cardiovascular examination 02/14/2015  ? Presence of cardiac pacemaker   ? Biotronik  ? Primary osteoarthritis involving multiple joints 05/05/2015  ? Last Assessment & Plan:  Relevant Hx: Course: Daily Update: Today's Plan:he has known OA to his joints and he has seen chiropractor in the past for this and he states that he was told he had degenerative changes to his back several times. The knee needs to be xrayed to the right with the swelling , he is taking the oxycodone only as needed but it works well he does not feel dizzy whereas the aleve  ? Rash 04/09/2021  ? RBBB with left anterior fascicular block 02/14/2015  ? Recurrent incisional hernia 03/05/2017  ?  Sick sinus syndrome (HCC) status post pacemaker 02/17/2015  ? Thrombocytopenia due to drugs 07/16/2017  ? Typical atrial flutter (HCC) 05/05/2015  ? Last Assessment & Plan:  Warfarin was stopped September 07, 2016 by physicians assistant or [place] cardiology after consistent monitoring showed no recurrence of A. fib for more than 1-1/2 years  ? Ulcer of right foot, limited to breakdown of skin (HCC) 05/20/2020  ? Urinary hesitancy 04/01/2021  ? Ventral hernia without obstruction or gangrene 05/05/2015  ? ? ?Past Surgical History:  ?Procedure Laterality  Date  ? ABDOMINAL AORTOGRAM W/LOWER EXTREMITY Bilateral 04/04/2021  ? Procedure: ABDOMINAL AORTOGRAM W/LOWER EXTREMITY;  Surgeon: Maeola Harmanain, Brandon Christopher, MD;  [facility];  Service: Cardiovascular;  Laterality: Bilateral;  ? ATRIAL FLUTTER ABLATION    ? COLON SURGERY    ? CORONARY ARTERY BYPASS GRAFT    ? FEMORAL-TIBIAL BYPASS GRAFT Right 04/12/2021  ? Procedure: RIGHT FEMORAL- ANTERIOR TIBIAL ARTERY BYPASS;  Surgeon: Maeola Harmanain, Brandon Christopher, MD;  [facility];  Service: Vascular;  Laterality: Right;  ? INSERT / REPLACE / REMOVE PACEMAKER    ? LEFT HEART CATH AND CORS/GRAFTS ANGIOGRAPHY N/A 04/11/2021  ? Procedure: LEFT HEART CATH AND CORS/GRAFTS ANGIOGRAPHY;  Surgeon: Yvonne KendallEnd, Christopher, MD;  [facility];  Service: Cardiovascular;  Laterality: N/A;  ? ? ?ROS:  ? ?General:  No weight loss, Fever, chills ? ?HEENT: No recent headaches, no nasal bleeding, no visual changes, no sore throat ? ?Neurologic: No dizziness, blackouts, seizures. No recent symptoms of stroke or mini- stroke. No recent episodes of slurred speech, or temporary blindness. ? ?Cardiac: No recent episodes of chest pain/pressure, no shortness of breath at rest.  No shortness of breath with exertion.  Denies history of atrial fibrillation or irregular heartbeat ? ?Vascular: No history of rest pain in feet.  No history of claudication.  No history of non-healing ulcer, No history of DVT  ? ?Pulmonary: No home oxygen, no productive cough, no hemoptysis,  No asthma or wheezing ? ?Musculoskeletal:  [ ]  Arthritis, [ ]  Low back pain,  [ ]  Joint pain ? ?Hematologic:No history of hypercoagulable state.  No history of easy bleeding.  No history of anemia ? ?Gastrointestinal: No hematochezia or melena,  No gastroesophageal reflux, no trouble swallowing ? ?Urinary: [ ]  chronic Kidney disease, [ ]  on HD - [ ]  MWF or [ ]  TTHS, [ ]  Burning with urination, [ ]  Frequent urination, [ ]  Difficulty urinating;  ? ?Skin: No  rashes ? ?Psychological: No history of anxiety,  No history of depression ? ?Social History ?Social History  ? ?Tobacco Use  ? Smoking status: Never  ?  Passive exposure: Never  ? Smokeless tobacco: Never  ?Vaping Use  ? Vaping Use: Never used  ?Substance Use Topics  ? Alcohol use: Never  ? Drug use: Never  ? ? ?Family History ?Family History  ?Problem Relation Age of Onset  ? Diabetes Sister   ? Hyperlipidemia Daughter   ? Hypertension Daughter   ? Diabetes Daughter   ? Rheum arthritis Daughter   ? Hypertension Daughter   ? AAA (abdominal aortic aneurysm) Daughter   ? Migraines Daughter   ? Heart attack Neg Hx   ? Heart disease Neg Hx   ? Heart failure Neg Hx   ? ? ?Allergies ? ?Allergies  ?Allergen Reactions  ? Amiodarone Nausea And Vomiting  ?  vomiting ?  ? Gabapentin   ?  dizziness ? ?  ? Metoprolol Other (  See Comments)  ?  Drop in heart rate, dizziness.  ?  ? Statins   ?  myalgias  ? Doxycycline   ?  Unknown to patient ?  ? Neomycin   ?  Unknown to patient ? ?  ? Niacin   ?  Unknown to patient ?  ? Ramipril   ?  Unknown to patient ? ?  ? Aleve [Naproxen] Other (See Comments)  ?  Makes him feel drunk. He does not want it.   ? Rocephin [Ceftriaxone] Rash  ?  Whole body rash  ? ? ? ?Current Outpatient Medications  ?Medication Sig Dispense Refill  ? alum & mag hydroxide-simeth (MAALOX/MYLANTA) 200-200-20 MG/5ML suspension Take 30 mLs by mouth every 4 (four) hours as needed for indigestion. (Patient not taking: Reported on 06/15/2021)    ? ammonium lactate (AMLACTIN) 12 % cream Apply 1 application. topically daily.    ? aspirin EC 81 MG tablet Take 81 mg by mouth daily.    ? carvedilol (COREG) 3.125 MG tablet Take 3.125 mg by mouth 2 (two) times daily with a meal.    ? collagenase (SANTYL) 250 UNIT/GM ointment Apply 1 application. topically daily.    ? furosemide (LASIX) 40 MG tablet Take 40 mg by mouth.    ? hydroxypropyl methylcellulose / hypromellose (ISOPTO TEARS / GONIOVISC) 2.5 % ophthalmic solution Place 1  drop into both eyes daily as needed for dry eyes.    ? losartan (COZAAR) 25 MG tablet Take 25 mg by mouth daily.    ? Magnesium Hydroxide (MILK OF MAGNESIA PO) Take 30 mLs by mouth as needed (constipation).    ? melatonin

## 2021-07-20 ENCOUNTER — Ambulatory Visit (INDEPENDENT_AMBULATORY_CARE_PROVIDER_SITE_OTHER): Payer: Medicare Other | Admitting: Physician Assistant

## 2021-07-20 ENCOUNTER — Encounter: Payer: Self-pay | Admitting: Physician Assistant

## 2021-07-20 VITALS — BP 113/60 | HR 56 | Temp 97.9°F | Resp 20 | Ht 70.0 in

## 2021-07-20 DIAGNOSIS — I739 Peripheral vascular disease, unspecified: Secondary | ICD-10-CM | POA: Diagnosis not present

## 2021-07-22 ENCOUNTER — Other Ambulatory Visit: Payer: Self-pay | Admitting: *Deleted

## 2021-07-22 DIAGNOSIS — I739 Peripheral vascular disease, unspecified: Secondary | ICD-10-CM

## 2021-07-22 DIAGNOSIS — I7025 Atherosclerosis of native arteries of other extremities with ulceration: Secondary | ICD-10-CM

## 2021-12-05 ENCOUNTER — Ambulatory Visit: Payer: Medicare Other | Admitting: Cardiology

## 2022-04-17 ENCOUNTER — Telehealth: Payer: Self-pay

## 2022-04-17 ENCOUNTER — Encounter: Payer: Self-pay | Admitting: Cardiology

## 2022-04-17 ENCOUNTER — Ambulatory Visit: Payer: Medicare Other | Attending: Cardiology | Admitting: Cardiology

## 2022-04-17 VITALS — BP 130/64 | HR 89 | Ht 70.0 in

## 2022-04-17 DIAGNOSIS — R0602 Shortness of breath: Secondary | ICD-10-CM

## 2022-04-17 DIAGNOSIS — I495 Sick sinus syndrome: Secondary | ICD-10-CM

## 2022-04-17 DIAGNOSIS — R600 Localized edema: Secondary | ICD-10-CM | POA: Diagnosis not present

## 2022-04-17 DIAGNOSIS — I5022 Chronic systolic (congestive) heart failure: Secondary | ICD-10-CM

## 2022-04-17 LAB — CUP PACEART INCLINIC DEVICE CHECK
Date Time Interrogation Session: 20240205165505
Implantable Lead Connection Status: 753985
Implantable Lead Connection Status: 753985
Implantable Lead Implant Date: 20140813
Implantable Lead Implant Date: 20140813
Implantable Lead Location: 753859
Implantable Lead Location: 753860
Implantable Lead Model: 350
Implantable Lead Serial Number: 1111
Implantable Lead Serial Number: 2222
Implantable Pulse Generator Implant Date: 20140813
Pulse Gen Serial Number: 68050901

## 2022-04-17 NOTE — Patient Instructions (Addendum)
Medication Instructions:  Your physician recommends that you continue on your current medications as directed. Please refer to the Current Medication list given to you today.  *If you need a refill on your cardiac medications before your next appointment, please call your pharmacy*   Lab Work: Today: BMET  & BNP If you have labs (blood work) drawn today and your tests are completely normal, you will receive your results only by: [place] (if you have MyChart) OR A paper copy in the mail If you have any lab test that is abnormal or we need to change your treatment, we will call you to review the results.   Testing/Procedures: None ordered   Follow-Up: At [facility], you and your health needs are our priority.  As part of our continuing mission to provide you with exceptional heart care, we have created designated Provider Care Teams.  These Care Teams include your primary Cardiologist (physician) and Advanced Practice Providers (APPs -  Physician Assistants and Nurse Practitioners) who all work together to provide you with the care you need, when you need it.  We recommend signing up for the patient portal called "MyChart".  Sign up information is provided on this After Visit Summary.  MyChart is used to connect with patients for Virtual Visits (Telemedicine).  Patients are able to view lab/test results, encounter notes, upcoming appointments, etc.  Non-urgent messages can be sent to your provider as well.   To learn more about what you can do with MyChart, go to [email or web address].    Your next appointment:   1 month(s)  The format for your next appointment:   In Person  Provider:   Shirlee More, MD{   Your physician recommends that you schedule a follow-up appointment in: 6 months with Dr. Curt Bears.    Thank you for choosing [facility]!!   Trinidad Curet, RN [phone]

## 2022-04-17 NOTE — Telephone Encounter (Signed)
I spoke with the patient daughter and she states the monitor is still at the patient house. He has been at [facility] for a year. She is going to make sure the monitor gets to the facility.

## 2022-04-17 NOTE — Telephone Encounter (Signed)
No remote checks received since 2022.  Biotronik PPM.

## 2022-04-17 NOTE — Progress Notes (Addendum)
Electrophysiology Office Note   Date:  04/17/2022   ID:  [name], DOB [birth year], MRN [id number]  PCP:  Raina Mina., MD  Cardiologist:  Bettina Gavia Primary Electrophysiologist:  Zaia Carre Meredith Leeds, MD    No chief complaint on file.     History of Present Illness: [name] is a 89 y.o. male who is being seen today for the evaluation of VT at the request of Shirlee More. Presenting today for electrophysiology evaluation.    He has a history significant for coronary artery disease, chronic systolic heart failure due to ischemic cardiomyopathy, CABG in 2006, atrial flutter post ablation in 2014, ventricular tachycardia, Biotronik pacemaker implanted for sick sinus syndrome.  Today, denies symptoms of palpitations, chest pain, claudication, dizziness, presyncope, syncope, bleeding, or neurologic sequela. The patient is tolerating medications without difficulties.  Over the last 2 weeks, he has been having more wheezing.  He is also been short of breath with lower extremity edema.  He has been having weeping in his legs.  His legs have been getting wrapped, but he continues to have a significant amount of edema.    Past Medical History:  Diagnosis Date   Abnormal nuclear cardiac imaging test    Abnormal nuclear stress test 09/15/2016   Anticoagulant long-term use 05/05/2015   Aortic atherosclerosis ([place]) 02/16/2016   Benign paroxysmal vertigo of both ears 07/28/2019   BMI 31.0-31.9,adult 05/05/2015   CAD (coronary artery disease) with history of CABG 05/05/2015   Cellulitis and abscess of foot 03/31/2021   Cellulitis of left upper arm 01/18/2021   Chronic combined systolic and diastolic heart failure ([place]) 03/31/2021   Chronic rhinitis 05/05/2015   Critical limb ischemia of both lower extremities (HCC)    Dizziness 10/05/2016   Elevated PSA 05/05/2015   [name]   GERD (gastroesophageal reflux disease) 05/05/2015   High risk medication use 05/05/2015   History of cardiac  radiofrequency ablation 09/15/2016   History of small bowel obstruction 03/05/2017   Hx of CABG 09/15/2016   Hyperlipidemia 02/14/2015   Impacted cerumen, bilateral 04/24/2018   Ischemic cardiomyopathy    Lumbar degenerative disc disease 05/05/2015   Macular degeneration 03/31/2021   Malaise and fatigue 05/05/2015   Last Assessment & Plan:  Relevant Hx: Course: Daily Update: Today's Plan:he admits that this can be prominent for him and he had UA checked to ensure not having infection which he was concerned about and reassured him of this, encouraged him to remain active and his meds contribute to this as well which he is aware of   Electronically signed by: Mayer Camel, NP 08/12/15 1303  Last A   Old MI (myocardial infarction) 09/15/2016   PAD (peripheral artery disease) ([place]) 03/31/2021   PAF (paroxysmal atrial fibrillation) ([place]) 12/31/2017   Telemetry detected   Paroxysmal atrial flutter ([place]) 04/01/2021   Partial small bowel obstruction ([place]) 03/05/2017   Prediabetes 05/05/2015   Preoperative cardiovascular examination 02/14/2015   Presence of cardiac pacemaker    Biotronik   Primary osteoarthritis involving multiple joints 05/05/2015   Last Assessment & Plan:  Relevant Hx: Course: Daily Update: Today's Plan:he has known OA to his joints and he has seen chiropractor in the past for this and he states that he was told he had degenerative changes to his back several times. The knee needs to be xrayed to the right with the swelling , he is taking the oxycodone only as needed but it works well he does not feel dizzy  whereas the aleve   Rash 04/09/2021   RBBB with left anterior fascicular block 02/14/2015   Recurrent incisional hernia 03/05/2017   Sick sinus syndrome ([place]) status post pacemaker 02/17/2015   Thrombocytopenia due to drugs 07/16/2017   Typical atrial flutter ([place]) 05/05/2015   Last Assessment & Plan:  Warfarin was stopped September 07, 2016 by physicians assistant or [place]  cardiology after consistent monitoring showed no recurrence of A. fib for more than 1-1/2 years   Ulcer of right foot, limited to breakdown of skin ([place]) 05/20/2020   Urinary hesitancy 04/01/2021   Ventral hernia without obstruction or gangrene 05/05/2015   Past Surgical History:  Procedure Laterality Date   ABDOMINAL AORTOGRAM W/LOWER EXTREMITY Bilateral 04/04/2021   Procedure: ABDOMINAL AORTOGRAM W/LOWER EXTREMITY;  Surgeon: Waynetta Sandy, MD;  Location: [place] CV LAB;  Service: Cardiovascular;  Laterality: Bilateral;   ATRIAL FLUTTER ABLATION     COLON SURGERY     CORONARY ARTERY BYPASS GRAFT     FEMORAL-TIBIAL BYPASS GRAFT Right 04/12/2021   Procedure: RIGHT FEMORAL- ANTERIOR TIBIAL ARTERY BYPASS;  Surgeon: Waynetta Sandy, MD;  Location: [place];  Service: Vascular;  Laterality: Right;   INSERT / REPLACE / [place] CATH AND CORS/GRAFTS ANGIOGRAPHY N/A 04/11/2021   Procedure: LEFT HEART CATH AND CORS/GRAFTS ANGIOGRAPHY;  Surgeon: Nelva Bush, MD;  Location: [place] CV LAB;  Service: Cardiovascular;  Laterality: N/A;     Current Outpatient Medications  Medication Sig Dispense Refill   alum & mag hydroxide-simeth (MAALOX/MYLANTA) 161-096-04 MG/5ML suspension Take 30 mLs by mouth every 4 (four) hours as needed for indigestion.     ammonium lactate (AMLACTIN) 12 % cream Apply 1 application. topically daily.     aspirin EC 81 MG tablet Take 81 mg by mouth daily.     carvedilol (COREG) 3.125 MG tablet Take 3.125 mg by mouth 2 (two) times daily with a meal.     citalopram (CELEXA) 20 MG tablet Take 20 mg by mouth at bedtime.     collagenase (SANTYL) 250 UNIT/GM ointment Apply 1 application. topically daily.     furosemide (LASIX) 40 MG tablet Take 40 mg by mouth 2 (two) times daily.     hydroxypropyl methylcellulose / hypromellose (ISOPTO TEARS / GONIOVISC) 2.5 % ophthalmic solution Place 1 drop into both eyes daily as needed for dry eyes.      ipratropium-albuterol (DUONEB) 0.5-2.5 (3) MG/3ML SOLN Take 3 mLs by nebulization every 4 (four) hours as needed (wheezing).     losartan (COZAAR) 25 MG tablet Take 25 mg by mouth daily.     Magnesium Hydroxide (MILK OF MAGNESIA PO) Take 30 mLs by mouth as needed (constipation).     melatonin 3 MG TABS tablet Take 3 mg by mouth at bedtime as needed (insomnia).     omeprazole (PRILOSEC) 20 MG capsule Take 20 mg by mouth daily.     ondansetron (ZOFRAN) 4 MG tablet Take 4 mg by mouth every 4 (four) hours as needed for nausea or vomiting.     oxyCODONE (OXY IR/ROXICODONE) 5 MG immediate release tablet Take 5 mg by mouth every 6 (six) hours as needed for severe pain.     Polyethylene Glycol 1450 POWD Take 17 g by mouth 2 (two) times daily.     potassium chloride (KLOR-CON) 20 MEQ packet Take 20 mEq by mouth daily.     senna (SENOKOT) 8.6 MG tablet Take 2 tablets by mouth daily.  tamsulosin (FLOMAX) 0.4 MG CAPS capsule Take 0.4 mg by mouth daily.     No current facility-administered medications for this visit.    Allergies:   Amiodarone, Gabapentin, Metoprolol, Statins, Doxycycline, Neomycin, Niacin, Ramipril, Aleve [naproxen], and Rocephin [ceftriaxone]   Social History:  The patient  reports that he has never smoked. He has never been exposed to tobacco smoke. He has never used smokeless tobacco. He reports that he does not drink alcohol and does not use drugs.   Family History:  The patient's family history includes AAA (abdominal aortic aneurysm) in his daughter; Diabetes in his daughter and sister; Hyperlipidemia in his daughter; Hypertension in his daughter and daughter; Migraines in his daughter; Rheum arthritis in his daughter.   ROS:  Please see the history of present illness.   Otherwise, review of systems is positive for none.   All other systems are reviewed and negative.   PHYSICAL EXAM: VS:  BP 130/64   Pulse 89   Ht 5\' 10"  (1.778 m)   SpO2 97%   BMI 27.41 kg/m  , BMI  Body mass index is 27.41 kg/m. GEN: Well nourished, well developed, in no acute distress  HEENT: normal  Neck: + JVD, carotid bruits, or masses Cardiac: RRR; no murmurs, rubs, or gallops, 2+ edema to the upper thigh Respiratory:  clear to auscultation bilaterally, normal work of breathing GI: soft, nontender, nondistended, + BS MS: no deformity or atrophy  Skin: warm and dry, device site well healed Neuro:  Strength and sensation are intact Psych: euthymic mood, full affect  EKG:  EKG is ordered today. Personal review of the ekg ordered shows AV paced  Personal review of the device interrogation today. Results in [facility]   Recent Labs: 04/18/2021: BUN 18; Creatinine, Ser 0.88; Hemoglobin 13.8; Platelets 211; Potassium 4.0; Sodium 138    Lipid Panel     Component Value Date/Time   CHOL 83 04/13/2021 0212   CHOL 151 07/08/2019 0932   TRIG 39 04/13/2021 0212   HDL 27 (L) 04/13/2021 0212   HDL 43 07/08/2019 0932   CHOLHDL 3.1 04/13/2021 0212   VLDL 8 04/13/2021 0212   LDLCALC 48 04/13/2021 0212   LDLCALC 88 07/08/2019 0932     Wt Readings from Last 3 Encounters:  05/30/21 191 lb (86.6 kg)  05/11/21 200 lb (90.7 kg)  04/14/21 198 lb (89.8 kg)      Other studies Reviewed: Additional studies/ records that were reviewed today include: epic TTE 04/01/21  1. Global mild to moderate hypokinesis with akinesis of the anterior,  anteroseptal wall and apex c/w LAD disease. Left ventricular ejection  fraction, by estimation, is 40 to 45%. The left ventricle has mildly  decreased function. The left ventricle  demonstrates regional wall motion abnormalities (see scoring  diagram/findings for description). There is mild concentric left  ventricular hypertrophy. Left ventricular diastolic parameters are  indeterminate.   2. Right ventricular systolic function was not well visualized. The right  ventricular size is not well visualized.   3. Left atrial size was moderately dilated.    4. Right atrium is dilated.   5. The mitral valve is grossly normal. No evidence of mitral valve  regurgitation.   6. The aortic valve is grossly normal. Aortic valve regurgitation is  mild. Aortic valve sclerosis/calcification is present, without any  evidence of aortic stenosis.   7. The inferior vena cava not well visualized.    LHC 04/11/21 Severe native coronary artery disease, including chronic total occlusions  of mid/distal LMCA, mid LAD (site of prior PCI), and ostial rPDA.  There is also irregular mid LAD disease beyond the occluded stent of up to 50%, sequential 30-70% proximal through distal LCx lesions, and sequential 80% proximal and 95% distal RCA stenoses. Widely patent LIMA-LAD. Patent SVG-ramus intermedius with occlusion of sequential portion of the graft to OM3.  SVG-ramus backfills the LCx and proximal LAD. Chronically occluded SVG-D1. Mildly elevated left ventricular filling pressure (LVEDP 18 mmHg).   ASSESSMENT AND PLAN:  1.  Coronary artery disease with chronic stable angina: No current chest pain.  Post three-vessel CABG.  Continue plan per primary cardiology.  2.  Hypertension: Currently well-controlled  3.  Sick sinus syndrome: Status post Biotronik dual-chamber pacemaker.  Device functioning appropriately.  No changes at this time.  4.  Typical atrial flutter: Currently not anticoagulated.  Status post ablation without recurrence.  5.  Chronic systolic heart failure: Currently on optimal medical therapy.  He is not completely optimized.  For now, we Khrystyne Arpin hold off on further optimization.  He has significant lower extremity edema, JVD.  He would benefit from higher dose of Lasix.  He has been on 40 mg twice daily.  Sarin [place]un[place] check a BMP, BNP to further determine whether or not he can tolerate a higher dose of Lasix.  I am concerned that he may require hospitalization.  Once his volume status has improved, he would likely benefit from transitioning to [facility] and  potentially Jardiance/Farxiga.  Adryanna Friedt allow these changes to be made by his primary cardiologist.  I have alerted his primary cardiologist of this issue.  I was chaperoned during this clinic visit by Trinidad Curet, Myrtie Hawk, Geoffry Paradise.  Current medicines are reviewed at length with the patient today.   The patient does not have concerns regarding his medicines.  The following changes were made today: none  Labs/ tests ordered today include:  Orders Placed This Encounter  Procedures   Basic metabolic panel   Pro b natriuretic peptide (BNP)   EKG 12-Lead     Disposition:   FU 6 months  Signed, Sanad Fearnow Meredith Leeds, MD  04/17/2022 3:10 PM     [facility] [address] [place] [address] [phone] (office) [phone] (fax)

## 2022-04-18 LAB — BASIC METABOLIC PANEL
BUN/Creatinine Ratio: 21 (ref 10–24)
BUN: 27 mg/dL (ref 8–27)
CO2: 23 mmol/L (ref 20–29)
Calcium: 9.1 mg/dL (ref 8.6–10.2)
Chloride: 102 mmol/L (ref 96–106)
Creatinine, Ser: 1.3 mg/dL — ABNORMAL HIGH (ref 0.76–1.27)
Glucose: 124 mg/dL — ABNORMAL HIGH (ref 70–99)
Potassium: 4.7 mmol/L (ref 3.5–5.2)
Sodium: 139 mmol/L (ref 134–144)
eGFR: 54 mL/min/{1.73_m2} — ABNORMAL LOW (ref >59)

## 2022-04-18 LAB — PRO B NATRIURETIC PEPTIDE: NT-Pro BNP: 3519 pg/mL — ABNORMAL HIGH (ref 0–486)

## 2022-04-23 NOTE — Progress Notes (Unsigned)
Cardiology Office Note:    Date:  04/24/2022   ID:  [name], DOB 11-21-1933, MRN [id number]  PCP:  Raina Mina., MD  Cardiologist:  Shirlee More, MD    Referring MD: Raina Mina., MD    ASSESSMENT:    1. Chronic combined systolic and diastolic congestive heart failure ([place])   2. Ischemic cardiomyopathy   3. Coronary artery disease involving native coronary artery of native heart with angina pectoris ([place])   4. Presence of cardiac pacemaker   5. Mixed hyperlipidemia    PLAN:    In order of problems listed above:  He is markedly decompensated short of breath at rest with anasarca there is no opportunity for oral diuretics to be effective.  He requires hospitalization and likely a long stay with diuresis.  Ideally we should get him over to guideline directed treatment SGLT2 inhibitor for seizure continue carvedilol transition his ARB to [facility] 24-26 twice daily and depending on renal function and blood pressure spironolactone.  Also repeat echocardiogram. Stable CAD Stable device therapy Statin intolerant and is not on lipid-lowering treatment Discussed with Dr. Nyoka Cowden admitting hospitalist advises referral to the ED as there are no beds for direct admission  Next appointment: To be determined after hospital discharge  Medication Adjustments/Labs and Tests Ordered: Current medicines are reviewed at length with the patient today.  Concerns regarding medicines are outlined above.  No orders of the defined types were placed in this encounter.  No orders of the defined types were placed in this encounter.  FU for heart failure  History of Present Illness:    [name] is a 89 y.o. male with a hx of CAD with CABG 2006 LV dysfunction previous EF 45% atrial arrhythmia with atrial flutter and EP ablation 2014 peripheral artery disease with femorofemoral bypass graft January 2023 right bundle branch block ventricular tachycardia and permanent pacemaker followed in our  device clinic.  He was last seen 05/30/2021 continued on beta-blocker and loop diuretic and transition to [facility] which was never initiated.[name]  He is seen today after visit with the EP when he was found to be in decompensated heart failure and his diuretic was increased.  Recent labs show mild renal insufficiency creatinine 1.3 GFR 54 cc potassium 4.7 and his N-terminal proBNP was severely elevated at 3519.  He had a gated pool study with an ejection fraction of 34% January 2023.  An echo cardiogram at the same time showing global hypokinesia as well as areas of segmental akinesia with an EF estimated at 40 to 45% and mild aortic regurgitation.  Compliance with diet, lifestyle and medications: Yes  His daughter is present he is on improved he has anasarca edema above his umbilicus he is short of breath at rest and wheezing and has trouble he cannot sleep at night because he is so short of breath in the bed he also has oozing from the lower extremities from his intense  tight edema  There is no opportunity for oral diuretics to work I think he is going to be admitted to the hospital sooner and later either electively or as an emergency and I advised him to be admitted to the hospital or contact the hospitalist group and see if I can arrange that. Require intravenous diuretic And repeat echocardiogram Past Medical History:  Diagnosis Date   Abnormal nuclear cardiac imaging test    Abnormal nuclear stress test 09/15/2016   Anticoagulant long-term use 05/05/2015   Aortic atherosclerosis (HCC)  02/16/2016   Benign paroxysmal vertigo of both ears 07/28/2019   BMI 31.0-31.9,adult 05/05/2015   CAD (coronary artery disease) with history of CABG 05/05/2015   Cellulitis and abscess of foot 03/31/2021   Cellulitis of left upper arm 01/18/2021   Chronic combined systolic and diastolic heart failure ([place]) 03/31/2021   Chronic rhinitis 05/05/2015   Critical limb ischemia of both lower extremities (HCC)    Dizziness  10/05/2016   Elevated PSA 05/05/2015   [name]   GERD (gastroesophageal reflux disease) 05/05/2015   High risk medication use 05/05/2015   History of cardiac radiofrequency ablation 09/15/2016   History of small bowel obstruction 03/05/2017   Hx of CABG 09/15/2016   Hyperlipidemia 02/14/2015   Impacted cerumen, bilateral 04/24/2018   Ischemic cardiomyopathy    Lumbar degenerative disc disease 05/05/2015   Macular degeneration 03/31/2021   Malaise and fatigue 05/05/2015   Last Assessment & Plan:  Relevant Hx: Course: Daily Update: Today's Plan:he admits that this can be prominent for him and he had UA checked to ensure not having infection which he was concerned about and reassured him of this, encouraged him to remain active and his meds contribute to this as well which he is aware of   Electronically signed by: Mayer Camel, NP 08/12/15 1303  Last A   Old MI (myocardial infarction) 09/15/2016   PAD (peripheral artery disease) ([place]) 03/31/2021   PAF (paroxysmal atrial fibrillation) ([place]) 12/31/2017   Telemetry detected   Paroxysmal atrial flutter ([place]) 04/01/2021   Partial small bowel obstruction ([place]) 03/05/2017   Prediabetes 05/05/2015   Preoperative cardiovascular examination 02/14/2015   Presence of cardiac pacemaker    Biotronik   Primary osteoarthritis involving multiple joints 05/05/2015   Last Assessment & Plan:  Relevant Hx: Course: Daily Update: Today's Plan:he has known OA to his joints and he has seen chiropractor in the past for this and he states that he was told he had degenerative changes to his back several times. The knee needs to be xrayed to the right with the swelling , he is taking the oxycodone only as needed but it works well he does not feel dizzy whereas the aleve   Rash 04/09/2021   RBBB with left anterior fascicular block 02/14/2015   Recurrent incisional hernia 03/05/2017   Sick sinus syndrome ([place]) status post pacemaker 02/17/2015   Thrombocytopenia due to  drugs 07/16/2017   Typical atrial flutter ([place]) 05/05/2015   Last Assessment & Plan:  Warfarin was stopped September 07, 2016 by physicians assistant or [place] cardiology after consistent monitoring showed no recurrence of A. fib for more than 1-1/2 years   Ulcer of right foot, limited to breakdown of skin ([place]) 05/20/2020   Urinary hesitancy 04/01/2021   Ventral hernia without obstruction or gangrene 05/05/2015    Past Surgical History:  Procedure Laterality Date   ABDOMINAL AORTOGRAM W/LOWER EXTREMITY Bilateral 04/04/2021   Procedure: ABDOMINAL AORTOGRAM W/LOWER EXTREMITY;  Surgeon: Waynetta Sandy, MD;  Location: [place] [facility];  Service: Cardiovascular;  Laterality: Bilateral;   ATRIAL FLUTTER ABLATION     COLON SURGERY     CORONARY ARTERY BYPASS GRAFT     FEMORAL-TIBIAL BYPASS GRAFT Right 04/12/2021   Procedure: RIGHT FEMORAL- ANTERIOR TIBIAL ARTERY BYPASS;  Surgeon: Waynetta Sandy, MD;  Location: [place];  Service: Vascular;  Laterality: Right;   INSERT / REPLACE / [place] CATH AND CORS/GRAFTS ANGIOGRAPHY N/A 04/11/2021   Procedure: LEFT HEART CATH  AND CORS/GRAFTS ANGIOGRAPHY;  Surgeon: Nelva Bush, MD;  Location: [place] [facility];  Service: Cardiovascular;  Laterality: N/A;    Current Medications: Current Meds  Medication Sig   alum & mag hydroxide-simeth (MAALOX/MYLANTA) 200-200-20 MG/5ML suspension Take 30 mLs by mouth every 4 (four) hours as needed for indigestion.   ammonium lactate (AMLACTIN) 12 % cream Apply 1 application. topically daily.   aspirin EC 81 MG tablet Take 81 mg by mouth daily.   carvedilol (COREG) 3.125 MG tablet Take 3.125 mg by mouth 2 (two) times daily with a meal.   citalopram (CELEXA) 20 MG tablet Take 20 mg by mouth at bedtime.   collagenase (SANTYL) 250 UNIT/GM ointment Apply 1 application. topically daily.   furosemide (LASIX) 40 MG tablet Take 40 mg by mouth 2 (two) times daily.   hydroxypropyl  methylcellulose / hypromellose (ISOPTO TEARS / GONIOVISC) 2.5 % ophthalmic solution Place 1 drop into both eyes daily as needed for dry eyes.   ipratropium-albuterol (DUONEB) 0.5-2.5 (3) MG/3ML SOLN Take 3 mLs by nebulization every 4 (four) hours as needed (wheezing).   losartan (COZAAR) 25 MG tablet Take 25 mg by mouth daily.   Magnesium Hydroxide (MILK OF MAGNESIA PO) Take 30 mLs by mouth as needed (constipation).   melatonin 3 MG TABS tablet Take 3 mg by mouth at bedtime as needed (insomnia).   omeprazole (PRILOSEC) 20 MG capsule Take 20 mg by mouth daily.   ondansetron (ZOFRAN) 4 MG tablet Take 4 mg by mouth every 4 (four) hours as needed for nausea or vomiting.   oxyCODONE (OXY IR/ROXICODONE) 5 MG immediate release tablet Take 5 mg by mouth every 6 (six) hours as needed for severe pain.   Polyethylene Glycol 1450 POWD Take 17 g by mouth 2 (two) times daily.   potassium chloride (KLOR-CON) 20 MEQ packet Take 20 mEq by mouth daily.   senna (SENOKOT) 8.6 MG tablet Take 2 tablets by mouth daily.   tamsulosin (FLOMAX) 0.4 MG CAPS capsule Take 0.4 mg by mouth daily.     Allergies:   Amiodarone, Gabapentin, Metoprolol, Statins, Doxycycline, Neomycin, Niacin, Ramipril, Aleve [naproxen], and Rocephin [ceftriaxone]   Social History   Socioeconomic History   Marital status: Divorced    Spouse name: Not on file   Number of children: Not on file   Years of education: Not on file   Highest education level: Not on file  Occupational History   Not on file  Tobacco Use   Smoking status: Never    Passive exposure: Never   Smokeless tobacco: Never  Vaping Use   Vaping Use: Never used  Substance and Sexual Activity   Alcohol use: Never   Drug use: Never   Sexual activity: Not on file  Other Topics Concern   Not on file  Social History Narrative   Not on file   Social Determinants of Health   Financial Resource Strain: Not on file  Food Insecurity: Not on file  Transportation Needs: Not  on file  Physical Activity: Not on file  Stress: Not on file  Social Connections: Not on file     Family History: The patient's family history includes AAA (abdominal aortic aneurysm) in his daughter; Diabetes in his daughter and sister; Hyperlipidemia in his daughter; Hypertension in his daughter and daughter; Migraines in his daughter; Rheum arthritis in his daughter. There is no history of Heart attack, Heart disease, or Heart failure. ROS:   Please see the history of present illness.  All other systems reviewed and are negative.  EKGs/Labs/Other Studies Reviewed:    The following studies were reviewed today:  He had and extensive cardiac evaluation 2023. 2D echo 04/01/21   1. Global mild to moderate hypokinesis with akinesis of the anterior,  anteroseptal wall and apex c/w LAD disease. Left ventricular ejection  fraction, by estimation, is 40 to 45%. The left ventricle has mildly  decreased function. The left ventricle  demonstrates regional wall motion abnormalities (see scoring  diagram/findings for description). There is mild concentric left  ventricular hypertrophy. Left ventricular diastolic parameters are  indeterminate.   2. Right ventricular systolic function was not well visualized. The right  ventricular size is not well visualized.   3. Left atrial size was moderately dilated.   4. Right atrium is dilated.   5. The mitral valve is grossly normal. No evidence of mitral valve  regurgitation.   6. The aortic valve is grossly normal. Aortic valve regurgitation is  mild. Aortic valve sclerosis/calcification is present, without any  evidence of aortic stenosis.   7. The inferior vena cava not well visualized.     Lexiscan Myoview 04/07/21:  IMPRESSION: 1. Large fixed defects are seen involving the anterior, apical and inferior myocardium consistent with old infarctions. Mild peri-infarct ischemia is noted anteriorly.   2. Moderate global hypomotility is noted.    3. Left ventricular ejection fraction 34%   4. Non invasive risk stratification*: High   Abdominal aortogram 04/04/21: Findings: Patient has tortuous external iliac arteries.  The common iliac and aorta are calcified.  Renal arteries without any stenoses.  Right SFA is flush occluded there is a large profunda giving many collaterals.  There is an [place] of distal SFA.  He then reconstitutes intertibial artery which flows to the foot.  Posterior tibial artery appears to reconstitute but it occludes above the ankle.  On the left side his common femoral artery is calcified without any flow-limiting stenosis.  SFA is heavily calcified but patent down to the popliteal.  Trifurcation is patent.  All tibial vessels are heavily diseased with posterior tibial being the dominant runoff   Left heart catheterization 04/11/2021: Conclusions: Severe native coronary artery disease, including chronic total occlusions of mid/distal LMCA, mid LAD (site of prior PCI), and ostial rPDA.  There is also irregular mid LAD disease beyond the occluded stent of up to 50%, sequential 30-70% proximal through distal LCx lesions, and sequential 80% proximal and 95% distal RCA stenoses. Widely patent LIMA-LAD. Patent SVG-ramus intermedius with occlusion of sequential portion of the graft to OM3.  SVG-ramus backfills the LCx and proximal LAD. Chronically occluded SVG-D1. Mildly elevated left ventricular filling pressure (LVEDP 18 mmHg).   Recommendations: Images reviewed with Dr. Gardiner Rhyme.  Given lack of angina and minimal periinfarct ischemia on recent stress test, medical management is preferred.  I doubt that PCI to proximal and distal RCA would significantly alter the patient's perioperative cardiac risk for planned fem-tib bypass tomorrow but could delay intervention aimed at treating his critical limb ischemia. If Mr. Raygoza develops angina, PCI to proximal and distal RCA could be considered. Aggressive secondary prevention.   PCSK9 inhibitor should be considered as an outpatient, given history of statin intolerance.     Recent Labs: 04/17/2022: BUN 27; Creatinine, Ser 1.30; NT-Pro BNP 3,519; Potassium 4.7; Sodium 139  Recent Lipid Panel    Component Value Date/Time   CHOL 83 04/13/2021 0212   CHOL 151 07/08/2019 0932   TRIG 39 04/13/2021 0212   HDL  27 (L) 04/13/2021 0212   HDL 43 07/08/2019 0932   CHOLHDL 3.1 04/13/2021 0212   VLDL 8 04/13/2021 0212   LDLCALC 48 04/13/2021 0212   LDLCALC 88 07/08/2019 0932    Physical Exam:    VS:  BP 117/69 (BP Location: Left Arm, Patient Position: Sitting)   Pulse 68   Ht 5' 10"$  (1.778 m)   SpO2 99%   BMI 27.41 kg/m     Wt Readings from Last 3 Encounters:  05/30/21 191 lb (86.6 kg)  05/11/21 200 lb (90.7 kg)  04/14/21 198 lb (89.8 kg)     GEN: Short of breath at rest and wheezing on room well nourished, well developed in no acute distress HEENT: Normal NECK: He has marked JVD with hepatojugular reflux; No carotid bruits LYMPHATICS: No lymphadenopathy CARDIAC: Distant heart sounds RRR, no murmurs, rubs, gallops RESPIRATORY: Markedly diminished breath sounds bilaterally as suspect he has large pleural effusions decreased breath sounds ABDOMEN: Soft, non-tender, non-distended MUSCULOSKELETAL: He is above the umbilicus whole-body edema anasarca no deformity  SKIN: Warm and dry NEUROLOGIC:  Alert and oriented x 3 PSYCHIATRIC:  Normal affect   Seen with Jacobo Forest RN chaperone   Signed, Shirlee More, MD  04/24/2022 3:02 PM    [place]

## 2022-04-24 ENCOUNTER — Encounter: Payer: Self-pay | Admitting: Cardiology

## 2022-04-24 ENCOUNTER — Ambulatory Visit: Payer: Medicare Other | Attending: Cardiology | Admitting: Cardiology

## 2022-04-24 VITALS — BP 117/69 | HR 68 | Ht 70.0 in

## 2022-04-24 DIAGNOSIS — Z950 Presence of cardiac pacemaker: Secondary | ICD-10-CM | POA: Diagnosis not present

## 2022-04-24 DIAGNOSIS — I5042 Chronic combined systolic (congestive) and diastolic (congestive) heart failure: Secondary | ICD-10-CM

## 2022-04-24 DIAGNOSIS — I255 Ischemic cardiomyopathy: Secondary | ICD-10-CM

## 2022-04-24 DIAGNOSIS — I25119 Atherosclerotic heart disease of native coronary artery with unspecified angina pectoris: Secondary | ICD-10-CM

## 2022-04-24 DIAGNOSIS — E782 Mixed hyperlipidemia: Secondary | ICD-10-CM

## 2022-04-24 NOTE — Progress Notes (Signed)
Accompanied physician for entire patient appointment.  Suann Larry. Kenton Kingfisher, RN

## 2022-04-25 DIAGNOSIS — I2510 Atherosclerotic heart disease of native coronary artery without angina pectoris: Secondary | ICD-10-CM | POA: Diagnosis not present

## 2022-04-25 DIAGNOSIS — I509 Heart failure, unspecified: Secondary | ICD-10-CM | POA: Diagnosis not present

## 2022-04-25 DIAGNOSIS — I10 Essential (primary) hypertension: Secondary | ICD-10-CM

## 2022-04-25 DIAGNOSIS — E7800 Pure hypercholesterolemia, unspecified: Secondary | ICD-10-CM

## 2022-04-25 DIAGNOSIS — Z950 Presence of cardiac pacemaker: Secondary | ICD-10-CM | POA: Diagnosis not present

## 2022-04-25 DIAGNOSIS — I361 Nonrheumatic tricuspid (valve) insufficiency: Secondary | ICD-10-CM | POA: Diagnosis not present

## 2022-04-25 DIAGNOSIS — I739 Peripheral vascular disease, unspecified: Secondary | ICD-10-CM | POA: Diagnosis not present

## 2022-04-26 DIAGNOSIS — Z950 Presence of cardiac pacemaker: Secondary | ICD-10-CM | POA: Diagnosis not present

## 2022-04-26 DIAGNOSIS — I509 Heart failure, unspecified: Secondary | ICD-10-CM | POA: Diagnosis not present

## 2022-04-26 DIAGNOSIS — I4510 Unspecified right bundle-branch block: Secondary | ICD-10-CM | POA: Diagnosis not present

## 2022-04-26 DIAGNOSIS — I739 Peripheral vascular disease, unspecified: Secondary | ICD-10-CM | POA: Diagnosis not present

## 2022-04-26 DIAGNOSIS — I2510 Atherosclerotic heart disease of native coronary artery without angina pectoris: Secondary | ICD-10-CM | POA: Diagnosis not present

## 2022-04-26 NOTE — Telephone Encounter (Signed)
The patient daughter Ander Purpura states the monitor is at the nursing home. The patient is currently at the hospital. I asked the daughter when the patient get back from the hospital to have the nurse to give [place] a call so we can make sure the monitor is working properly.

## 2022-04-27 DIAGNOSIS — Z950 Presence of cardiac pacemaker: Secondary | ICD-10-CM | POA: Diagnosis not present

## 2022-04-27 DIAGNOSIS — I4510 Unspecified right bundle-branch block: Secondary | ICD-10-CM | POA: Diagnosis not present

## 2022-04-27 DIAGNOSIS — I2510 Atherosclerotic heart disease of native coronary artery without angina pectoris: Secondary | ICD-10-CM | POA: Diagnosis not present

## 2022-04-27 DIAGNOSIS — I739 Peripheral vascular disease, unspecified: Secondary | ICD-10-CM | POA: Diagnosis not present

## 2022-04-27 DIAGNOSIS — I509 Heart failure, unspecified: Secondary | ICD-10-CM | POA: Diagnosis not present

## 2022-04-28 DIAGNOSIS — I509 Heart failure, unspecified: Secondary | ICD-10-CM | POA: Diagnosis not present

## 2022-04-28 DIAGNOSIS — I739 Peripheral vascular disease, unspecified: Secondary | ICD-10-CM | POA: Diagnosis not present

## 2022-04-28 DIAGNOSIS — I2510 Atherosclerotic heart disease of native coronary artery without angina pectoris: Secondary | ICD-10-CM | POA: Diagnosis not present

## 2022-04-28 DIAGNOSIS — Z950 Presence of cardiac pacemaker: Secondary | ICD-10-CM | POA: Diagnosis not present

## 2022-05-02 NOTE — Telephone Encounter (Signed)
Just a fyi. This patient monitor is now working properly. Transmission received 05/02/2022.

## 2022-05-04 ENCOUNTER — Encounter: Payer: Self-pay | Admitting: Internal Medicine

## 2022-05-20 NOTE — Progress Notes (Signed)
Cardiology Office Note:    Date:  05/22/2022   ID:  [name], DOB 1933-05-23, MRN [id number]  PCP:  Gordan Payment., MD  Cardiologist:  Norman Herrlich, MD    Referring MD: Gordan Payment., MD    ASSESSMENT:    1. Chronic combined systolic and diastolic congestive heart failure (HCC)   2. Ischemic cardiomyopathy   3. Ventricular tachycardia (HCC)   4. Presence of cardiac pacemaker   5. Hypertensive heart disease with heart failure (HCC)   6. Mixed hyperlipidemia   7. Statin intolerance    PLAN:    In order of problems listed above:  Unfortunately he has not improved after his recent hospitalization he wishes to avoid repeat and try to maximize compliance and diuresis to see if we can impact the quality of his life check to see if he qualifies for nocturnal oxygen decrease and discontinue meds contributing to hypotension and I asked the patient and his daughter to consider the merits of palliative care. Follow-up 6 weeks in my office with me or my nurse practitioner   Next appointment: 6-8 weeks   Medication Adjustments/Labs and Tests Ordered: Current medicines are reviewed at length with the patient today.  Concerns regarding medicines are outlined above.  No orders of the defined types were placed in this encounter.  No orders of the defined types were placed in this encounter.  Follow-up heart failure recent hospitalization   History of Present Illness:    [name] is a 89 y.o. male with a hx of heart failure systolic most recent ejection fraction 34% ischemic cardiomyopathy coronary artery disease cardiac pacemaker hyperlipidemia with statin intolerance.  Last seen 04/24/2022 with severely decompensated heart failure with anasarca shortness of breath at rest and directly admitted to the hospital for inpatient treatment.Another echocardiogram done at [facility] 04/25/2022 showed EF better 45 to 50% he had elevated filling pressures were a restrictive pattern of  diastolic dysfunction moderate left atrial enlargement and mild elevation of pulmonary artery pressure.Most recent laboratory studies showed mild anemia hemoglobin 11.0 platelets 141,000 creatinine 1.0 potassium 3.8.  As noted he had nonsustained ventricular tachycardia during this admission to the hospital.  Admission proBNP level severely elevated at 4690.  Is difficult to judge the extent of diuresis but the note from my partner said that he had a negative INO with about 2 L each day in the hospital which would have translated to about 6 L of net diuresis.  Compliance with diet, lifestyle and medications: No his daughter thinks he is not taking his diuretic regularly  He is not pleased with the quality of his life he says it is very difficult to get to the bathroom and to find one that is not occupied His legs are edematous due to bruising and he has anasarca with edema above the umbilicus Surprising although he has audible wheezing without a stethoscope in the office he says he is not short of breath his oxygen saturation is 94% Overall he is displeased with the quality of his life to raise the issue of palliative care hospice but he is not ready to make that decision He prefers not to be in the hospital it did not accomplish much last time so the plan is to do here is change his diuretic once a day torsemide 40 mg for compliance add Zaroxolyn  5 mg with his torsemide every other day see if he can qualify for oxygen using a nocturnal oxygen sat monitor and  see back in the office in 6 weeks. He is hypotensive reduce his Coreg to 6.25 mg twice daily and discontinue cyclobenzaprine which can cause marked orthostatic hypotension Will continue his other cardiac medications including spironolactone 25 mg daily losartan 25 mg/day aspirin 81 mg daily. Past Medical History:  Diagnosis Date   Abnormal nuclear cardiac imaging test    Abnormal nuclear stress test 09/15/2016   Anticoagulant long-term use  05/05/2015   Aortic atherosclerosis (HCC) 02/16/2016   Benign paroxysmal vertigo of both ears 07/28/2019   BMI 31.0-31.9,adult 05/05/2015   CAD (coronary artery disease) with history of CABG 05/05/2015   Cellulitis and abscess of foot 03/31/2021   Cellulitis of left upper arm 01/18/2021   Chronic combined systolic and diastolic heart failure (HCC) 03/31/2021   Chronic rhinitis 05/05/2015   Critical limb ischemia of both lower extremities (HCC)    Dizziness 10/05/2016   Elevated PSA 05/05/2015   [name]   GERD (gastroesophageal reflux disease) 05/05/2015   High risk medication use 05/05/2015   History of cardiac radiofrequency ablation 09/15/2016   History of small bowel obstruction 03/05/2017   Hx of CABG 09/15/2016   Hyperlipidemia 02/14/2015   Impacted cerumen, bilateral 04/24/2018   Ischemic cardiomyopathy    Lumbar degenerative disc disease 05/05/2015   Macular degeneration 03/31/2021   Malaise and fatigue 05/05/2015   Last Assessment & Plan:  Relevant Hx: Course: Daily Update: Today's Plan:he admits that this can be prominent for him and he had UA checked to ensure not having infection which he was concerned about and reassured him of this, encouraged him to remain active and his meds contribute to this as well which he is aware of   Electronically signed by: Krystal Clark, NP 08/12/15 1303  Last A   Old MI (myocardial infarction) 09/15/2016   PAD (peripheral artery disease) (HCC) 03/31/2021   PAF (paroxysmal atrial fibrillation) (HCC) 12/31/2017   Telemetry detected   Paroxysmal atrial flutter (HCC) 04/01/2021   Partial small bowel obstruction (HCC) 03/05/2017   Prediabetes 05/05/2015   Preoperative cardiovascular examination 02/14/2015   Presence of cardiac pacemaker    Biotronik   Primary osteoarthritis involving multiple joints 05/05/2015   Last Assessment & Plan:  Relevant Hx: Course: Daily Update: Today's Plan:he has known OA to his joints and he has seen chiropractor in the  past for this and he states that he was told he had degenerative changes to his back several times. The knee needs to be xrayed to the right with the swelling , he is taking the oxycodone only as needed but it works well he does not feel dizzy whereas the aleve   Rash 04/09/2021   RBBB with left anterior fascicular block 02/14/2015   Recurrent incisional hernia 03/05/2017   Sick sinus syndrome (HCC) status post pacemaker 02/17/2015   Thrombocytopenia due to drugs 07/16/2017   Typical atrial flutter (HCC) 05/05/2015   Last Assessment & Plan:  Warfarin was stopped September 07, 2016 by physicians assistant or [place] [facility] after consistent monitoring showed no recurrence of A. fib for more than 1-1/2 years   Ulcer of right foot, limited to breakdown of skin (HCC) 05/20/2020   Urinary hesitancy 04/01/2021   Ventral hernia without obstruction or gangrene 05/05/2015    Past Surgical History:  Procedure Laterality Date   ABDOMINAL AORTOGRAM W/LOWER EXTREMITY Bilateral 04/04/2021   Procedure: ABDOMINAL AORTOGRAM W/LOWER EXTREMITY;  Surgeon: Maeola Harman, MD;  Location: [facility] INVASIVE CV LAB;  Service: Cardiovascular;  Laterality: Bilateral;  ATRIAL FLUTTER ABLATION     COLON SURGERY     CORONARY ARTERY BYPASS GRAFT     FEMORAL-TIBIAL BYPASS GRAFT Right 04/12/2021   Procedure: RIGHT FEMORAL- ANTERIOR TIBIAL ARTERY BYPASS;  Surgeon: Maeola Harman, MD;  Location: [facility] OR;  Service: Vascular;  Laterality: Right;   INSERT / REPLACE / REMOVE PACEMAKER     LEFT HEART CATH AND CORS/GRAFTS ANGIOGRAPHY N/A 04/11/2021   Procedure: LEFT HEART CATH AND CORS/GRAFTS ANGIOGRAPHY;  Surgeon: Yvonne Kendall, MD;  Location: [facility] INVASIVE CV LAB;  Service: Cardiovascular;  Laterality: N/A;    Current Medications: Current Meds  Medication Sig   acetaminophen (TYLENOL) 325 MG tablet Take 3 tablets by mouth every 8 (eight) hours as needed for mild pain or moderate pain.   ammonium lactate (AMLACTIN)  12 % cream Apply 1 application. topically daily.   aspirin EC 81 MG tablet Take 81 mg by mouth daily.   citalopram (CELEXA) 20 MG tablet Take 20 mg by mouth at bedtime.   hydroxypropyl methylcellulose / hypromellose (ISOPTO TEARS / GONIOVISC) 2.5 % ophthalmic solution Place 1 drop into both eyes daily as needed for dry eyes.   ipratropium-albuterol (DUONEB) 0.5-2.5 (3) MG/3ML SOLN Take 3 mLs by nebulization every 4 (four) hours as needed (wheezing).   losartan (COZAAR) 25 MG tablet Take 25 mg by mouth daily.   Magnesium Hydroxide (MILK OF MAGNESIA PO) Take 30 mLs by mouth as needed (constipation).   melatonin 3 MG TABS tablet Take 3 mg by mouth at bedtime as needed (insomnia).   omeprazole (PRILOSEC) 20 MG capsule Take 20 mg by mouth daily.   ondansetron (ZOFRAN) 4 MG tablet Take 4 mg by mouth every 4 (four) hours as needed for nausea or vomiting.   Oxycodone HCl 10 MG TABS Take 10 mg by mouth every 6 (six) hours as needed for severe pain.   Polyethylene Glycol 1450 POWD Take 17 g by mouth 2 (two) times daily.   senna (SENOKOT) 8.6 MG tablet Take 2 tablets by mouth at bedtime.   spironolactone (ALDACTONE) 25 MG tablet Take 25 mg by mouth daily.   tamsulosin (FLOMAX) 0.4 MG CAPS capsule Take 0.4 mg by mouth daily.   torsemide (DEMADEX) 20 MG tablet Take 20 mg by mouth 2 (two) times daily.   [DISCONTINUED] carvedilol (COREG) 12.5 MG tablet Take 12.5 mg by mouth 2 (two) times daily with a meal.   [DISCONTINUED] cyclobenzaprine (FLEXERIL) 5 MG tablet Take 5 mg by mouth 3 (three) times daily as needed for muscle spasms.     Allergies:   Amiodarone, Gabapentin, Metoprolol, Statins, Doxycycline, Neomycin, Niacin, Ramipril, Aleve [naproxen], and Rocephin [ceftriaxone]   Social History   Socioeconomic History   Marital status: Divorced    Spouse name: Not on file   Number of children: Not on file   Years of education: Not on file   Highest education level: Not on file  Occupational History    Not on file  Tobacco Use   Smoking status: Never    Passive exposure: Never   Smokeless tobacco: Never  Vaping Use   Vaping Use: Never used  Substance and Sexual Activity   Alcohol use: Never   Drug use: Never   Sexual activity: Not on file  Other Topics Concern   Not on file  Social History Narrative   Not on file   Social Determinants of Health   Financial Resource Strain: Not on file  Food Insecurity: Not on file  Transportation Needs: Not  on file  Physical Activity: Not on file  Stress: Not on file  Social Connections: Not on file     Family History: The patient's family history includes AAA (abdominal aortic aneurysm) in his daughter; Diabetes in his daughter and sister; Hyperlipidemia in his daughter; Hypertension in his daughter and daughter; Migraines in his daughter; Rheum arthritis in his daughter. There is no history of Heart attack, Heart disease, or Heart failure. ROS:   Please see the history of present illness.    All other systems reviewed and are negative.  EKGs/Labs/Other Studies Reviewed:    The following studies were reviewed today:  Cardiac Studies & Procedures   CARDIAC CATHETERIZATION  CARDIAC CATHETERIZATION 04/11/2021  Narrative Conclusions: Severe native coronary artery disease, including chronic total occlusions of mid/distal LMCA, mid LAD (site of prior PCI), and ostial rPDA.  There is also irregular mid LAD disease beyond the occluded stent of up to 50%, sequential 30-70% proximal through distal LCx lesions, and sequential 80% proximal and 95% distal RCA stenoses. Widely patent LIMA-LAD. Patent SVG-ramus intermedius with occlusion of sequential portion of the graft to OM3.  SVG-ramus backfills the LCx and proximal LAD. Chronically occluded SVG-D1. Mildly elevated left ventricular filling pressure (LVEDP 18 mmHg).  Recommendations: Images reviewed with Dr. Bjorn Pippin.  Given lack of angina and minimal periinfarct ischemia on recent stress  test, medical management is preferred.  I doubt that PCI to proximal and distal RCA would significantly alter the patient's perioperative cardiac risk for planned fem-tib bypass tomorrow but could delay intervention aimed at treating his critical limb ischemia. If Mr. Annen develops angina, PCI to proximal and distal RCA could be considered. Aggressive secondary prevention.  PCSK9 inhibitor should be considered as an outpatient, given history of statin intolerance.  Yvonne Kendall, MD [facility]  Findings Coronary Findings Diagnostic  Dominance: Right  Left Main Mid LM to Dist LM lesion is 100% stenosed. The lesion is chronically occluded.  Left Anterior Descending Mid LAD-1 lesion is 100% stenosed. The lesion is chronically occluded. The lesion was previously treated using a bare metal stent over 2 years ago. Previously placed stent displays restenosis. Mid LAD-2 lesion is 50% stenosed.  Left Circumflex Prox Cx lesion is 30% stenosed. Mid Cx lesion is 70% stenosed. Dist Cx lesion is 50% stenosed.  First Obtuse Marginal Branch Vessel is small in size. There is mild disease in the vessel.  Second Obtuse Marginal Branch Vessel is small in size. There is mild disease in the vessel.  Third Obtuse Marginal Branch Vessel is moderate in size.  Right Coronary Artery Vessel is large. Prox RCA lesion is 80% stenosed. Dist RCA lesion is 95% stenosed.  Right Posterior Descending Artery Vessel is moderate in size. Collaterals RPDA filled by collaterals from 1st RPL.  RPDA lesion is 100% stenosed. The lesion is chronically occluded.  Right Posterior Atrioventricular Artery Vessel is moderate in size.  First Right Posterolateral Branch Vessel is moderate in size.  Sequential Saphenous Graft To Ramus, 3rd Mrg SVG graft was visualized by angiography and is large. Origin to Dist Graft lesion between Ramus and 3rd Mrg  is 100% stenosed.  Graft To 1st Diag Origin to Dist Graft  lesion is 100% stenosed. The lesion is chronically occluded.  LIMA LIMA Graft To Dist LAD LIMA graft was visualized by angiography and is normal in caliber.  The graft exhibits no disease.  Intervention  No interventions have been documented.   STRESS TESTS  NM MYOCAR MULTI W/SPECT W 04/07/2021  Narrative CLINICAL DATA:  Preop for noncardiac surgery.  Chest pain.  EXAM: MYOCARDIAL IMAGING WITH SPECT (REST AND PHARMACOLOGIC-STRESS)  GATED LEFT VENTRICULAR WALL MOTION STUDY  LEFT VENTRICULAR EJECTION FRACTION  TECHNIQUE: Standard myocardial SPECT imaging was performed after resting intravenous injection of 10.3 mCi Tc-84m tetrofosmin. Subsequently, intravenous infusion of Lexiscan was performed under the supervision of the Cardiology staff. At peak effect of the drug, 30.1 mCi Tc-39m tetrofosmin was injected intravenously and standard myocardial SPECT imaging was performed. Quantitative gated imaging was also performed to evaluate left ventricular wall motion, and estimate left ventricular ejection fraction.  COMPARISON:  None.  FINDINGS: Perfusion: Large fixed defects are seen involving the anterior, apical and inferior myocardium consistent with old infarctions. Mild reversibility is seen involving the margins of the defect in the anterior myocardium consistent with peri-infarct ischemia.  Wall Motion: Moderate global hypomotility is noted.  Left Ventricular Ejection Fraction: 34 %  End diastolic volume 177 ml  End systolic volume 117 ml  IMPRESSION: 1. Large fixed defects are seen involving the anterior, apical and inferior myocardium consistent with old infarctions. Mild peri-infarct ischemia is noted anteriorly.  2. Moderate global hypomotility is noted.  3. Left ventricular ejection fraction 34%  4. Non invasive risk stratification*: High  *2012 Appropriate Use Criteria for Coronary Revascularization Focused Update: J Am Coll Cardiol.  2012;59(9):857-881. http://content.dementiazones.com.aspx?articleid=1201161   Electronically Signed By: Lupita Raider M.D. On: 04/07/2021 15:46   ECHOCARDIOGRAM  ECHOCARDIOGRAM COMPLETE 04/01/2021  Narrative ECHOCARDIOGRAM REPORT    Patient Name:   [name] Date of Exam: 04/01/2021 Medical Rec #:  [id number]     Height:       70.0 in Accession #:    [id number]    Weight:       185.0 lb Date of Birth:  Jun 02, 1933      BSA:          2.019 m Patient Age:    89 years      BP:           158/89 mmHg Patient Gender: M             HR:           66 bpm. Exam Location:  Inpatient  Procedure: 2D Echo, Cardiac Doppler, Color Doppler and Intracardiac Opacification Agent  Indications:    R94.31 Abnormal EKG  History:        Patient has no prior history of Echocardiogram examinations. CHF, CAD and Previous Myocardial Infarction, Arrythmias:RBBB and Atrial Flutter; Risk Factors:Hypertension.  Sonographer:    Vanetta Shawl Referring Phys: [id number] Daylene Katayama [facility]   Sonographer Comments: Suboptimal apical window. IMPRESSIONS   1. Global mild to moderate hypokinesis with akinesis of the anterior, anteroseptal wall and apex c/w LAD disease. Left ventricular ejection fraction, by estimation, is 40 to 45%. The left ventricle has mildly decreased function. The left ventricle demonstrates regional wall motion abnormalities (see scoring diagram/findings for description). There is mild concentric left ventricular hypertrophy. Left ventricular diastolic parameters are indeterminate. 2. Right ventricular systolic function was not well visualized. The right ventricular size is not well visualized. 3. Left atrial size was moderately dilated. 4. Right atrium is dilated. 5. The mitral valve is grossly normal. No evidence of mitral valve regurgitation. 6. The aortic valve is grossly normal. Aortic valve regurgitation is mild. Aortic valve sclerosis/calcification is present, without any evidence  of aortic stenosis. 7. The inferior vena cava not well visualized.  Comparison(s): No prior Echocardiogram.  FINDINGS Left  Ventricle: Global mild to moderate hypokinesis with akinesis of the anterior, anteroseptal wall and apex c/w LAD disease. Left ventricular ejection fraction, by estimation, is 40 to 45%. The left ventricle has mildly decreased function. The left ventricle demonstrates regional wall motion abnormalities. Definity contrast agent was given IV to delineate the left ventricular endocardial borders. The left ventricular internal cavity size was normal in size. There is mild concentric left ventricular hypertrophy. Left ventricular diastolic parameters are indeterminate.  Right Ventricle: The right ventricular size is not well visualized. Right vetricular wall thickness was not well visualized. Right ventricular systolic function was not well visualized.  Left Atrium: Left atrial size was moderately dilated.  Right Atrium: Right atrium is dilated.  Pericardium: There is no evidence of pericardial effusion.  Mitral Valve: The mitral valve is grossly normal. No evidence of mitral valve regurgitation. MV peak gradient, 2.9 mmHg. The mean mitral valve gradient is 1.0 mmHg.  Tricuspid Valve: The tricuspid valve is grossly normal. Tricuspid valve regurgitation is trivial.  Aortic Valve: The aortic valve is grossly normal. Aortic valve regurgitation is mild. Aortic valve sclerosis/calcification is present, without any evidence of aortic stenosis. Aortic valve mean gradient measures 4.0 mmHg. Aortic valve peak gradient measures 7.5 mmHg. Aortic valve area, by VTI measures 2.32 cm.  Pulmonic Valve: The pulmonic valve was grossly normal. Pulmonic valve regurgitation is not visualized.  Aorta: The aortic root and ascending aorta are structurally normal, with no evidence of dilitation.  Venous: The inferior vena cava not well visualized.  IAS/Shunts: The interatrial septum was not  well visualized.   LEFT VENTRICLE PLAX 2D LVIDd:         4.50 cm      Diastology LVIDs:         3.40 cm      LV e' medial:    4.13 cm/s LV PW:         1.10 cm      LV E/e' medial:  18.5 LV IVS:        1.10 cm      LV e' lateral:   6.45 cm/s LVOT diam:     2.00 cm      LV E/e' lateral: 11.8 LV SV:         57 LV SV Index:   28 LVOT Area:     3.14 cm  LV Volumes (MOD) LV vol d, MOD A2C: 156.0 ml LV vol d, MOD A4C: 164.0 ml LV vol s, MOD A2C: 76.4 ml LV vol s, MOD A4C: 91.1 ml LV SV MOD A2C:     79.6 ml LV SV MOD A4C:     164.0 ml LV SV MOD BP:      76.3 ml  RIGHT VENTRICLE RV S prime:     7.65 cm/s  LEFT ATRIUM             Index LA diam:        5.10 cm 2.53 cm/m LA Vol (A2C):   64.0 ml 31.69 ml/m LA Vol (A4C):   73.6 ml 36.45 ml/m LA Biplane Vol: 72.5 ml 35.90 ml/m AORTIC VALVE                    PULMONIC VALVE AV Area (Vmax):    2.19 cm     PV Vmax:       1.00 m/s AV Area (Vmean):   2.19 cm     PV Peak grad:  4.0 mmHg AV Area (VTI):  2.32 cm AV Vmax:           137.00 cm/s AV Vmean:          89.900 cm/s AV VTI:            0.246 m AV Peak Grad:      7.5 mmHg AV Mean Grad:      4.0 mmHg LVOT Vmax:         95.30 cm/s LVOT Vmean:        62.700 cm/s LVOT VTI:          0.182 m LVOT/AV VTI ratio: 0.74  AORTA Ao Root diam: 3.50 cm Ao Asc diam:  3.40 cm  MITRAL VALVE               TRICUSPID VALVE MV Area (PHT): 4.31 cm    TR Peak grad:   30.9 mmHg MV Area VTI:   2.13 cm    TR Vmax:        278.00 cm/s MV Peak grad:  2.9 mmHg MV Mean grad:  1.0 mmHg    SHUNTS MV Vmax:       0.85 m/s    Systemic VTI:  0.18 m MV Vmean:      48.0 cm/s   Systemic Diam: 2.00 cm MV Decel Time: 176 msec MV E velocity: 76.30 cm/s MV A velocity: 73.70 cm/s MV E/A ratio:  1.04  Photographer signed by Carolan Clines Signature Date/Time: 04/01/2021/10:20:26 AM    Final             Recent Labs: 04/17/2022: BUN 27; Creatinine, Ser 1.30; NT-Pro BNP 3,519; Potassium 4.7;  Sodium 139  Recent Lipid Panel    Component Value Date/Time   CHOL 83 04/13/2021 0212   CHOL 151 07/08/2019 0932   TRIG 39 04/13/2021 0212   HDL 27 (L) 04/13/2021 0212   HDL 43 07/08/2019 0932   CHOLHDL 3.1 04/13/2021 0212   VLDL 8 04/13/2021 0212   LDLCALC 48 04/13/2021 0212   LDLCALC 88 07/08/2019 0932    Physical Exam:    VS:  BP (!) 91/46 (BP Location: Left Arm, Patient Position: Sitting)   Pulse 62   Ht 5\' 10"  (1.778 m)   Wt 210 lb (95.3 kg)   SpO2 94%   BMI 30.13 kg/m     Wt Readings from Last 3 Encounters:  05/22/22 210 lb (95.3 kg)  05/30/21 191 lb (86.6 kg)  05/11/21 200 lb (90.7 kg)     GEN: He looks very chronically ill he has anasarca he appears breathless and he has audible wheezing without a stethoscope  Well developed in no acute distress HEENT: Normal NECK: No JVD; No carotid bruits LYMPHATICS: No lymphadenopathy CARDIAC: Distant heart sounds S3 present RRR,  RESPIRATORY: Diffuse CAD creased breath sounds expiratory wheezing ABDOMEN: Soft, non-tender, non-distended MUSCULOSKELETAL: He has anasarca 4+ edema above the umbilicus edema; No deformity  SKIN: Warm and dry NEUROLOGIC:  Alert and oriented x 3 PSYCHIATRIC:  Normal affect    Signed, Norman Herrlich, MD  05/22/2022 3:36 PM    [facility]

## 2022-05-22 ENCOUNTER — Ambulatory Visit: Payer: Medicare Other | Attending: Cardiology | Admitting: Cardiology

## 2022-05-22 ENCOUNTER — Encounter: Payer: Self-pay | Admitting: Cardiology

## 2022-05-22 VITALS — BP 91/46 | HR 62 | Ht 70.0 in | Wt 210.0 lb

## 2022-05-22 DIAGNOSIS — I110 Hypertensive heart disease with heart failure: Secondary | ICD-10-CM

## 2022-05-22 DIAGNOSIS — I472 Ventricular tachycardia, unspecified: Secondary | ICD-10-CM | POA: Diagnosis not present

## 2022-05-22 DIAGNOSIS — I255 Ischemic cardiomyopathy: Secondary | ICD-10-CM

## 2022-05-22 DIAGNOSIS — Z950 Presence of cardiac pacemaker: Secondary | ICD-10-CM | POA: Diagnosis not present

## 2022-05-22 DIAGNOSIS — Z789 Other specified health status: Secondary | ICD-10-CM

## 2022-05-22 DIAGNOSIS — I5042 Chronic combined systolic (congestive) and diastolic (congestive) heart failure: Secondary | ICD-10-CM | POA: Diagnosis not present

## 2022-05-22 DIAGNOSIS — E782 Mixed hyperlipidemia: Secondary | ICD-10-CM

## 2022-05-22 MED ORDER — METOLAZONE 5 MG PO TABS: 5.0000 mg | ORAL_TABLET | ORAL | 3 refills | Status: DC

## 2022-05-22 MED ORDER — TORSEMIDE 40 MG PO TABS: 40.0000 mg | ORAL_TABLET | Freq: Every day | ORAL | 3 refills | Status: DC

## 2022-05-22 MED ORDER — CARVEDILOL 6.25 MG PO TABS: 6.2500 mg | ORAL_TABLET | Freq: Two times a day (BID) | ORAL | 3 refills | Status: DC

## 2022-05-22 NOTE — Patient Instructions (Signed)
Medication Instructions:  Your physician has recommended you make the following change in your medication:   STOP: Cyclobenzapine START: Coreg 6.25 mg twice daily START: Torsemide 40 mg daily START: Zaroxylin 5 mg every other day with Torsemide  *If you need a refill on your cardiac medications before your next appointment, please call your pharmacy*   Lab Work: None If you have labs (blood work) drawn today and your tests are completely normal, you will receive your results only by: [place] (if you have MyChart) OR A paper copy in the mail If you have any lab test that is abnormal or we need to change your treatment, we will call you to review the results.   Testing/Procedures: None   Follow-Up: At [facility], you and your health needs are our priority.  As part of our continuing mission to provide you with exceptional heart care, we have created designated Provider Care Teams.  These Care Teams include your primary Cardiologist (physician) and Advanced Practice Providers (APPs -  Physician Assistants and Nurse Practitioners) who all work together to provide you with the care you need, when you need it.  We recommend signing up for the patient portal called "MyChart".  Sign up information is provided on this After Visit Summary.  MyChart is used to connect with patients for Virtual Visits (Telemedicine).  Patients are able to view lab/test results, encounter notes, upcoming appointments, etc.  Non-urgent messages can be sent to your provider as well.   To learn more about what you can do with MyChart, go to [email or web address].    Your next appointment:   6 week(s)  Provider:   Venia Carbon, NP [facility])    Other Instructions Please arrange nocturnal O2 saturation to see if he qualifies for oxygen.  Needs a bedside commode.

## 2022-07-06 ENCOUNTER — Ambulatory Visit: Payer: Medicare Other | Admitting: Cardiology

## 2022-07-12 DEATH — deceased

## 2022-07-26 ENCOUNTER — Encounter (HOSPITAL_COMMUNITY): Payer: Medicare Other

## 2022-07-26 ENCOUNTER — Ambulatory Visit: Payer: Medicare Other

## 2023-08-07 IMAGING — CR DG TIBIA/FIBULA 2V*R*
4 series · 4 of 4 positions shown · non-contrast
Comparison: None.

CLINICAL DATA: Blister at surgical site

EXAM:
RIGHT TIBIA AND FIBULA - 2 VIEW

[tibia ap (1 of 2)]
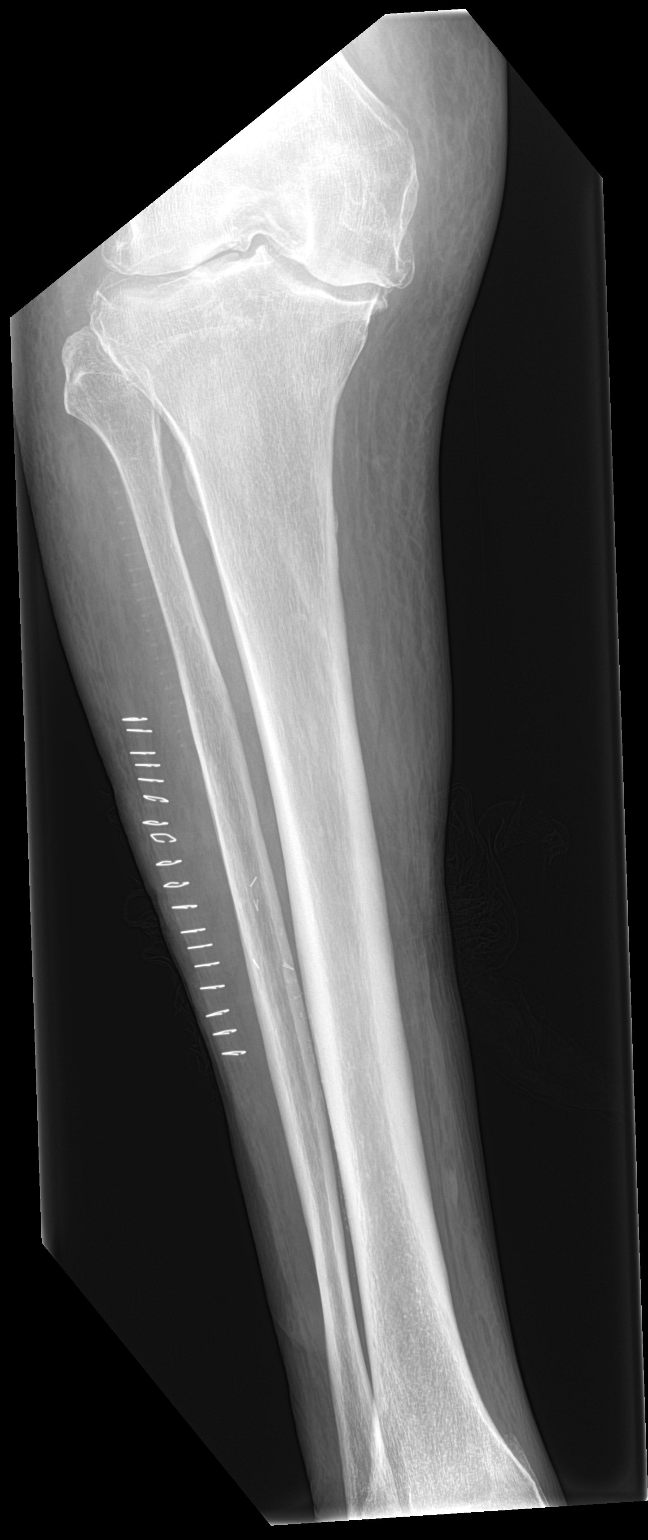

[tibia ap (2 of 2)]
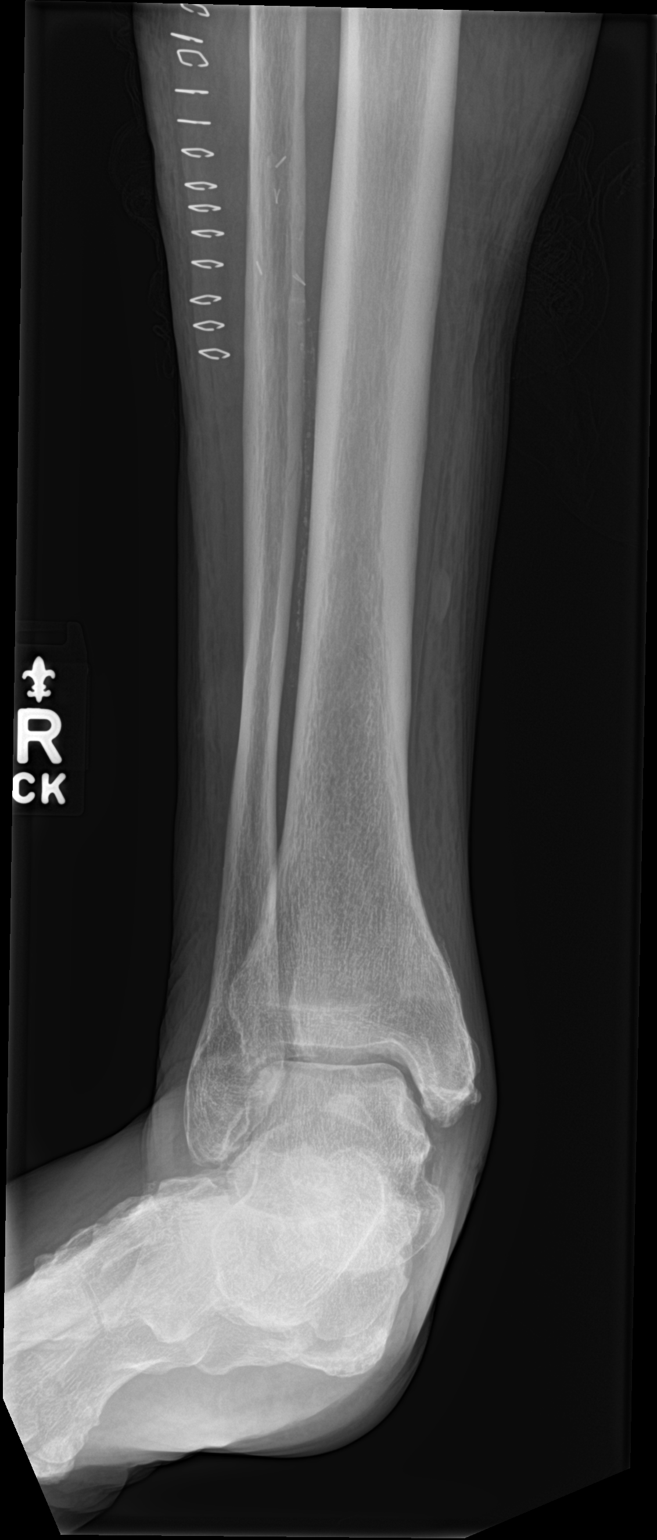

[tibia lat (1 of 2)]
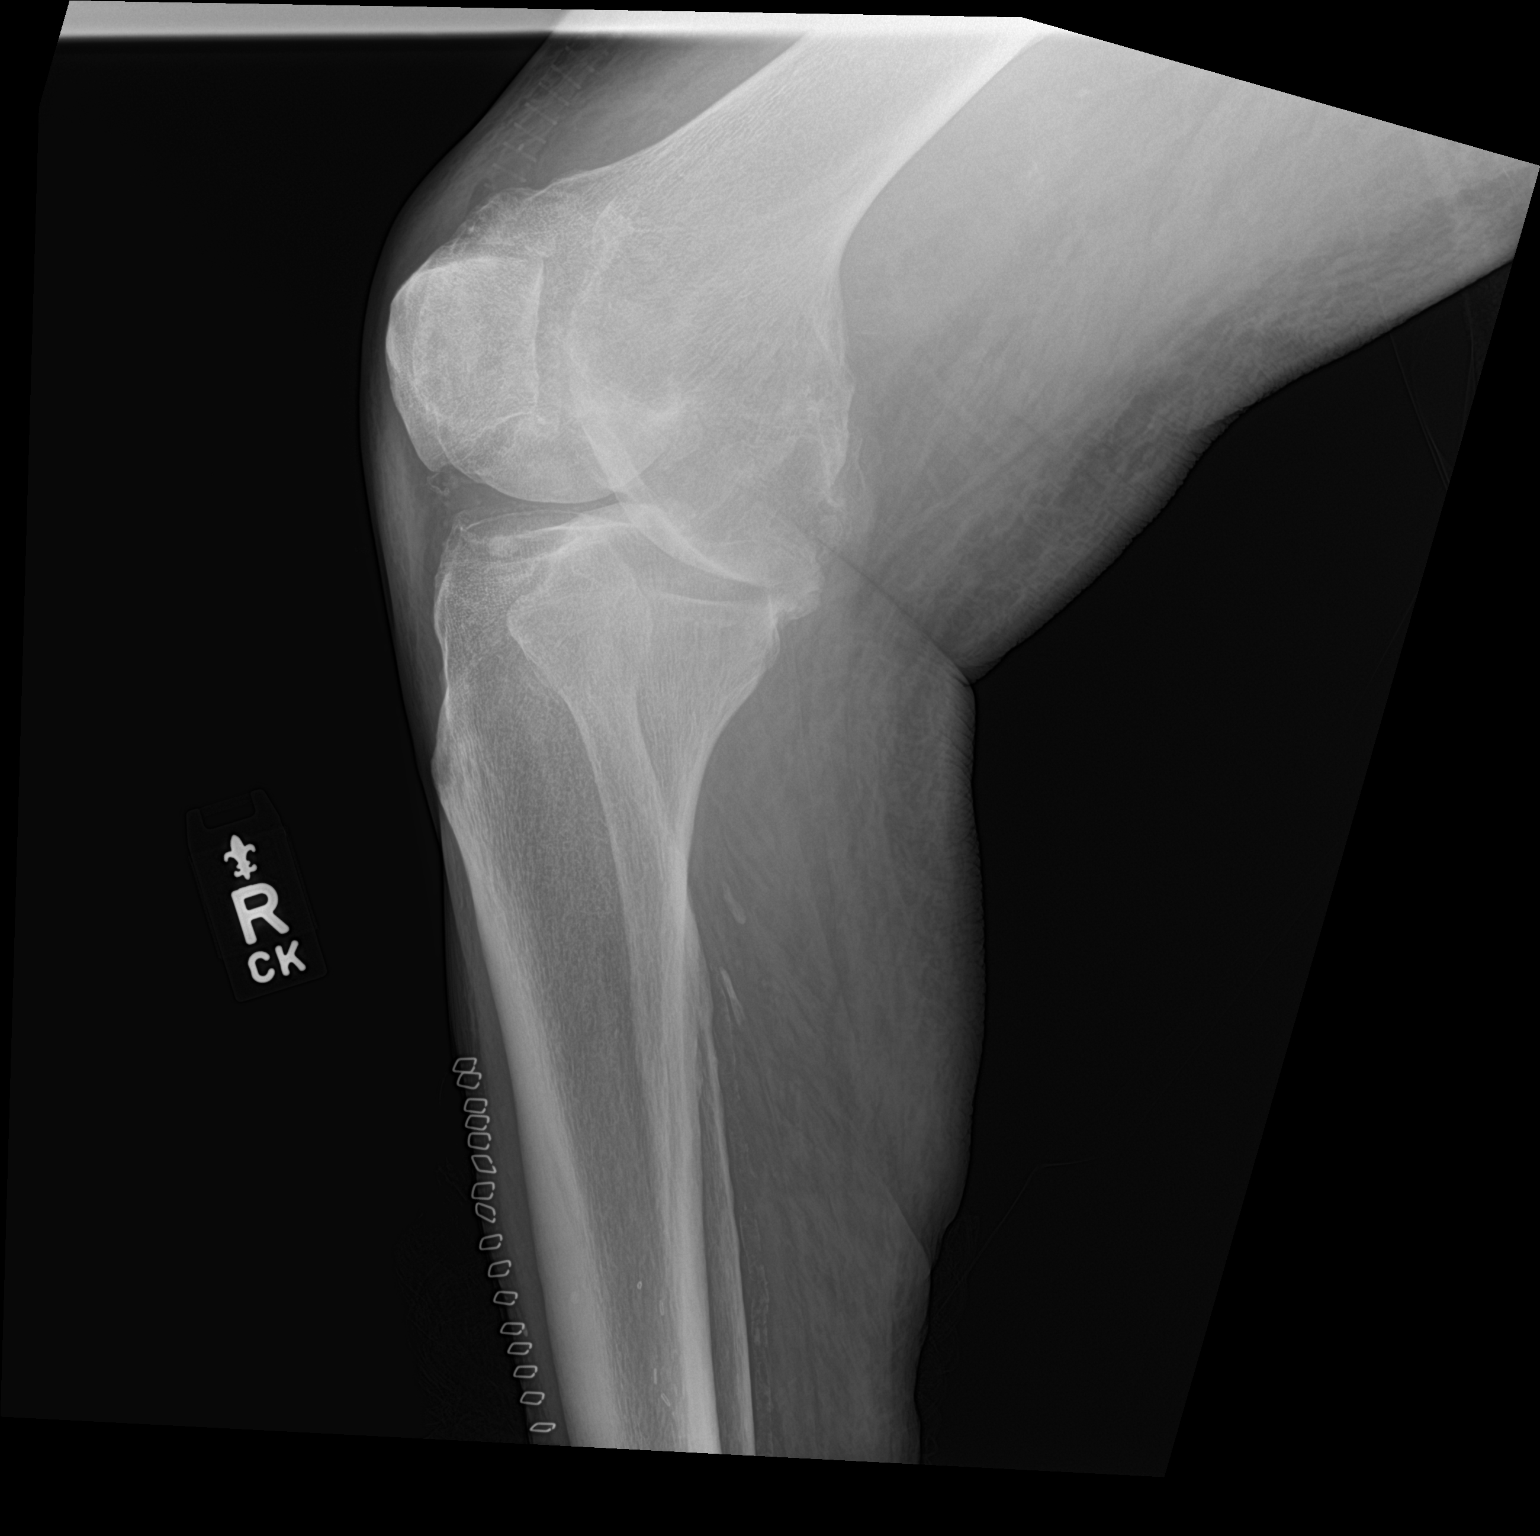

[tibia lat (2 of 2)]
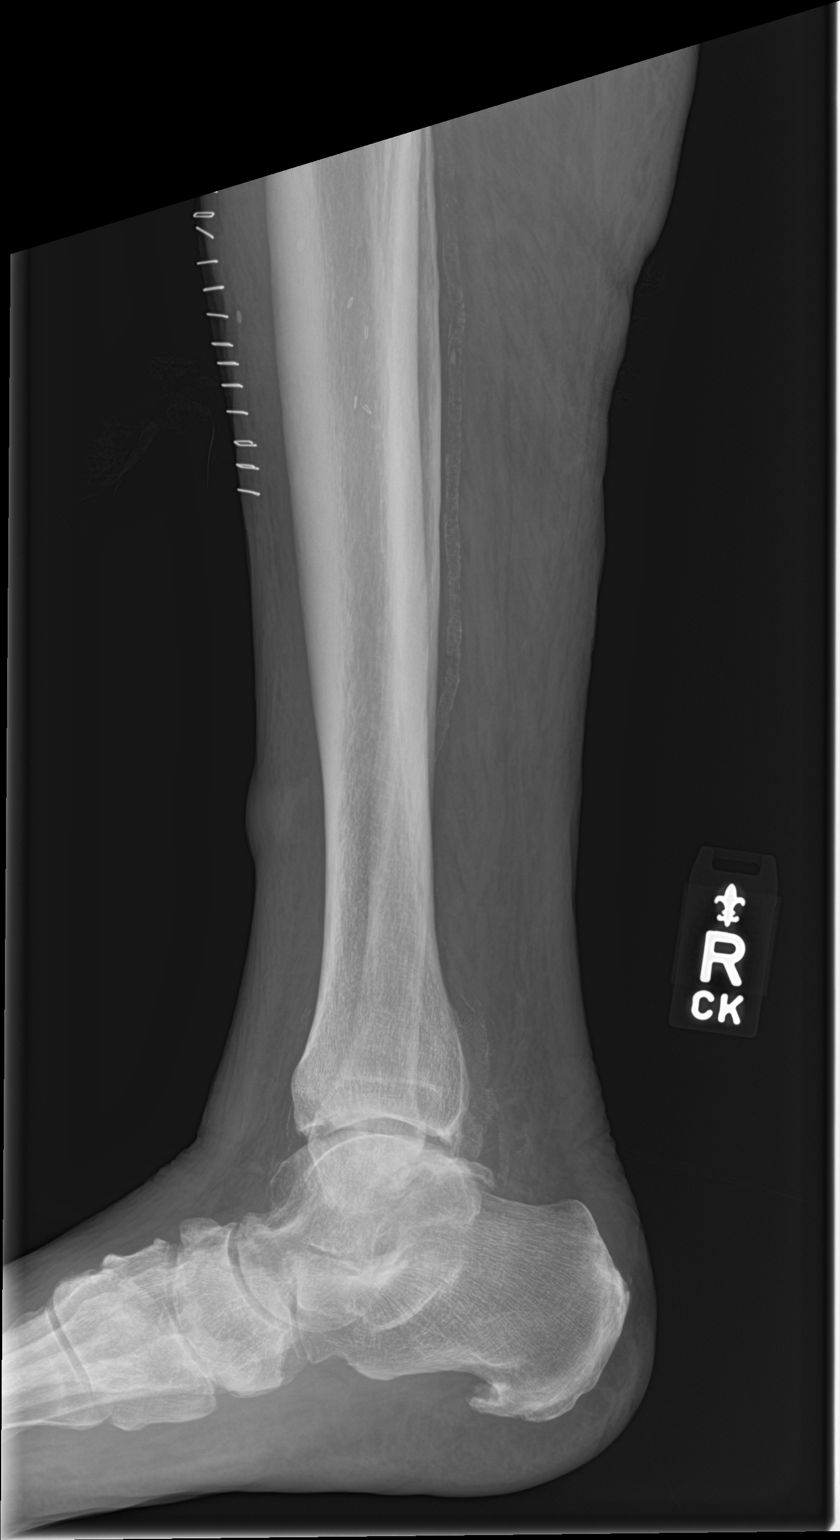

[4 of 4 positions shown; findings below may reference images not displayed]

FINDINGS: Frontal and lateral views of the right tibia and fibula are
obtained. There are surgical skin staples in the right lower leg.
Vascular bypass graft is seen within the right lateral calf. No
evidence of subcutaneous gas. There is diffuse subcutaneous edema.
No acute or destructive bony lesions. Moderate osteoarthritis of the
right knee and ankle.
IMPRESSION: 1. Postsurgical changes from prior vascular bypass procedure.
2. Diffuse subcutaneous edema.
3. Osteoarthritis of the right knee and ankle.
